# Patient Record
Sex: Female | Born: 1945 | Race: Black or African American | Hispanic: No | Smoking: Former smoker
Health system: Southern US, Community
[De-identification: ages and names within clinical notes are randomized; demographics above are authoritative.]

## PROBLEM LIST (undated history)

## (undated) DIAGNOSIS — I10 Essential (primary) hypertension: Secondary | ICD-10-CM

## (undated) DIAGNOSIS — E079 Disorder of thyroid, unspecified: Secondary | ICD-10-CM

## (undated) DIAGNOSIS — E119 Type 2 diabetes mellitus without complications: Secondary | ICD-10-CM

## (undated) DIAGNOSIS — E271 Primary adrenocortical insufficiency: Secondary | ICD-10-CM

## (undated) HISTORY — PX: EYE SURGERY: SHX253

---

## 1998-04-07 ENCOUNTER — Other Ambulatory Visit: Admission: RE | Admit: 1998-04-07 | Discharge: 1998-04-07 | Payer: Self-pay | Admitting: Ophthalmology

## 1998-11-02 ENCOUNTER — Emergency Department (HOSPITAL_COMMUNITY): Admission: EM | Admit: 1998-11-02 | Discharge: 1998-11-02 | Payer: Self-pay | Admitting: Emergency Medicine

## 1998-11-03 ENCOUNTER — Emergency Department (HOSPITAL_COMMUNITY): Admission: EM | Admit: 1998-11-03 | Discharge: 1998-11-03 | Payer: Self-pay | Admitting: Emergency Medicine

## 1998-11-04 ENCOUNTER — Encounter: Payer: Self-pay | Admitting: *Deleted

## 1998-11-04 ENCOUNTER — Ambulatory Visit (HOSPITAL_COMMUNITY): Admission: AD | Admit: 1998-11-04 | Discharge: 1998-11-05 | Payer: Self-pay | Admitting: *Deleted

## 1998-11-13 ENCOUNTER — Emergency Department (HOSPITAL_COMMUNITY): Admission: EM | Admit: 1998-11-13 | Discharge: 1998-11-13 | Payer: Self-pay | Admitting: Emergency Medicine

## 1998-11-16 ENCOUNTER — Emergency Department (HOSPITAL_COMMUNITY): Admission: EM | Admit: 1998-11-16 | Discharge: 1998-11-16 | Payer: Self-pay | Admitting: Emergency Medicine

## 2004-05-17 ENCOUNTER — Encounter (INDEPENDENT_AMBULATORY_CARE_PROVIDER_SITE_OTHER): Payer: Self-pay | Admitting: *Deleted

## 2004-05-17 ENCOUNTER — Inpatient Hospital Stay (HOSPITAL_COMMUNITY): Admission: EM | Admit: 2004-05-17 | Discharge: 2004-06-04 | Payer: Self-pay | Admitting: Emergency Medicine

## 2004-05-18 ENCOUNTER — Encounter (INDEPENDENT_AMBULATORY_CARE_PROVIDER_SITE_OTHER): Payer: Self-pay | Admitting: Cardiology

## 2004-09-28 ENCOUNTER — Inpatient Hospital Stay (HOSPITAL_COMMUNITY): Admission: EM | Admit: 2004-09-28 | Discharge: 2004-10-02 | Payer: Self-pay

## 2005-06-28 ENCOUNTER — Ambulatory Visit (HOSPITAL_COMMUNITY): Admission: RE | Admit: 2005-06-28 | Discharge: 2005-06-28 | Payer: Self-pay | Admitting: Ophthalmology

## 2007-08-23 ENCOUNTER — Encounter: Admission: RE | Admit: 2007-08-23 | Discharge: 2007-08-23 | Payer: Self-pay | Admitting: Family Medicine

## 2008-08-23 ENCOUNTER — Encounter: Admission: RE | Admit: 2008-08-23 | Discharge: 2008-08-23 | Payer: Self-pay | Admitting: Family Medicine

## 2009-08-25 ENCOUNTER — Encounter: Admission: RE | Admit: 2009-08-25 | Discharge: 2009-08-25 | Payer: Self-pay | Admitting: Family Medicine

## 2011-02-26 NOTE — Procedures (Signed)
CLINICAL INFORMATION:  This patient has a history of being in septic shock.  This is a portable EEG in an unresponsive patient.   TECHNICAL DESCRIPTION:  This EEG was recorded in an unresponsive patient and  is made primarily of muscle artifact and low voltage fast beta activity. Low  voltage fast beta activity is seen generally throughout this recording when  the artifact is not present. No well formed alpha. No theta or delta  activity is present. It is not clear whether the beta activity is reactive.  The patient was stimulated during this EEG, and there was no definite  evidence that the background activity reacted to the stimulation.   IMPRESSION:  This is an abnormal EEG with no normal awake background rhythms  present. The diffuse beta activity seems to be unresponsive to stimulation  during this recording. The findings above can be seen with multiple  etiologies including medication side effects, post ictal states. No focal  asymmetries and no definite epileptiform activity is seen.    Evie Lacks, M.D.   ZOX:WRUE  D:  05/19/2004 17:46:01  T:  05/20/2004 07:46:14  Job #:  [id number]

## 2011-02-26 NOTE — Consult Note (Signed)
NAMEAMEA, MCPHAIL                         ACCOUNT NO.:  [id number]   MEDICAL RECORD NO.:  [id number]                   PATIENT TYPE:  INP   LOCATION:  5524                                 FACILITY:  [facility]   PHYSICIAN:  Charlynne Pander, D.D.S.          DATE OF BIRTH:  1946-03-15   DATE OF CONSULTATION:  06/01/2004  DATE OF DISCHARGE:                                   CONSULTATION   [name] is a 65 year old female referred by Dr. Elliot Cousin for  dental consultation.  The patient was admitted previously in early August  with history of altered mental status.  The patient was subsequently found  to have sepsis and delirium secondary to the sepsis.  Dental consultation  has been requested to rule out dental infection as a possible source of the  sepsis at this time.   MEDICAL HISTORY:  1. Septic shock, unknown etiology.  2. History of delirium secondary to the sepsis.  3. The patient has acquired deafness.  4. Visual impairment, severe.  The patient is able to communicate via use of     word board with some lip reading involved.  5. History of hypothyroidism currently on Synthroid therapy.  6. History of ventilator dependent respiratory failure this admission.   ALLERGIES/ADVERSE DRUG REACTIONS:  None known.   MEDICATIONS:  1. Synthroid 75 mcg daily.  2. Isochromatophil 5% one drop in both eyes daily.  3. Prednisolone 1% drop in both eyes daily.  4. Colace 100 mg daily.  5. Nu Iron 150 mg daily.  6. Protonix 40 mg daily.  7. Magnesium hydroxide liquid 15 mL twice daily.  8. Senokot S one twice daily.  9. Aspirin 81 mg daily.  10.      Multivitamin daily.  11.      Jevity via tube per orders.  12.      Ensure 8 ounces three times daily.   SOCIAL HISTORY:  The patient lives alone and has remote history of tobacco  use.  The patient denies use of alcohol.   FAMILY HISTORY:  Was unable to be obtained at this time.   FUNCTIONAL ASSESSMENT:  The patient needs  assistance with both basic and  instrumental ADLs.  The patient's cognitive status and ability to give  informed consent appears to be impaired at this time.   REVIEW OF SYSTEMS:  Reviewed from the chart and health assessment form this  admission.   DENTAL HISTORY:   CHIEF COMPLAINT:  Dental consultation requested to rule out dental infection  as a source of the possible previous sepsis.   HISTORY OF PRESENT ILLNESS:  The patient was admitted with history of  altered mental status.  The patient subsequently felt to have septic shock  of unknown etiology and delirium secondary to the sepsis.  Dental  consultation requested to rule out dental infection as a source of the  etiology for the sepsis.   The patient  currently denies acute toothache, swellings or abscesses.  The  patient gives history of previous dental work with Dr. Beryl Meager.  Dr.  Beryl Meager has since passed away.  The patient indicates that she has full  intention of following up with the dentist of her choice for subsequent  dental care, but does not appear to wish to proceed with dental treatment at  this time.  The patient knows that she has several bad teeth that need to be  extracted and is possibly interested in implants although I do not feel the  patient would be indicated for this.  The patient does give history that she  can't wear dentures and therefore extraction of her remaining teeth  although ideal may not be truly beneficial for the patient from a functional  standpoint.  Removal of the remaining teeth would most likely assist in  prevention of further infection from dental etiology, however.  The patient  indicates that she had an upper complete dental, but was not able to wear  this successfully.  The patient has partial lower denture at this time.   DENTAL EXAM:  GENERAL:  The patient is a well-developed female in no acute  distress.  VITAL SIGNS:  Blood pressure is 111/69, pulse 88, respirations 20,   temperature 98.0 and afebrile.  HEENT:  There is no palpable lymphadenopathy.  The patient denies acute TMJ  symptoms.  INTRAORAL:  The patient's upper lip has a significant ulceration from  previous intubation.  This also has caused a traumatic ulceration to the  edentulous alveolar ridge of the premaxilla area secondary to this previous  intubation as well.  The patient has significant plaquing calculus  accumulations, but no significant abscesses are noted at this time.  PERIODONTAL:  The patient with chronic periodontal disease with plaquing  calculus accumulations, generalized gingival recession, generalized tooth  mobility, and moderate-to-severe horizontal-vertical bone loss.  DENTITION:  The patient is missing all of her upper teeth and has tooth  numbers 20 through 29 and 32 remaining.  DENTAL CARIES:  The patient has dental caries affecting the mandibular  anterior teeth.  I would need a full series of periapical radiographs to  identify the extent of the dental caries.  CROWN OR BRIDGE:  There are no crown or bridge restorations.  ENDODONTIC:  The patient currently acute pulpitis symptoms.  The patient may  have periapical pathology although with the mandible series it was difficult  to ascertain the true identity of periapical pathology.  PROSTHODONTICS:  The patient with history of upper complete denture which  was unable to be worn by the patient by her report.  OCCLUSION:  The patient with poor occlusal scheme at this time.   RADIOGRAPHIC INTERPRETATION:  Panoramic x-ray was ordered, but unable to be  taken.  Mandible series was substituted and is suboptimal.   The patient appears to be edentulous on the maxilla.  The patient has  chronic periodontal disease with moderate-to-severe bone loss. Tooth number  20 through 29 and 32 appear to be the only teeth present.  There may be periapical pathology associated with several teeth in the mouth although  this is very  difficult to ascertain due to the suboptimal nature of the  mandible series.   ASSESSMENT:  1. Traumatic ulceration of the upper lip consistent with the intubation tube     traumatizing the upper lip.  2. Traumatic ulceration of the premaxilla area also secondary to the     endotracheal tube present  during intubation.  3. Chronic periodontitis with bone loss.  4. Generalized gingival recession.  5. Insipid tooth mobility of the mandibular anterior areas and most likely     generalized phenomenon of the remaining teeth.  6. No history of acute pulpitis symptoms per patient report.  7. Possible periapical pathology is noted on the mandible series although     this is a suboptimal series.  8. History of oral neglect.  9. History of ill-fitting upper complete denture.  10.      Questionable need for antibiotic premedication prior to invasive     dental procedures.  11.      Need to ascertain the risk for bleeding due to the elevated     prothrombin time previously evaluated.   PLAN:  I discussed the risks, benefits and complications of various  treatment options with the patient in relationship to her medical and dental  conditions.  We discussed full mouth extractions with alveoloplasty as  indicated, no treatment, periodontal therapy, dental restoration, crown or  bridge therapy,  root canal therapy, implant therapy, and replacement of  missing teeth as indicated.  The patient currently indicates that she wishes  to defer all dental treatment at this time.  In light of the patient's  altered mental status I am unable to determine the patient's ability to give  informed consent at this time although the patient appears to be relatively  lucid at this time.  Most likely due to the patient's history of not being  able to wear dentures proceeding with full mouth extraction may or may not  be indicated at this time.  The patient does have chronic periodontal  disease and could ideally  benefit from full mouth extraction with  alveoloplasty to reduce the risk for potential etiology of dental infection  affecting the patient's systemic health.  Will need to discuss this further  with Dr. Elliot Cousin as indicated.  After the discussion, if the patient  and her family which to proceed with full mouth extraction, the oral surgeon  on call can be contacted to assist in provision of this dental care at this  time.                                               Charlynne Pander, D.D.S.    RFK/MEDQ  D:  06/01/2004  T:  06/01/2004  Job:  [id number]

## 2011-02-26 NOTE — Consult Note (Signed)
[name], [name]                         ACCOUNT NO.:  [id number]   MEDICAL RECORD NO.:  [id number]                   PATIENT TYPE:  INP   LOCATION:  2105                                 FACILITY:  [facility]   PHYSICIAN:  Deanna Artis. Sharene Skeans, M.D.           DATE OF BIRTH:  1946/09/26   DATE OF CONSULTATION:  05/18/2004  DATE OF DISCHARGE:                                   CONSULTATION   REFERRING PHYSICIAN:  Dr. Renato Tapia.   CHIEF COMPLAINT:  Altered mental status.   HISTORY OF THE PRESENT CONDITION:  The patient is a 65 year old woman with  known hypothyroidism, on thyroid replacement, who had altered mental status  with somnolence and confusion.  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  Nonetheless, the patient has been living alone and has some light  perception and is able to communicate.   The patient was able to move all 4 extremities.  She was not incontinent of  urine.  She had some vomiting, but no signs of aspiration or respiratory  distress.  The patient had not shown any signs of seizures.   She was brought to [facility], where she was assessed by Dr.  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  She had a temperature of  103.1.  Attempts were made to perform a lumbar puncture on the patient but  were unsuccessful until the patient was heavily sedated, at which point she  showed 4 white blood cells, 171 red blood cells, rare neutrophils,  lymphocytes.  Glucose and protein unfortunately have not yet been done.   The patient showed markedly elevated white count of 15,900 with absolute  neutrophil count of 11,600.  She had hypokalemia with a potassium of 3.1,  slightly elevated creatinine of 1.3, negative urinalysis, negative chest x-  ray, CT scan of the brain which was basically normal other than lytic lesion  in the right frontal region that was ovoid and about 1  cm in long diameter.   Her EKG was also unremarkable.  I was asked to see her because of her  persistent altered mental status with regards to the etiology of her  dysfunction.  She has been seen by Dr. Marcelyn Bruins of the critical care  service, who felt that she has signs of septic shock.  She has recently had  a central line placed under Versed, fentanyl and Norcuron, and is  essentially not able to be evaluated from a neurologic perspective at this  time.   SOCIAL HISTORY:  The patient has a remote history of tobacco use.  She  denies alcohol or drugs.  She worked previously in a factory.   FAMILY HISTORY:  Family history was not able to be obtained.   CURRENT MEDICATIONS:  1. Synthroid 100 mcg daily.  2. Isopto Homatropine 5% -- 1 drop in each  eye daily.  3. Prednisolone acetate 1 % -- 1 drop in each eye daily.   REVIEW OF SYSTEMS:  Review of systems cannot be obtained.  The patient has  cataracts, possible glaucoma.   PHYSICAL EXAMINATION:  VITAL SIGNS:  On examination today, her blood  pressure has varied wildly from 60/25, the low, to 110/70; currently, it is  145/70 after the fluids, albumin and dopamine drip were started.  Her I&O is  +3901 for the day (5 p.m.).  EARS, NOSE AND THROAT:  No signs of infection.  NECK:  I cannot test for meningismus very easily because of her intubation.  LUNGS:  Lungs clear.  HEART:  No murmurs.  Pulses normal.  BREASTS:  Breast exam per Dr Laurell Roof, no masses palpated.  ABDOMEN:  Abdomen soft, bowel sounds normal, no hepatosplenomegaly.  EXTREMITIES:  No edema or cyanosis.  Her extremities are warm and pink.  NEUROLOGICAL EXAMINATION:  The patient is in a sedated stupor and also  paralyzed.  Her pupils are unreactive and are scarred.  I cannot see the  fundi.  She is functionally blind, functionally death.  She does not show a  grimace at this time to pain.  She has no gag or corneal response.  She had  spastic quadriplegia,  areflexic.  She had no response to plantar  stimulation.   IMPRESSION:  1. Delirium, 293.0.  I believe that this may be related to septic shock.  I     do not think that the patient has a true meningoencephalitis.  2. Acquired deafness.  3. Acquired blindness.  4. I doubt meningitis or encephalitis, although I cannot rule out the latter     on the basis of the lumbar puncture formula, but the patient has had no     seizures.  I do not think the patient has had a stroke because of her     ability to move all 4 extremities.  I suspect a toxic delirium with     febrile illness and possible sepsis.  5. The patient cannot be adequately examined at this time and I will have to     assess her later.  She was moving all 4 extremities before she was     sedated, but she was not communicating well.   RECOMMENDATIONS:  1. EEG.  2. Consider bone scan to evaluate the lytic lesion and see if there are     other bony lytic lesions.  Possibilities would include myeloma or     leukemia.  3. I await the cultures.  I would treat with broad-spectrum antibiotics at     this time; I do not believe she needs antiviral agents.  4. I will examine her when her medications wear off.   I appreciate the opportunity to participate in her care.  If you have  questions, do not hesitate to contact me.                                               Deanna Artis. Sharene Skeans, M.D.    [facility]  D:  05/18/2004  T:  05/19/2004  Job:  [id number]   cc:   Marcelyn Bruins, M.D. [facility]   [name], M.D.  Fax: [phone]

## 2011-02-26 NOTE — H&P (Signed)
NAMEMEEYA, GOLDIN                         ACCOUNT NO.:  [id number]   MEDICAL RECORD NO.:  [id number]                   PATIENT TYPE:  INP   LOCATION:  1827                                 FACILITY:  [facility]   PHYSICIAN:  Hillery Aldo, M.D.                DATE OF BIRTH:  September 30, 1946   DATE OF ADMISSION:  05/17/2004  DATE OF DISCHARGE:                                HISTORY & PHYSICAL   CHIEF COMPLAINT:  Altered mental status.   HISTORY OF PRESENT ILLNESS:  The patient is a 65 year old female of Dr.  Marcelino Freestone. McKenzie's with a past medical history of hypothyroidism, who was  in her usual state of health when last seen by her son 2 night prior to  admission.  The patient's son stated that he visited her this afternoon and  found her somnolent and difficult to arouse.  She had garbled speech and was  confused, and unable to answer questions normally.  Notably, the patient is  deaf at baseline and has a severe visual impairment, but according to her  son, is normally able to communicate with a combination of sign language,  lip-reading, and using written words on a dry-erase board.  The patient  lives alone and relies on her family to help with grocery shopping, etc.  It  was not felt by her son that she had any evidence of paralysis and he did  not notice that she was incontinent of bladder or bowel.  Son did report  that she may have had some vomiting, as he found evidence of this in the  bathroom.   PAST MEDICAL HISTORY:  1. Hypothyroidism.  2. Deafness.  3. 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   SOCIAL HISTORY:  The patient lives alone.  She has a remote history of  tobacco.  Her son denies that she uses any alcohol or drugs.  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   FAMILY HISTORY:  Unable to obtain and the patient's son is not able to  elucidate.   ALLERGIES:  No known drug allergies.   CURRENT MEDICATIONS:  1. Synthroid 100 mcg daily.  2. Isopto Homatropine 5% 1 drop in each eye daily.  3. Prednisolone acetate 1% 1 drop in each eye daily.   REVIEW OF SYSTEMS:  Unable to obtain.   INITIAL LABORATORY DATA:  Chemistries showed a sodium of 135, potassium 3.1,  chloride 101, bicarb 25, BUN 9, creatinine 1.3, glucose 105, calcium 8.7,  total protein 6.9, albumin 3.2, AST 38, ALT 17, alkaline phosphatase 89,  total bilirubin 0.6.  CBC showed a white blood cell count of  15.9,  hemoglobin 11.7, hematocrit of 35.5, platelet count of 225,000 with an MCV  of 73.7 and an absolute neutrophil count of 11.6.  Urinalysis was negative  for ketones, nitrites, leukocyte esterase, and glucose.   Chest x-ray was clear with no active disease process.   CT of the head showed a 1-cm lesion in the right frontal bone that was  thought to be a possible metastasis from an unknown primary, otherwise, no  acute changes.   EKG showed normal sinus rhythm at 100 beats per minute with a slightly  prolonged Q-T interval of 559 msec.  There were no ST changes.   PHYSICAL EXAM:  VITAL SIGNS:  Temperature 103.1, heart rate 100, blood  pressure 89/41, respiratory rate 16.  GENERAL:  Well-developed, well-nourished female who is somnolent and unable  to respond to questioning.  HEENT:  Normocephalic, atraumatic.  Pupils unreactive secondary to  underlying blindness with opacity of the lenses consistent with cataracts.  Oropharynx is clear.  NECK:  There is some stiffness and nuchal rigidity.  Brudzinski sign is  negative.  CHEST:  Lungs clear to auscultation bilaterally with snoring-type  respirations.  HEART:  Tachycardic but regular.  No murmurs, rubs, or gallops.  ABDOMEN:  Soft, no hepatosplenomegaly or masses.  EXTREMITIES:  No clubbing, edema, or  cyanosis.  NEUROLOGICAL:  The patient is somnolent but withdraws to pain.  She moves  all extremities in a restless manner spontaneously.  Unable to elicit a  Babinski on either foot.   ASSESSMENT AND PLAN:  Acute mental status changes in the setting of fever  and leukocytosis:  Differential diagnosis includes meningitis versus  encephalitis.  The emergency department physician was unable to get  cerebrospinal fluid from a spinal tap secondary to patient agitation.  Patient was unable to tolerate an attempt to get an lumbar puncture under  fluoroscopy secondary to restlessness.  She had been given Versed as well as  Ativan to help calm her, but this did not calm her sufficiently to be able  to get the lumbar puncture.  The patient was therefore intubated and  paralyzed so that the lumbar puncture could be obtained.  The cerebrospinal  fluid was sent for cell count, differential, cytology, cultures,  chemistries, and PCR for herpes simplex virus and West Nile virus as well as  VDRL.  Additionally, we will check a thyroid-stimulating hormone, given her  history of hypothyroidism, although this is not likely secondary to her  thyroid disease.  She did have a prescription bottle with her and the  appropriate number of pills left in the bottle.  We will also check cardiac  markers and a D-dimer to rule out acute myocardial infarction/acute coronary  syndrome and pulmonary embolus.  If the above studies are unrevealing, she  may need an MRI of the head to further elucidate any intracranial  abnormalities.  She is being treated empirically with vancomycin, Rocephin  to cover meningococcus as well as acyclovir to cover herpes simplex virus.  Additionally, we will start her on a proton pump inhibitor and Lovenox for  gastrointestinal and deep venous thrombosis prophylaxis, respectively.                                                Hillery Aldo, M.D.   CR/MEDQ  D:  05/18/2004  T:   05/18/2004  Job:  [id number]   cc:   Lorelle Formosa, M.D.  [phone] [address]  [place]  [place] [address]  Fax: [phone]

## 2011-02-26 NOTE — Discharge Summary (Signed)
[name], [name]                         ACCOUNT NO.:  [id number]   MEDICAL RECORD NO.:  [id number]                   PATIENT TYPE:  INP   LOCATION:  5524                                 FACILITY:  [facility]   PHYSICIAN:  [name], M.D.                 DATE OF BIRTH:  [birth year]   DATE OF ADMISSION:  05/17/2004  DATE OF DISCHARGE:  06/04/2004                                 DISCHARGE SUMMARY   DISCHARGE DIAGNOSES:  1. Altered mental status with fever and leukocytosis.  2. Elective intubation.  3. Septic shock (etiology unclear, cultures negative).  4. Hypernatremia.  5. Normocytic anemia with guaiac positive stool.  Status post four units     packed red blood cells transfusion.  6. Small left acute occipital stroke per MRI of the brain on May 21, 2004.  7. Hypothyroidism.  8. Dental cares/cavities with chronic peritonitis, with bone loss.  Gingival     recession.  9. Traumatic upper lip ulceration and traumatic ulceration of the premaxilla     area secondary to intubation.  10.      Deaf.  11.      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  12.      Glaucoma.   DISCHARGE MEDICATIONS:  1. Synthroid 75 mcg daily.  2. Nu-Iron one tablet daily.  3. Aspirin 81 mg daily.  4. Isopto homatropine 5% one drop in each eye daily.  5. Prednisolone acetate eye drop 1% one drop in each eye daily.  6. Pepcid over-the-counter one pill daily.  7. Ensure one can (8 ounces) 2-3 times daily as needed.  8. Centrum Silver vitamin one tablet daily.   DISPOSITION:  The anticipate date of discharge will be on June 04, 2004.  The patient has a follow up appointment with primary care physician, Dr.  Ronne Tapia on Thursday, September 1, at 10 a.m.   CONSULTATIONS:  1. [name].  2. [name], M.D.  3. [name].   PROCEDURES:  1. Elective intubation and ventilation on May 17, 2004,  extubated May 25, 2004.  2. Right subclavian central line.  3. Femoral arterial line.  4. Fluoroscopic guided lumbar puncture on May 17, 2004.  5. CT scan of the head without contrast on May 17, 2004.  The results     revealed no acute abnormality in the brain.  Right frontal bone lesion.  6. MRI of the brain on May 21, 2004.  Acute left occipital stroke.  Brain     otherwise negative.  The right frontal calvarial abnormality seen on the     CT is most consistent with a hemangioma.  7. CT scan of the abdomen and pelvis on May 20, 2004.  The results     revealed small bilateral pleural effusions, moderate right hydronephrosis  and right uteroectasis.  Negative for renal stones. Vascular     calcifications. Ultrasound of the abdomen on May 22, 2004.  Limited     visualization of the aorta.  Gallbladder within normal limits.  Right     hydronephrosis.  The right kidney measures 10.3 cm, and the left kidney     measures 9.8 cm.  8. Bilateral lower extremity venous Dopplers.  Negative for DVT bilaterally.  9. A 2D echocardiogram on May 18, 2004.  The results revealed overall left     ventricular systolic function was hyperdynamic.  Ejection fraction     estimated at 75-85%.  The study was inadequate for evaluation of the left     ventricular regional wall motion.  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   HISTORY OF PRESENT ILLNESS:  The patient is a 65 year old lady with a past  medical history significant for hypothyroidism who was in her usual state of  health when last seen by her two sons the night prior to admission.  The  patient's son stated that he had visited his mother during the afternoon and  found her somnolent and difficult to arouse.  She had garbled speech and was  confused.  She was also unable to answer questions normally.  Notably, the  patient is deaf at baseline and has a severe visual impairment,  but  according to her son, she is normally able to communicate with a combination  of sign language, lip reading and using written words on a dry erase board.  The patient lives alone and relies on her family to help with grocery  shopping, etc.  It was felt by her son that she had not had any evidence of  paralysis or seizure activity.  The son did report that the patient did have  some vomiting as he found evidence in the bathroom on the date in question.   HOSPITAL COURSE:  #1 - ALTERED MENTAL STATUS WITH FEVER AND LEUKOCYTOSIS.  The patient's question per Dr. Darnelle Tapia, the hospitalist who admitted Mrs.  Tapia, the patient was somewhat somnolent on exam initially.  The patient  was moving all of her extremities.  However, they were in a restless manner.  It was noted that the patient had a temperature of 103.1.  Her white blood  cell count was 15.9.  Given these findings, meningitis versus encephalitis  were entertained as the differential diagnoses.  The patient was sent for an  LP; however, she was too agitated.  The patient was therefore sedated and  paralyzed and electively intubated and ventilated for the LP.  The patient  was subsequently sent to radiology for a fluoroscopic lumbar puncture.  Prior to the lumbar puncture, the patient was evaluated with a CT scan of  the head while in the emergency department.  The CT scan of the head  revealed no acute abnormality.  However, there was a question of the right  frontal bone lesion.  Studies were sent on the cerebral spinal fluid.  Blood  cultures were also ordered.  The patient was started on empiric treatment  with vancomycin and Rocephin.  She was also started empirically on acyclovir  to cover herpes simplex virus.  She was started on prophylactic Protonix and  Lovenox.  Additional studies were ordered including a 2D echocardiogram and bilateral lower extremity Doppler studies.  The 2D echocardiogram revealed  that the patient had  a hyperdynamic left  ventricular systolic function with  an estimated ejection fraction ranging between 75-85%.  The bilateral lower  extremity Doppler studies were normal for DVT.  It was also noted that the  patient's D. dimer was elevated.  A CT scan of the chest was ordered from  the emergency department.  The CT scan was negative for PE.  Over the  following 24-48 hours, the patient's systolic blood pressure fell into the  60's.  The patient was resuscitated with normal saline and dopamine.  The  Critical Care Medicine Tapia was consulted for further evaluation and  management.  The Critical Care Medicine Tapia made adjustments on the vent.  They also ordered additional studies.  The results of the cerebrospinal  fluid were as follows.  Gram stain:  No organisms.  CSF culture:  No growth.  CSF VDRL nonreactive, herpes simplex PCR negative, CSF WBC 4, CSF segmented  neutrophils are rare.  Additional studies revealed that a serum TPPA was  reactive, but the cerebrospinal fluid, VDRL, was negative.  The patient's  TSH was assessed and found to be reduced at 0.008.  However, the free T4 was  within normal limits at 0.96.  Nevertheless, the patient's Synthroid dose  was decreased to 75 mcg daily.  She was given the IV equivalent of 75 mcg  daily as recommended by the pharmacy.  A West Nile Virus antibody was also  ordered.  The West Nile antibody IgG and IgM on the CSF was negative.  Over  the 8 days that the patient was intubated, multiple studies were performed  to investigate the patient's apparent septic shock.  A CT scan of the  abdomen and pelvis revealed no intraabdominal and no intrapelvic sources of  infection.  The ultrasound revealed that the gallbladder was within normal  limits, although, there was mild right hydronephrosis.  Blood cultures were  ordered again during the hospital course as well as respiratory cultures.  They remained negative.  A urinalysis and urine culture were  also ordered,  and they too remained negative.   Neurologist, Dr. Sharene Skeans, was consulted for further evaluation and  management.  He felt that the patient had toxic encephalopathy, although, he  doubted meningitis or encephalitis.   The Rocephin was discontinued, and the patient was started on Zosyn shortly  after intubation.  After several days of Zosyn, the patient's fevers  improved.  She remained on vancomycin and acyclovir.  The Critical Care  physicians began the vent weaning protocol.  The patient was successfully  extubated on May 25, 2004.  The vancomycin, acyclovir and Zosyn were  eventually discontinued.  The patient tolerated the extubation well.  The  patient currently has no respiratory sequelae from the intubation with  exception from a traumatic upper lip injury and ulceration.  This area is  now healing.  The cause of the patient's encephalopathy and septic shock are unknown.  The potential source of infection was the patient's severe  periodontal disease with chronic dental cares and chronic cavities.  Dentist, Dr. Kristin Bruins, was consulted for further evaluation.  Per Dr.  Kristin Bruins, the patient certainly needed to have all of her teeth pulled.  However, the patient did not agree to such treatment.  The family wanted to  defer all dental treatment during the hospitalization.   #2 - LEFT ACUTE OCCIPITAL STROKE.  During the evaluation and management of  the patient's septic shock and altered mental status, an MRI of the brain  was ordered per the recommendation  of the neurologist, Dr. Sharene Skeans.  MRI of  the brain revealed an acute left occipital stroke.  Otherwise, the MRI was  negative. Dr. Sharene Skeans did not believe that the patient's encephalopathy was  secondary to the acute stroke.  He thought that the patient's encephalopathy  was in part secondary to the sepsis.  The patient was started on a baby  aspirin after she was extubated.  She apparently has no worsening  vision.  The patient is chronically visually impaired.  The lesion seen on the  frontal bone on the CT scan of the head was actually a probable hemangioma  per the MRI of the brain and head.   #3 - HYPERNATREMIA.  During the time that the patient was on the ventilator,  her sodium levels increased to 162.  She was subsequently treated with free  water for several days.  The hypernatremia resolved.   #4 - NORMOCYTIC ANEMIA WITH GUAIAC POSITIVE STOOLS.  The patient's  hemoglobin on admission was 12.5.  However, following admission, the  patient's hemoglobin fell to 7.4 during the first three days.  The patient  was typed and crossed two units of packed red blood cells and transfused  while she was on the ventilator.  The patient's hemoglobin increased to 9.1  following transfusions.  Following extubation and transfer to the floor, the  patient's hemoglobin fell again to 9.6.  The patient was typed and crossed  two more units and transfused.  Her stool was tested for microscopic blood  and was found to be guaiac positive.  The patient's iron studies revealed  ferritin of 342, RBC folate of 669 and a vitamin B-12 of greater than 2000.  The patient was started on a multivitamin with iron and Nu-Iron once daily.  She was continued on Protonix 40 mg daily empirically.  A consultation was  obtained from a gastroenterologist.  I cannot locate the name of the  gastroenterologist who actually evaluated the patient.  However, from  his/her notes, the patient refused a colonoscopy which was recommended.  Further evaluation and management per her primary care physician, Dr.  Ronne Tapia.   #5 - THE PATIENT IS CURRENTLY STABLE AND IN IMPROVED CONDITION.  She is  mildly deconditioned.  However, her strengthening has improved.  The patient  may require Home Health physical therapy.  The family desires to take the  patient home with family assistance.                                                [name], M.D.    DF/MEDQ  D:  06/03/2004  T:  06/04/2004  Job:  [id number]   cc:   Lorelle Formosa, M.D.  [phone] [address]. [address]  [place]  [place] [address]  Fax: [phone]

## 2011-02-26 NOTE — Op Note (Signed)
NAMECONSTANZA, [name]             ACCOUNT NO.:  [id number]   MEDICAL RECORD NO.:  [id number]          PATIENT TYPE:  AMB   LOCATION:  SDS                          FACILITY:  [facility]   PHYSICIAN:  Salley Scarlet., M.D.DATE OF BIRTH:  1946-02-18   DATE OF PROCEDURE:  08/14/2005  DATE OF DISCHARGE:  06/28/2005                                 OPERATIVE REPORT   PREOPERATIVE DIAGNOSIS:  Immature cataract, right eye.   POSTOPERATIVE DIAGNOSIS:  Immature cataract, right eye.   OPERATION/PROCEDURE:  Extracapsular cataract extraction with intraocular  lens implantation.   ANESTHESIA:  Local using Xylocaine 2%, Marcaine 0.75% and Wydase.   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  She has been followed for some time for  progressive cataract formation and for control of the chronic uveitis.  The  cataract has progressed on the right eye to the point that she has extreme  difficulty seeing to get around.  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  She had the risks and benefits  carefully explained to her as it relates to cataract surgery in her only  good eye.  She seemed to understand and requested the cataract be removed.  She is, therefore, admitted at this time for cataract extraction of the  right eye under local anesthesia.  It is important to note that she has also  been followed at [facility] and that she had a cataract removed from the left eye  along with a vitrectomy which resulted in the phthisis.   DESCRIPTION OF PROCEDURE:  Under the influence of IV sedation, Van Lint  akinesia and retrobulbar anesthesia were given.  The patient was prepped and  draped in the usual manner.  The lid speculum was inserted under the upper  and lower lids of the right eye and a 4-0 silk traction suture was fastened  to the  belly of the superior rectus muscle for traction.  A fornix-based  conjunctival flap was turned and hemostasis was achieved by using cautery.   An groove incision was made in the sclera at the limbus for 110 degrees and  anterior chamber was entered through the groove incision at the 11:30  o'clock position using Superblade.  An anterior capsulotomy was done using a  bent 25-gauge needle.  The corneoscleral wound was extended first toward the  left and then toward the right placing a single 8-0 Vicryl suture across  each arm of the incision as it was made toward the left and then toward the  right.  Attempts were then made to express the nucleus but the sclera  literally collapsed like a shrunken grape and the nucleus began to fall into  the posterior chamber.  We were able to retrieve the nucleus with a moderate  amount of difficulty.  An anterior vitrectomy was done using the vitrector.  The residual cortical material was aspirated as much as we could aspirate.  A peripheral  iridectomy was made in the iris.  An anterior chamber lens was  seated across the pupil into the chamber angle.  The corneoscleral wound was  closed using a combination of interrupted sutures of 8-0 Vicryl and 10-0  nylon.  After it was ascertained that the wound was airtight and watertight,  the conjunctiva was closed using thermocautery.  One mL of Celestone, 0.5 ml  of gentamicin were injected subconjunctivally.  Maxitrol ophthalmic ointment  and Atropine were applied along with a patch and Fox shield.  The patient  tolerated the procedure well and was discharged to the post anesthesia care  unit room in satisfactory condition.   She was instructed to rest today, to take Vicodin every four hours as needed  for pain and see me in the office tomorrow for further evaluation.   DISCHARGE DIAGNOSIS:  Immature cataract, right eye.      Salley Scarlet., M.D.  Electronically Signed     TB/MEDQ  D:   08/14/2005  T:  08/15/2005  Job:  [id number]

## 2016-05-05 DIAGNOSIS — E039 Hypothyroidism, unspecified: Secondary | ICD-10-CM | POA: Diagnosis not present

## 2016-05-05 DIAGNOSIS — E119 Type 2 diabetes mellitus without complications: Secondary | ICD-10-CM | POA: Diagnosis not present

## 2016-05-05 DIAGNOSIS — D699 Hemorrhagic condition, unspecified: Secondary | ICD-10-CM | POA: Diagnosis not present

## 2016-05-05 DIAGNOSIS — E236 Other disorders of pituitary gland: Secondary | ICD-10-CM | POA: Diagnosis not present

## 2016-05-05 DIAGNOSIS — D649 Anemia, unspecified: Secondary | ICD-10-CM | POA: Diagnosis not present

## 2016-05-05 DIAGNOSIS — I10 Essential (primary) hypertension: Secondary | ICD-10-CM | POA: Diagnosis not present

## 2017-05-24 DIAGNOSIS — D699 Hemorrhagic condition, unspecified: Secondary | ICD-10-CM | POA: Diagnosis not present

## 2017-05-24 DIAGNOSIS — E039 Hypothyroidism, unspecified: Secondary | ICD-10-CM | POA: Diagnosis not present

## 2017-05-24 DIAGNOSIS — E119 Type 2 diabetes mellitus without complications: Secondary | ICD-10-CM | POA: Diagnosis not present

## 2017-05-24 DIAGNOSIS — I10 Essential (primary) hypertension: Secondary | ICD-10-CM | POA: Diagnosis not present

## 2017-05-24 DIAGNOSIS — E236 Other disorders of pituitary gland: Secondary | ICD-10-CM | POA: Diagnosis not present

## 2017-08-26 DIAGNOSIS — B5801 Toxoplasma chorioretinitis: Secondary | ICD-10-CM | POA: Diagnosis not present

## 2017-08-26 DIAGNOSIS — H541225 Low vision right eye category 2, blindness left eye category 5: Secondary | ICD-10-CM | POA: Diagnosis not present

## 2017-08-26 DIAGNOSIS — H16223 Keratoconjunctivitis sicca, not specified as Sjogren's, bilateral: Secondary | ICD-10-CM | POA: Diagnosis not present

## 2017-08-26 DIAGNOSIS — H169 Unspecified keratitis: Secondary | ICD-10-CM | POA: Diagnosis not present

## 2017-09-22 DIAGNOSIS — H541225 Low vision right eye category 2, blindness left eye category 5: Secondary | ICD-10-CM | POA: Diagnosis not present

## 2017-09-22 DIAGNOSIS — H169 Unspecified keratitis: Secondary | ICD-10-CM | POA: Diagnosis not present

## 2017-09-22 DIAGNOSIS — H16223 Keratoconjunctivitis sicca, not specified as Sjogren's, bilateral: Secondary | ICD-10-CM | POA: Diagnosis not present

## 2017-09-22 DIAGNOSIS — B5801 Toxoplasma chorioretinitis: Secondary | ICD-10-CM | POA: Diagnosis not present

## 2017-12-13 DIAGNOSIS — H541225 Low vision right eye category 2, blindness left eye category 5: Secondary | ICD-10-CM | POA: Diagnosis not present

## 2017-12-13 DIAGNOSIS — B5801 Toxoplasma chorioretinitis: Secondary | ICD-10-CM | POA: Diagnosis not present

## 2017-12-13 DIAGNOSIS — H16223 Keratoconjunctivitis sicca, not specified as Sjogren's, bilateral: Secondary | ICD-10-CM | POA: Diagnosis not present

## 2018-01-19 ENCOUNTER — Ambulatory Visit: Payer: Self-pay | Admitting: Physician Assistant

## 2018-02-01 ENCOUNTER — Encounter (HOSPITAL_COMMUNITY): Payer: Self-pay

## 2018-02-01 ENCOUNTER — Other Ambulatory Visit: Payer: Self-pay

## 2018-02-01 ENCOUNTER — Emergency Department (HOSPITAL_COMMUNITY): Payer: Medicare Other

## 2018-02-01 ENCOUNTER — Inpatient Hospital Stay (HOSPITAL_COMMUNITY)
Admission: EM | Admit: 2018-02-01 | Discharge: 2018-02-06 | DRG: 480 | Disposition: A | Discharge status: Skilled Nursing Facility | Payer: Medicare Other | Attending: Family Medicine | Admitting: Family Medicine

## 2018-02-01 DIAGNOSIS — R778 Other specified abnormalities of plasma proteins: Secondary | ICD-10-CM | POA: Diagnosis not present

## 2018-02-01 DIAGNOSIS — N179 Acute kidney failure, unspecified: Secondary | ICD-10-CM | POA: Diagnosis present

## 2018-02-01 DIAGNOSIS — Z419 Encounter for procedure for purposes other than remedying health state, unspecified: Secondary | ICD-10-CM

## 2018-02-01 DIAGNOSIS — S72352A Displaced comminuted fracture of shaft of left femur, initial encounter for closed fracture: Secondary | ICD-10-CM | POA: Diagnosis not present

## 2018-02-01 DIAGNOSIS — E872 Acidosis: Secondary | ICD-10-CM | POA: Diagnosis present

## 2018-02-01 DIAGNOSIS — S199XXA Unspecified injury of neck, initial encounter: Secondary | ICD-10-CM | POA: Diagnosis not present

## 2018-02-01 DIAGNOSIS — E2740 Unspecified adrenocortical insufficiency: Secondary | ICD-10-CM | POA: Diagnosis not present

## 2018-02-01 DIAGNOSIS — E876 Hypokalemia: Secondary | ICD-10-CM | POA: Diagnosis present

## 2018-02-01 DIAGNOSIS — D62 Acute posthemorrhagic anemia: Secondary | ICD-10-CM | POA: Diagnosis not present

## 2018-02-01 DIAGNOSIS — E119 Type 2 diabetes mellitus without complications: Secondary | ICD-10-CM | POA: Diagnosis present

## 2018-02-01 DIAGNOSIS — W1830XA Fall on same level, unspecified, initial encounter: Secondary | ICD-10-CM | POA: Diagnosis present

## 2018-02-01 DIAGNOSIS — Z7984 Long term (current) use of oral hypoglycemic drugs: Secondary | ICD-10-CM

## 2018-02-01 DIAGNOSIS — S0990XA Unspecified injury of head, initial encounter: Secondary | ICD-10-CM | POA: Diagnosis not present

## 2018-02-01 DIAGNOSIS — M79605 Pain in left leg: Secondary | ICD-10-CM | POA: Diagnosis not present

## 2018-02-01 DIAGNOSIS — R51 Headache: Secondary | ICD-10-CM | POA: Diagnosis not present

## 2018-02-01 DIAGNOSIS — H9190 Unspecified hearing loss, unspecified ear: Secondary | ICD-10-CM | POA: Diagnosis present

## 2018-02-01 DIAGNOSIS — T1490XA Injury, unspecified, initial encounter: Secondary | ICD-10-CM

## 2018-02-01 DIAGNOSIS — M542 Cervicalgia: Secondary | ICD-10-CM | POA: Diagnosis not present

## 2018-02-01 DIAGNOSIS — R945 Abnormal results of liver function studies: Secondary | ICD-10-CM

## 2018-02-01 DIAGNOSIS — D696 Thrombocytopenia, unspecified: Secondary | ICD-10-CM | POA: Diagnosis present

## 2018-02-01 DIAGNOSIS — R7989 Other specified abnormal findings of blood chemistry: Secondary | ICD-10-CM

## 2018-02-01 DIAGNOSIS — I10 Essential (primary) hypertension: Secondary | ICD-10-CM | POA: Diagnosis present

## 2018-02-01 DIAGNOSIS — E860 Dehydration: Secondary | ICD-10-CM

## 2018-02-01 DIAGNOSIS — D649 Anemia, unspecified: Secondary | ICD-10-CM | POA: Diagnosis present

## 2018-02-01 DIAGNOSIS — S299XXA Unspecified injury of thorax, initial encounter: Secondary | ICD-10-CM | POA: Diagnosis not present

## 2018-02-01 DIAGNOSIS — T148XXA Other injury of unspecified body region, initial encounter: Secondary | ICD-10-CM | POA: Diagnosis not present

## 2018-02-01 DIAGNOSIS — Y92009 Unspecified place in unspecified non-institutional (private) residence as the place of occurrence of the external cause: Secondary | ICD-10-CM

## 2018-02-01 DIAGNOSIS — E039 Hypothyroidism, unspecified: Secondary | ICD-10-CM | POA: Diagnosis present

## 2018-02-01 DIAGNOSIS — S7292XA Unspecified fracture of left femur, initial encounter for closed fracture: Secondary | ICD-10-CM | POA: Diagnosis present

## 2018-02-01 DIAGNOSIS — Z79899 Other long term (current) drug therapy: Secondary | ICD-10-CM

## 2018-02-01 DIAGNOSIS — I21A1 Myocardial infarction type 2: Secondary | ICD-10-CM | POA: Diagnosis not present

## 2018-02-01 HISTORY — DX: Essential (primary) hypertension: I10

## 2018-02-01 HISTORY — DX: Type 2 diabetes mellitus without complications: E11.9

## 2018-02-01 LAB — BASIC METABOLIC PANEL
ANION GAP: 7 (ref 5–15)
BUN: 13 mg/dL (ref 6–20)
CHLORIDE: 118 mmol/L — AB (ref 101–111)
CO2: 16 mmol/L — AB (ref 22–32)
Calcium: 6.6 mg/dL — ABNORMAL LOW (ref 8.9–10.3)
Creatinine, Ser: 0.79 mg/dL (ref 0.44–1.00)
GFR calc Af Amer: >60 mL/min (ref >60)
GFR calc non Af Amer: >60 mL/min (ref >60)
Glucose, Bld: 95 mg/dL (ref 65–99)
POTASSIUM: 2.8 mmol/L — AB (ref 3.5–5.1)
Sodium: 141 mmol/L (ref 135–145)

## 2018-02-01 LAB — HEPATIC FUNCTION PANEL
ALT: 6 U/L — AB (ref 14–54)
AST: 21 U/L (ref 15–41)
Albumin: 2.5 g/dL — ABNORMAL LOW (ref 3.5–5.0)
Alkaline Phosphatase: 37 U/L — ABNORMAL LOW (ref 38–126)
BILIRUBIN DIRECT: 0.2 mg/dL (ref 0.1–0.5)
BILIRUBIN INDIRECT: 0.5 mg/dL (ref 0.3–0.9)
Total Bilirubin: 0.7 mg/dL (ref 0.3–1.2)
Total Protein: 4.5 g/dL — ABNORMAL LOW (ref 6.5–8.1)

## 2018-02-01 LAB — CK: CK TOTAL: 98 U/L (ref 38–234)

## 2018-02-01 LAB — CBC
HEMATOCRIT: 25.1 % — AB (ref 36.0–46.0)
Hemoglobin: 8.4 g/dL — ABNORMAL LOW (ref 12.0–15.0)
MCH: 26.3 pg (ref 26.0–34.0)
MCHC: 33.5 g/dL (ref 30.0–36.0)
MCV: 78.4 fL (ref 78.0–100.0)
PLATELETS: 115 10*3/uL — AB (ref 150–400)
RBC: 3.2 MIL/uL — AB (ref 3.87–5.11)
RDW: 14.2 % (ref 11.5–15.5)
WBC: 9.4 10*3/uL (ref 4.0–10.5)

## 2018-02-01 LAB — I-STAT TROPONIN, ED: TROPONIN I, POC: 0.55 ng/mL — AB (ref 0.00–0.08)

## 2018-02-01 LAB — MAGNESIUM: MAGNESIUM: 1.5 mg/dL — AB (ref 1.7–2.4)

## 2018-02-01 LAB — PROTIME-INR
INR: 0.95
Prothrombin Time: 12.5 seconds (ref 11.4–15.2)

## 2018-02-01 MED ORDER — POTASSIUM CHLORIDE 10 MEQ/100ML IV SOLN
10.0000 meq | INTRAVENOUS | Status: AC
  Administered 2018-02-02 (×2): 10 meq via INTRAVENOUS
  Filled 2018-02-01: qty 100

## 2018-02-01 MED ORDER — POTASSIUM CHLORIDE CRYS ER 20 MEQ PO TBCR
40.0000 meq | EXTENDED_RELEASE_TABLET | Freq: Once | ORAL | Status: DC
  Filled 2018-02-01: qty 2

## 2018-02-01 MED ORDER — ONDANSETRON HCL 4 MG/2ML IJ SOLN
4.0000 mg | Freq: Once | INTRAMUSCULAR | Status: AC
  Administered 2018-02-01: 4 mg via INTRAVENOUS
  Filled 2018-02-01: qty 2

## 2018-02-01 MED ORDER — SODIUM CHLORIDE 0.9 % IV SOLN: Freq: Once | INTRAVENOUS | Status: DC

## 2018-02-01 MED ORDER — MAGNESIUM SULFATE 2 GM/50ML IV SOLN
2.0000 g | Freq: Once | INTRAVENOUS | Status: AC
  Administered 2018-02-02: 2 g via INTRAVENOUS
  Filled 2018-02-01: qty 50

## 2018-02-01 MED ORDER — MORPHINE SULFATE (PF) 4 MG/ML IV SOLN
4.0000 mg | Freq: Once | INTRAVENOUS | Status: AC
  Administered 2018-02-01: 4 mg via INTRAVENOUS
  Filled 2018-02-01: qty 1

## 2018-02-01 MED ORDER — LACTATED RINGERS IV BOLUS
1000.0000 mL | Freq: Once | INTRAVENOUS | Status: AC
  Administered 2018-02-02: 1000 mL via INTRAVENOUS

## 2018-02-01 NOTE — ED Provider Notes (Signed)
[facility] Provider Note   CSN: [id number] Arrival date & time: 02/01/18  2211     History   Chief Complaint Chief Complaint  Patient presents with  . Fall    HPI [name] is a 72 y.o. female.  HPI   72 yo F with h/o deafness, blindness here with fall and left leg deformity. History limited 2/2 pt's deafness and blindness. However, per family report, pt lives alone and is usually able to get about without difficulty. She fell at some point today and dragged herself to the door, and was able to call for help. Family believes she was down for most of the day today. She has been c/o left leg pain since the fall. No other pain. Per EMS, pt had very obvious left leg deformity on their exam. No other signs of trauma. Pt is not on blood thinners.  Level 5 caveat invoked as remainder of history, ROS, and physical exam limited due to patient's deafness and blindness.   History reviewed. No pertinent past medical history.  Patient Active Problem List   Diagnosis Date Noted  . Anemia 02/02/2018  . Thrombocytopenia (HCC) 02/02/2018  . Hypokalemia 02/02/2018  . Hypomagnesemia 02/02/2018  . Femur fracture, left (HCC) 02/02/2018  . Adrenal insufficiency (HCC) 02/02/2018  . Hypothyroidism 02/02/2018  . Type II diabetes mellitus (HCC) 02/02/2018  . Elevated troponin 02/02/2018    History reviewed. No pertinent surgical history.   OB History   None      Home Medications    Prior to Admission medications   Medication Sig Start Date End Date Taking? Authorizing Provider  hydrocortisone (CORTEF) 20 MG tablet Take 10 mg by mouth daily.  01/11/18  Yes [provider]  levothyroxine (SYNTHROID, LEVOTHROID) 75 MCG tablet Take 75 mcg by mouth daily. 01/11/18  Yes [provider]  metFORMIN (GLUCOPHAGE) 500 MG tablet Take 500 mg by mouth 2 (two) times daily with a meal.   Yes [provider]  Multiple Vitamins-Minerals  (CENTRUM SILVER 50+WOMEN) TABS Take 1 tablet by mouth daily.   Yes [provider]  prednisoLONE acetate (PRED FORTE) 1 % ophthalmic suspension Place 1 drop into both eyes 2 (two) times daily.  01/04/18  Yes [provider]  sodium chloride (MURO 128) 2 % ophthalmic solution Place 1 drop into both eyes 2 (two) times daily.   Yes [provider]    Family History Family History  Problem Relation Age of Onset  . Vitiligo Son     Social History Social History   Tobacco Use  . Smoking status: Never Smoker  Substance Use Topics  . Alcohol use: Not on file  . Drug use: Not on file     Allergies   Patient has no known allergies.   Review of Systems Review of Systems  Unable to perform ROS: Other  Musculoskeletal: Positive for arthralgias and joint swelling.  All other systems reviewed and are negative.    Physical Exam Updated Vital Signs BP (!) 119/52 (BP Location: Left Arm)   Pulse 77   Temp 99 F (37.2 C) (Oral)   Resp 16   Ht 5\' 3"  (1.6 m)   Wt 54.4 kg (120 lb)   SpO2 100%   BMI 21.26 kg/m   Physical Exam  Constitutional: She appears well-developed and well-nourished. No distress.  HENT:  Head: Normocephalic and atraumatic.  No apparent head trauma. No palpable skull deformity.  Eyes: Conjunctivae are normal.  Bilateral cataracts, pupils non-reactive  Neck: Neck supple.  No midline TTP  Cardiovascular: Normal rate, regular rhythm and normal heart sounds. Exam reveals no friction rub.  No murmur heard. Pulmonary/Chest: Effort normal and breath sounds normal. No respiratory distress. She has no wheezes. She has no rales.  Abdominal: Soft. Bowel sounds are normal. She exhibits no distension. There is no tenderness. There is no guarding.  Neurological: She is alert. She exhibits normal muscle tone.  Skin: Skin is warm. Capillary refill takes less than 2 seconds.  No apparent wounds, bruising, or pressure sores to the back  Psychiatric:  She has a normal mood and affect.  Nursing note and vitals reviewed.   LOWER EXTREMITY EXAM: LEFT  INSPECTION & PALPATION: Obvious deformity to the left mid and proximal femur. Mild muscular spasm. No bruising or open wounds.  SENSORY: sensation is intact to light touch in:  Superficial peroneal nerve distribution (over dorsum of foot) Deep peroneal nerve distribution (over first dorsal web space) Sural nerve distribution (over lateral aspect 5th metatarsal) Saphenous nerve distribution (over medial instep)  MOTOR:  + Motor EHL (great toe dorsiflexion) + FHL (great toe plantar flexion)  + TA (ankle dorsiflexion)  + GSC (ankle plantar flexion)  VASCULAR: 2+ dorsalis pedis and posterior tibialis pulses Capillary refill < 2 sec, toes warm and well-perfused  COMPARTMENTS: Soft, warm, well-perfused No pain with passive extension No parethesias   ED Treatments / Results  Labs (all labs ordered are listed, but only abnormal results are displayed) Labs Reviewed  CBC - Abnormal; Notable for the following components:      Result Value   RBC 3.20 (*)    Hemoglobin 8.4 (*)    HCT 25.1 (*)    Platelets 115 (*)    All other components within normal limits  BASIC METABOLIC PANEL - Abnormal; Notable for the following components:   Potassium 2.8 (*)    Chloride 118 (*)    CO2 16 (*)    Calcium 6.6 (*)    All other components within normal limits  HEPATIC FUNCTION PANEL - Abnormal; Notable for the following components:   Total Protein 4.5 (*)    Albumin 2.5 (*)    ALT 6 (*)    Alkaline Phosphatase 37 (*)    All other components within normal limits  MAGNESIUM - Abnormal; Notable for the following components:   Magnesium 1.5 (*)    All other components within normal limits  TROPONIN I - Abnormal; Notable for the following components:   Troponin I 0.29 (*)    All other components within normal limits  TROPONIN I - Abnormal; Notable for the following components:   Troponin I  0.92 (*)    All other components within normal limits  VITAMIN B12 - Abnormal; Notable for the following components:   Vitamin B-12 1,144 (*)    All other components within normal limits  IRON AND TIBC - Abnormal; Notable for the following components:   Iron 14 (*)    TIBC 176 (*)    Saturation Ratios 8 (*)    All other components within normal limits  RETICULOCYTES - Abnormal; Notable for the following components:   RBC. 3.83 (*)    All other components within normal limits  COMPREHENSIVE METABOLIC PANEL - Abnormal; Notable for the following components:   Glucose, Bld 113 (*)    Creatinine, Ser 1.12 (*)    Calcium 8.7 (*)    Total Protein 5.1 (*)    Albumin 3.0 (*)  ALT 10 (*)    GFR calc non Af Amer 48 (*)    GFR calc Af Amer 55 (*)    All other components within normal limits  CBC - Abnormal; Notable for the following components:   WBC 12.2 (*)    RBC 3.83 (*)    Hemoglobin 10.0 (*)    HCT 30.2 (*)    All other components within normal limits  MAGNESIUM - Abnormal; Notable for the following components:   Magnesium 2.6 (*)    All other components within normal limits  I-STAT TROPONIN, ED - Abnormal; Notable for the following components:   Troponin i, poc 0.55 (*)    All other components within normal limits  PROTIME-INR  CK  FOLATE  FERRITIN  GLUCOSE, CAPILLARY  URINALYSIS, ROUTINE W REFLEX MICROSCOPIC  TROPONIN I  TROPONIN I  CBG MONITORING, ED  TYPE AND SCREEN  ABO/RH  PREPARE RBC (CROSSMATCH)    EKG EKG Interpretation  Date/Time:  Wednesday February 01 2018 23:26:19 EDT Ventricular Rate:  69 PR Interval:    QRS Duration: 91 QT Interval:  418 QTC Calculation: 448 R Axis:   42 Text Interpretation:  Sinus rhythm Non-specific ST-t changes No apparent ST elevations or depressions Confirmed by Shaune PollackIsaacs, Yomara Toothman [phone]) on 02/01/2018 11:28:31 PM Also confirmed by Shaune PollackIsaacs, Ashlye Oviedo [phone]), editor Josephine IgoBelcher, Jessica ([phone])  on 02/02/2018 8:15:21 AM   Radiology Ct  Head Wo Contrast  Result Date: 02/02/2018 CLINICAL DATA:  Headache after fall.  History of diabetes. EXAM: CT HEAD WITHOUT CONTRAST CT CERVICAL SPINE WITHOUT CONTRAST TECHNIQUE: Multidetector CT imaging of the head and cervical spine was performed following the standard protocol without intravenous contrast. Multiplanar CT image reconstructions of the cervical spine were also generated. COMPARISON:  MRI of the head May 21, 2004 and CT HEAD May 17, 2004 FINDINGS: CT HEAD FINDINGS BRAIN: No intraparenchymal hemorrhage, mass effect nor midline shift. The ventricles and sulci are normal for age cavum septum pellucidum et vergae. Patchy supratentorial white matter hypodensities less than expected for patient's age, though non-specific are most compatible with chronic small vessel ischemic disease. No acute large vascular territory infarcts. No abnormal extra-axial fluid collections. Basal cisterns are patent. VASCULAR: Moderate calcific atherosclerosis of the carotid siphons. SKULL: No skull fracture. Moderate RIGHT temporomandibular osteoarthrosis. Stable RIGHT frontal calvarial probable hemangioma. No significant scalp soft tissue swelling. SINUSES/ORBITS: The mastoid air-cells and included paranasal sinuses are well-aerated.The included ocular globes and orbital contents are non-suspicious. Status post bilateral ocular lens implants. OTHER: None. CT CERVICAL SPINE FINDINGS ALIGNMENT: Straightened lordosis.  Vertebral bodies in alignment. SKULL BASE AND VERTEBRAE: Cervical vertebral bodies and posterior elements are intact. Mild C3-4, C5-6 and C6-7 disc height loss with endplate spurring consistent with degenerative discs. No destructive bony lesions. C1-2 articulation maintained, mild arthropathy. SOFT TISSUES AND SPINAL CANAL: Nonacute. Mild calcific atherosclerosis carotid bifurcations. DISC LEVELS: Mild canal stenosis C3-4, C4-5, C5-6. Moderate C3-4, moderate to severe C5-6 neural foraminal narrowing. UPPER  CHEST: Lung apices are clear. OTHER: None. IMPRESSION: CT HEAD: 1. Negative noncontrast CT HEAD for age. CT CERVICAL SPINE: 1. No fracture or malalignment. Electronically Signed   By: Awilda Metroourtnay  Bloomer M.D.   On: 02/02/2018 01:00   Ct Cervical Spine Wo Contrast  Result Date: 02/02/2018 CLINICAL DATA:  Headache after fall.  History of diabetes. EXAM: CT HEAD WITHOUT CONTRAST CT CERVICAL SPINE WITHOUT CONTRAST TECHNIQUE: Multidetector CT imaging of the head and cervical spine was performed following the standard protocol without intravenous contrast. Multiplanar CT image  reconstructions of the cervical spine were also generated. COMPARISON:  MRI of the head May 21, 2004 and CT HEAD May 17, 2004 FINDINGS: CT HEAD FINDINGS BRAIN: No intraparenchymal hemorrhage, mass effect nor midline shift. The ventricles and sulci are normal for age cavum septum pellucidum et vergae. Patchy supratentorial white matter hypodensities less than expected for patient's age, though non-specific are most compatible with chronic small vessel ischemic disease. No acute large vascular territory infarcts. No abnormal extra-axial fluid collections. Basal cisterns are patent. VASCULAR: Moderate calcific atherosclerosis of the carotid siphons. SKULL: No skull fracture. Moderate RIGHT temporomandibular osteoarthrosis. Stable RIGHT frontal calvarial probable hemangioma. No significant scalp soft tissue swelling. SINUSES/ORBITS: The mastoid air-cells and included paranasal sinuses are well-aerated.The included ocular globes and orbital contents are non-suspicious. Status post bilateral ocular lens implants. OTHER: None. CT CERVICAL SPINE FINDINGS ALIGNMENT: Straightened lordosis.  Vertebral bodies in alignment. SKULL BASE AND VERTEBRAE: Cervical vertebral bodies and posterior elements are intact. Mild C3-4, C5-6 and C6-7 disc height loss with endplate spurring consistent with degenerative discs. No destructive bony lesions. C1-2 articulation  maintained, mild arthropathy. SOFT TISSUES AND SPINAL CANAL: Nonacute. Mild calcific atherosclerosis carotid bifurcations. DISC LEVELS: Mild canal stenosis C3-4, C4-5, C5-6. Moderate C3-4, moderate to severe C5-6 neural foraminal narrowing. UPPER CHEST: Lung apices are clear. OTHER: None. IMPRESSION: CT HEAD: 1. Negative noncontrast CT HEAD for age. CT CERVICAL SPINE: 1. No fracture or malalignment. Electronically Signed   By: Awilda Metro M.D.   On: 02/02/2018 01:00   Dg Chest Portable 1 View  Result Date: 02/01/2018 CLINICAL DATA:  Fall, deformity. EXAM: PORTABLE CHEST 1 VIEW COMPARISON:  Chest radiograph June 25, 2005 FINDINGS: Cardiomediastinal silhouette is normal. Coronary artery calcifications versus stents. Calcified aortic arch. No pleural effusions or focal consolidations. Trachea projects midline and there is no pneumothorax. Soft tissue planes and included osseous structures are non-suspicious. Osteopenia. IMPRESSION: No acute cardiopulmonary process. Aortic Atherosclerosis (ICD10-I70.0). Electronically Signed   By: Awilda Metro M.D.   On: 02/01/2018 22:48   Dg Femur Portable Min 2 Views Left  Result Date: 02/01/2018 CLINICAL DATA:  72 y/o  F; fall. EXAM: LEFT FEMUR PORTABLE 2 VIEWS COMPARISON:  None. FINDINGS: Three part comminuted acute fracture of the proximal left femur diaphysis with 1 shaft's with anterior and lateral displacement of the major proximal shaft component from distal. Mild osteoarthrosis of the left hip joint with periarticular osteophytosis. Left knee joint is well maintained. Bones are demineralized. Vascular calcifications. IMPRESSION: Comminuted displaced acute fracture of proximal left femur. Electronically Signed   By: Mitzi Hansen M.D.   On: 02/01/2018 22:49    Procedures Procedures (including critical care time)  Medications Ordered in ED Medications  potassium chloride SA (K-DUR,KLOR-CON) CR tablet 40 mEq (40 mEq Oral Not Given  02/02/18 0057)  hydrocortisone (CORTEF) tablet 30 mg (has no administration in time range)  levothyroxine (SYNTHROID, LEVOTHROID) tablet 75 mcg (75 mcg Oral Given 02/02/18 0942)  multivitamin with minerals tablet 1 tablet (1 tablet Oral Given 02/02/18 0942)  prednisoLONE acetate (PRED FORTE) 1 % ophthalmic suspension 1 drop (has no administration in time range)  sodium chloride (MURO 128) 2 % ophthalmic solution 1 drop (has no administration in time range)  HYDROcodone-acetaminophen (NORCO/VICODIN) 5-325 MG per tablet 1-2 tablet (has no administration in time range)  morphine 4 MG/ML injection 0.52-1 mg (1 mg Intravenous Given 02/02/18 0943)  insulin aspart (novoLOG) injection 0-9 Units (0 Units Subcutaneous Not Given 02/02/18 0943)  senna-docusate (Senokot-S) tablet 1 tablet (has no  administration in time range)  bisacodyl (DULCOLAX) EC tablet 5 mg (has no administration in time range)  0.9 %  sodium chloride infusion ( Intravenous New Bag/Given 02/02/18 0942)  labetalol (NORMODYNE,TRANDATE) injection 5-10 mg (has no administration in time range)  morphine 4 MG/ML injection 4 mg (4 mg Intravenous Given 02/01/18 2257)  ondansetron (ZOFRAN) injection 4 mg (4 mg Intravenous Given 02/01/18 2257)  lactated ringers bolus 1,000 mL (0 mLs Intravenous Stopped 02/02/18 0203)  potassium chloride 10 mEq in 100 mL IVPB (0 mEq Intravenous Stopped 02/02/18 0310)  magnesium sulfate IVPB 2 g 50 mL (0 g Intravenous Stopped 02/02/18 0156)  calcium gluconate 1 g in sodium chloride 0.9 % 100 mL IVPB (0 g Intravenous Stopped 02/02/18 0156)     Initial Impression / Assessment and Plan / ED Course  I have reviewed the triage vital signs and the nursing notes.  Pertinent labs & imaging results that were available during my care of the patient were reviewed by me and considered in my medical decision making (see chart for details).     72 yo F here with L femur deformity s/p mechanical fall at home. Pt with obvious  deformity to left leg. Plain films show displaced femur fx. Distal NV appears intact. Discussed with Dr. Aundria Rud of Ortho - will see pt in AM. Buck's traction ordered. Otherwise, pt has CO2 16 - suspect this is 2/2 dehydration from being down all day. CK normal. Pt also with Hgb 8.4 - no signs of active bleeding, likely chronic but Type and Screen sent. Trop elevated at 0.55. Suspect this is dehydration and demand ischemia 2/2 being down all day, but will need to be trended before OR. Pt given fluids, electrolyte replacement, and will admit to medicine.  Final Clinical Impressions(s) / ED Diagnoses   Final diagnoses:  Closed displaced comminuted fracture of shaft of left femur, initial encounter (HCC)  Elevated troponin  Dehydration    ED Discharge Orders    None       Shaune Pollack, MD 02/02/18 1002

## 2018-02-01 NOTE — ED Notes (Signed)
Nurse collected labs. 

## 2018-02-01 NOTE — ED Triage Notes (Signed)
Patient from home after a fall.  Left femur deformity with traction applied by EMS.  Patient Hard of hearing, history hard to obtain.

## 2018-02-01 NOTE — Progress Notes (Signed)
Orthopedic note:  I have discussed this case with Dr. Erma HeritageIsaacs, the emergency department physician.  I have also reviewed images.  Our orthopedic standpoint before operative fixation of this fracture tomorrow morning.  She may eat tonight and will need to be nothing by mouth at midnight please.  We appreciate the hospitalist admission.  Full orthopedic consult note to follow tomorrow morning.

## 2018-02-02 ENCOUNTER — Inpatient Hospital Stay (HOSPITAL_COMMUNITY): Payer: Medicare Other

## 2018-02-02 ENCOUNTER — Encounter (HOSPITAL_COMMUNITY): Payer: Self-pay | Admitting: Family Medicine

## 2018-02-02 ENCOUNTER — Emergency Department (HOSPITAL_COMMUNITY): Payer: Medicare Other

## 2018-02-02 ENCOUNTER — Inpatient Hospital Stay (HOSPITAL_COMMUNITY): Payer: Medicare Other | Admitting: Anesthesiology

## 2018-02-02 ENCOUNTER — Encounter (HOSPITAL_COMMUNITY)
Admission: EM | Disposition: A | Discharge status: Skilled Nursing Facility | Payer: Self-pay | Source: Home / Self Care | Attending: Family Medicine

## 2018-02-02 DIAGNOSIS — D62 Acute posthemorrhagic anemia: Secondary | ICD-10-CM | POA: Diagnosis not present

## 2018-02-02 DIAGNOSIS — E038 Other specified hypothyroidism: Secondary | ICD-10-CM | POA: Diagnosis not present

## 2018-02-02 DIAGNOSIS — N179 Acute kidney failure, unspecified: Secondary | ICD-10-CM | POA: Diagnosis not present

## 2018-02-02 DIAGNOSIS — E872 Acidosis: Secondary | ICD-10-CM | POA: Diagnosis not present

## 2018-02-02 DIAGNOSIS — I21A1 Myocardial infarction type 2: Secondary | ICD-10-CM | POA: Diagnosis present

## 2018-02-02 DIAGNOSIS — E039 Hypothyroidism, unspecified: Secondary | ICD-10-CM | POA: Diagnosis present

## 2018-02-02 DIAGNOSIS — R51 Headache: Secondary | ICD-10-CM | POA: Diagnosis not present

## 2018-02-02 DIAGNOSIS — S0990XA Unspecified injury of head, initial encounter: Secondary | ICD-10-CM | POA: Diagnosis not present

## 2018-02-02 DIAGNOSIS — D696 Thrombocytopenia, unspecified: Secondary | ICD-10-CM

## 2018-02-02 DIAGNOSIS — Z79899 Other long term (current) drug therapy: Secondary | ICD-10-CM | POA: Diagnosis not present

## 2018-02-02 DIAGNOSIS — E119 Type 2 diabetes mellitus without complications: Secondary | ICD-10-CM

## 2018-02-02 DIAGNOSIS — E2740 Unspecified adrenocortical insufficiency: Secondary | ICD-10-CM | POA: Diagnosis not present

## 2018-02-02 DIAGNOSIS — E876 Hypokalemia: Secondary | ICD-10-CM | POA: Diagnosis present

## 2018-02-02 DIAGNOSIS — D649 Anemia, unspecified: Secondary | ICD-10-CM

## 2018-02-02 DIAGNOSIS — G8911 Acute pain due to trauma: Secondary | ICD-10-CM | POA: Diagnosis not present

## 2018-02-02 DIAGNOSIS — E7849 Other hyperlipidemia: Secondary | ICD-10-CM | POA: Diagnosis not present

## 2018-02-02 DIAGNOSIS — R748 Abnormal levels of other serum enzymes: Secondary | ICD-10-CM

## 2018-02-02 DIAGNOSIS — Z7984 Long term (current) use of oral hypoglycemic drugs: Secondary | ICD-10-CM | POA: Diagnosis not present

## 2018-02-02 DIAGNOSIS — S7292XA Unspecified fracture of left femur, initial encounter for closed fracture: Secondary | ICD-10-CM

## 2018-02-02 DIAGNOSIS — W1830XA Fall on same level, unspecified, initial encounter: Secondary | ICD-10-CM | POA: Diagnosis present

## 2018-02-02 DIAGNOSIS — M542 Cervicalgia: Secondary | ICD-10-CM | POA: Diagnosis not present

## 2018-02-02 DIAGNOSIS — R7989 Other specified abnormal findings of blood chemistry: Secondary | ICD-10-CM | POA: Diagnosis not present

## 2018-02-02 DIAGNOSIS — R778 Other specified abnormalities of plasma proteins: Secondary | ICD-10-CM | POA: Diagnosis present

## 2018-02-02 DIAGNOSIS — S199XXA Unspecified injury of neck, initial encounter: Secondary | ICD-10-CM | POA: Diagnosis not present

## 2018-02-02 DIAGNOSIS — H93013 Transient ischemic deafness, bilateral: Secondary | ICD-10-CM | POA: Diagnosis not present

## 2018-02-02 DIAGNOSIS — I10 Essential (primary) hypertension: Secondary | ICD-10-CM | POA: Diagnosis present

## 2018-02-02 DIAGNOSIS — E118 Type 2 diabetes mellitus with unspecified complications: Secondary | ICD-10-CM

## 2018-02-02 DIAGNOSIS — S72352A Displaced comminuted fracture of shaft of left femur, initial encounter for closed fracture: Secondary | ICD-10-CM | POA: Diagnosis not present

## 2018-02-02 DIAGNOSIS — D508 Other iron deficiency anemias: Secondary | ICD-10-CM | POA: Diagnosis not present

## 2018-02-02 DIAGNOSIS — Z01810 Encounter for preprocedural cardiovascular examination: Secondary | ICD-10-CM | POA: Diagnosis not present

## 2018-02-02 DIAGNOSIS — S8991XA Unspecified injury of right lower leg, initial encounter: Secondary | ICD-10-CM | POA: Diagnosis not present

## 2018-02-02 DIAGNOSIS — H548 Legal blindness, as defined in USA: Secondary | ICD-10-CM | POA: Diagnosis not present

## 2018-02-02 DIAGNOSIS — H9190 Unspecified hearing loss, unspecified ear: Secondary | ICD-10-CM | POA: Diagnosis present

## 2018-02-02 DIAGNOSIS — S299XXA Unspecified injury of thorax, initial encounter: Secondary | ICD-10-CM | POA: Diagnosis not present

## 2018-02-02 DIAGNOSIS — Z4789 Encounter for other orthopedic aftercare: Secondary | ICD-10-CM | POA: Diagnosis not present

## 2018-02-02 DIAGNOSIS — M6281 Muscle weakness (generalized): Secondary | ICD-10-CM | POA: Diagnosis not present

## 2018-02-02 DIAGNOSIS — S7292XS Unspecified fracture of left femur, sequela: Secondary | ICD-10-CM | POA: Diagnosis not present

## 2018-02-02 DIAGNOSIS — Y92009 Unspecified place in unspecified non-institutional (private) residence as the place of occurrence of the external cause: Secondary | ICD-10-CM | POA: Diagnosis not present

## 2018-02-02 HISTORY — PX: FEMUR IM NAIL: SHX1597

## 2018-02-02 LAB — COMPREHENSIVE METABOLIC PANEL
ALT: 10 U/L — AB (ref 14–54)
AST: 24 U/L (ref 15–41)
Albumin: 3 g/dL — ABNORMAL LOW (ref 3.5–5.0)
Alkaline Phosphatase: 44 U/L (ref 38–126)
Anion gap: 9 (ref 5–15)
BILIRUBIN TOTAL: 0.7 mg/dL (ref 0.3–1.2)
BUN: 14 mg/dL (ref 6–20)
CHLORIDE: 107 mmol/L (ref 101–111)
CO2: 24 mmol/L (ref 22–32)
Calcium: 8.7 mg/dL — ABNORMAL LOW (ref 8.9–10.3)
Creatinine, Ser: 1.12 mg/dL — ABNORMAL HIGH (ref 0.44–1.00)
GFR calc Af Amer: 55 mL/min — ABNORMAL LOW (ref >60)
GFR calc non Af Amer: 48 mL/min — ABNORMAL LOW (ref >60)
GLUCOSE: 113 mg/dL — AB (ref 65–99)
POTASSIUM: 3.7 mmol/L (ref 3.5–5.1)
Sodium: 140 mmol/L (ref 135–145)
TOTAL PROTEIN: 5.1 g/dL — AB (ref 6.5–8.1)

## 2018-02-02 LAB — IRON AND TIBC
IRON: 14 ug/dL — AB (ref 28–170)
SATURATION RATIOS: 8 % — AB (ref 10.4–31.8)
TIBC: 176 ug/dL — ABNORMAL LOW (ref 250–450)
UIBC: 162 ug/dL

## 2018-02-02 LAB — CBC
HCT: 12.1 % — ABNORMAL LOW (ref 36.0–46.0)
HEMATOCRIT: 30.2 % — AB (ref 36.0–46.0)
HEMATOCRIT: 47.6 % — AB (ref 36.0–46.0)
HEMOGLOBIN: 16.3 g/dL — AB (ref 12.0–15.0)
Hemoglobin: 10 g/dL — ABNORMAL LOW (ref 12.0–15.0)
Hemoglobin: 4 g/dL — CL (ref 12.0–15.0)
MCH: 26.1 pg (ref 26.0–34.0)
MCH: 26.3 pg (ref 26.0–34.0)
MCH: 28.6 pg (ref 26.0–34.0)
MCHC: 33.1 g/dL (ref 30.0–36.0)
MCHC: 33.1 g/dL (ref 30.0–36.0)
MCHC: 34.2 g/dL (ref 30.0–36.0)
MCV: 78.9 fL (ref 78.0–100.0)
MCV: 79.6 fL (ref 78.0–100.0)
MCV: 83.7 fL (ref 78.0–100.0)
PLATELETS: 83 10*3/uL — AB (ref 150–400)
Platelets: 174 10*3/uL (ref 150–400)
Platelets: 85 10*3/uL — ABNORMAL LOW (ref 150–400)
RBC: 3.83 MIL/uL — ABNORMAL LOW (ref 3.87–5.11)
RBC: 1.52 MIL/uL — ABNORMAL LOW (ref 3.87–5.11)
RBC: 5.69 MIL/uL — ABNORMAL HIGH (ref 3.87–5.11)
RDW: 14.2 % (ref 11.5–15.5)
RDW: 14.6 % (ref 11.5–15.5)
RDW: 15.4 % (ref 11.5–15.5)
WBC: 12.2 10*3/uL — ABNORMAL HIGH (ref 4.0–10.5)
WBC: 7.5 10*3/uL (ref 4.0–10.5)
WBC: 11.8 10*3/uL — AB (ref 4.0–10.5)

## 2018-02-02 LAB — FERRITIN: Ferritin: 242 ng/mL (ref 11–307)

## 2018-02-02 LAB — PREPARE RBC (CROSSMATCH)

## 2018-02-02 LAB — MAGNESIUM: Magnesium: 2.6 mg/dL — ABNORMAL HIGH (ref 1.7–2.4)

## 2018-02-02 LAB — ECHOCARDIOGRAM COMPLETE
HEIGHTINCHES: 63 in
Weight: 1920 oz

## 2018-02-02 LAB — RETICULOCYTES
RBC.: 3.83 MIL/uL — AB (ref 3.87–5.11)
Retic Count, Absolute: 42.1 10*3/uL (ref 19.0–186.0)
Retic Ct Pct: 1.1 % (ref 0.4–3.1)

## 2018-02-02 LAB — GLUCOSE, CAPILLARY
GLUCOSE-CAPILLARY: 79 mg/dL (ref 65–99)
GLUCOSE-CAPILLARY: 72 mg/dL (ref 65–99)
GLUCOSE-CAPILLARY: 90 mg/dL (ref 65–99)
GLUCOSE-CAPILLARY: 190 mg/dL — AB (ref 65–99)
Glucose-Capillary: 199 mg/dL — ABNORMAL HIGH (ref 65–99)

## 2018-02-02 LAB — TROPONIN I
TROPONIN I: 0.92 ng/mL — AB (ref <0.03)
TROPONIN I: 0.24 ng/mL — AB (ref <0.03)
Troponin I: 0.29 ng/mL (ref <0.03)
Troponin I: 0.51 ng/mL (ref <0.03)

## 2018-02-02 LAB — CBG MONITORING, ED: GLUCOSE-CAPILLARY: 98 mg/dL (ref 65–99)

## 2018-02-02 LAB — FOLATE: FOLATE: 26.1 ng/mL (ref >5.9)

## 2018-02-02 LAB — VITAMIN B12: Vitamin B-12: 1144 pg/mL — ABNORMAL HIGH (ref 180–914)

## 2018-02-02 LAB — ABO/RH: ABO/RH(D): O POS

## 2018-02-02 SURGERY — INSERTION, INTRAMEDULLARY ROD, FEMUR
Anesthesia: General | Site: Hip | Laterality: Left

## 2018-02-02 MED ORDER — FENTANYL CITRATE (PF) 100 MCG/2ML IJ SOLN
25.0000 ug | INTRAMUSCULAR | Status: DC | PRN
  Administered 2018-02-02 (×2): 50 ug via INTRAVENOUS

## 2018-02-02 MED ORDER — VANCOMYCIN HCL 500 MG IV SOLR
INTRAVENOUS | Status: AC
  Filled 2018-02-02: qty 500

## 2018-02-02 MED ORDER — FENTANYL CITRATE (PF) 100 MCG/2ML IJ SOLN
INTRAMUSCULAR | Status: AC
  Administered 2018-02-02: 50 ug via INTRAVENOUS
  Filled 2018-02-02: qty 2

## 2018-02-02 MED ORDER — HYDROCODONE-ACETAMINOPHEN 5-325 MG PO TABS
1.0000 | ORAL_TABLET | Freq: Four times a day (QID) | ORAL | Status: DC | PRN
  Administered 2018-02-03 – 2018-02-06 (×5): 1 via ORAL
  Filled 2018-02-02: qty 1
  Filled 2018-02-02 (×2): qty 2
  Filled 2018-02-02 (×3): qty 1

## 2018-02-02 MED ORDER — VANCOMYCIN HCL 500 MG IV SOLR
INTRAVENOUS | Status: DC | PRN
  Administered 2018-02-02: 500 mg

## 2018-02-02 MED ORDER — DEXTROSE 50 % IV SOLN
INTRAVENOUS | Status: DC | PRN
  Administered 2018-02-02: 25 g via INTRAVENOUS

## 2018-02-02 MED ORDER — PHENYLEPHRINE HCL 10 MG/ML IJ SOLN
INTRAMUSCULAR | Status: DC | PRN
  Administered 2018-02-02: 30 ug/min via INTRAVENOUS

## 2018-02-02 MED ORDER — FENTANYL CITRATE (PF) 250 MCG/5ML IJ SOLN
INTRAMUSCULAR | Status: AC
  Filled 2018-02-02: qty 5

## 2018-02-02 MED ORDER — HEPARIN BOLUS VIA INFUSION
3200.0000 [IU] | Freq: Once | INTRAVENOUS | Status: DC
  Filled 2018-02-02: qty 3200

## 2018-02-02 MED ORDER — HYDROCORTISONE 20 MG PO TABS
30.0000 mg | ORAL_TABLET | Freq: Every day | ORAL | Status: DC
  Administered 2018-02-03 – 2018-02-06 (×4): 30 mg via ORAL
  Filled 2018-02-02 (×5): qty 1

## 2018-02-02 MED ORDER — EPHEDRINE 5 MG/ML INJ
INTRAVENOUS | Status: AC
  Filled 2018-02-02: qty 10

## 2018-02-02 MED ORDER — PREDNISOLONE ACETATE 1 % OP SUSP
1.0000 [drp] | Freq: Two times a day (BID) | OPHTHALMIC | Status: DC
  Administered 2018-02-02 – 2018-02-06 (×7): 1 [drp] via OPHTHALMIC
  Filled 2018-02-02 (×2): qty 1

## 2018-02-02 MED ORDER — ONDANSETRON HCL 4 MG/2ML IJ SOLN
INTRAMUSCULAR | Status: DC | PRN
  Administered 2018-02-02: 4 mg via INTRAVENOUS

## 2018-02-02 MED ORDER — LACTATED RINGERS IV SOLN
INTRAVENOUS | Status: AC
  Administered 2018-02-02: 15:00:00 via INTRAVENOUS

## 2018-02-02 MED ORDER — ROCURONIUM BROMIDE 10 MG/ML (PF) SYRINGE
PREFILLED_SYRINGE | INTRAVENOUS | Status: DC | PRN
  Administered 2018-02-02: 50 mg via INTRAVENOUS

## 2018-02-02 MED ORDER — FENTANYL CITRATE (PF) 250 MCG/5ML IJ SOLN
INTRAMUSCULAR | Status: DC | PRN
  Administered 2018-02-02 (×4): 50 ug via INTRAVENOUS

## 2018-02-02 MED ORDER — LABETALOL HCL 5 MG/ML IV SOLN
5.0000 mg | INTRAVENOUS | Status: DC | PRN
  Administered 2018-02-02 – 2018-02-04 (×4): 10 mg via INTRAVENOUS
  Administered 2018-02-04: 5 mg via INTRAVENOUS
  Filled 2018-02-02 (×7): qty 4

## 2018-02-02 MED ORDER — SUCCINYLCHOLINE CHLORIDE 200 MG/10ML IV SOSY
PREFILLED_SYRINGE | INTRAVENOUS | Status: AC
  Filled 2018-02-02: qty 10

## 2018-02-02 MED ORDER — POTASSIUM CHLORIDE 10 MEQ/100ML IV SOLN
10.0000 meq | INTRAVENOUS | Status: DC
  Administered 2018-02-02: 10 meq via INTRAVENOUS
  Filled 2018-02-02: qty 100

## 2018-02-02 MED ORDER — SUGAMMADEX SODIUM 200 MG/2ML IV SOLN
INTRAVENOUS | Status: DC | PRN
  Administered 2018-02-02: 150 mg via INTRAVENOUS

## 2018-02-02 MED ORDER — MORPHINE SULFATE (PF) 4 MG/ML IV SOLN
0.5000 mg | INTRAVENOUS | Status: DC | PRN
  Administered 2018-02-02 – 2018-02-04 (×4): 1 mg via INTRAVENOUS
  Filled 2018-02-02 (×4): qty 1

## 2018-02-02 MED ORDER — INSULIN ASPART 100 UNIT/ML ~~LOC~~ SOLN
0.0000 [IU] | SUBCUTANEOUS | Status: DC
  Administered 2018-02-02: 2 [IU] via SUBCUTANEOUS
  Administered 2018-02-03: 3 [IU] via SUBCUTANEOUS
  Administered 2018-02-03: 1 [IU] via SUBCUTANEOUS
  Administered 2018-02-03 – 2018-02-04 (×4): 2 [IU] via SUBCUTANEOUS

## 2018-02-02 MED ORDER — LABETALOL HCL 5 MG/ML IV SOLN
INTRAVENOUS | Status: AC
  Filled 2018-02-02: qty 4

## 2018-02-02 MED ORDER — LACTATED RINGERS IV SOLN
INTRAVENOUS | Status: DC | PRN
  Administered 2018-02-02 (×2): via INTRAVENOUS

## 2018-02-02 MED ORDER — DEXAMETHASONE SODIUM PHOSPHATE 10 MG/ML IJ SOLN
INTRAMUSCULAR | Status: DC | PRN
  Administered 2018-02-02: 5 mg via INTRAVENOUS

## 2018-02-02 MED ORDER — SODIUM CHLORIDE 0.9 % IV SOLN
1.0000 g | Freq: Once | INTRAVENOUS | Status: AC
  Administered 2018-02-02: 1 g via INTRAVENOUS
  Filled 2018-02-02: qty 10

## 2018-02-02 MED ORDER — LIDOCAINE 2% (20 MG/ML) 5 ML SYRINGE
INTRAMUSCULAR | Status: DC | PRN
  Administered 2018-02-02: 100 mg via INTRAVENOUS

## 2018-02-02 MED ORDER — BISACODYL 5 MG PO TBEC: 5.0000 mg | DELAYED_RELEASE_TABLET | Freq: Every day | ORAL | Status: DC | PRN

## 2018-02-02 MED ORDER — CEFAZOLIN SODIUM-DEXTROSE 2-4 GM/100ML-% IV SOLN
2.0000 g | Freq: Once | INTRAVENOUS | Status: AC
  Administered 2018-02-02: 2 g via INTRAVENOUS

## 2018-02-02 MED ORDER — ADULT MULTIVITAMIN W/MINERALS CH
1.0000 | ORAL_TABLET | Freq: Every day | ORAL | Status: DC
  Administered 2018-02-03 – 2018-02-06 (×4): 1 via ORAL
  Filled 2018-02-02 (×5): qty 1

## 2018-02-02 MED ORDER — EPHEDRINE SULFATE-NACL 50-0.9 MG/10ML-% IV SOSY
PREFILLED_SYRINGE | INTRAVENOUS | Status: DC | PRN
  Administered 2018-02-02: 10 mg via INTRAVENOUS

## 2018-02-02 MED ORDER — PROMETHAZINE HCL 25 MG/ML IJ SOLN: 6.2500 mg | INTRAMUSCULAR | Status: DC | PRN

## 2018-02-02 MED ORDER — CEFAZOLIN SODIUM-DEXTROSE 2-4 GM/100ML-% IV SOLN
INTRAVENOUS | Status: AC
  Filled 2018-02-02: qty 100

## 2018-02-02 MED ORDER — PROPOFOL 10 MG/ML IV BOLUS
INTRAVENOUS | Status: DC | PRN
  Administered 2018-02-02: 80 mg via INTRAVENOUS

## 2018-02-02 MED ORDER — LEVOTHYROXINE SODIUM 75 MCG PO TABS
75.0000 ug | ORAL_TABLET | Freq: Every day | ORAL | Status: DC
  Administered 2018-02-03 – 2018-02-06 (×4): 75 ug via ORAL
  Filled 2018-02-02 (×5): qty 1

## 2018-02-02 MED ORDER — SODIUM CHLORIDE (HYPERTONIC) 2 % OP SOLN
1.0000 [drp] | Freq: Two times a day (BID) | OPHTHALMIC | Status: DC
  Administered 2018-02-03 – 2018-02-06 (×6): 1 [drp] via OPHTHALMIC
  Filled 2018-02-02 (×2): qty 15

## 2018-02-02 MED ORDER — LIDOCAINE 2% (20 MG/ML) 5 ML SYRINGE
INTRAMUSCULAR | Status: AC
  Filled 2018-02-02: qty 5

## 2018-02-02 MED ORDER — PHENYLEPHRINE 40 MCG/ML (10ML) SYRINGE FOR IV PUSH (FOR BLOOD PRESSURE SUPPORT)
PREFILLED_SYRINGE | INTRAVENOUS | Status: AC
  Filled 2018-02-02: qty 20

## 2018-02-02 MED ORDER — SENNOSIDES-DOCUSATE SODIUM 8.6-50 MG PO TABS: 1.0000 | ORAL_TABLET | Freq: Every evening | ORAL | Status: DC | PRN

## 2018-02-02 MED ORDER — ALBUMIN HUMAN 5 % IV SOLN
INTRAVENOUS | Status: DC | PRN
  Administered 2018-02-02: 17:00:00 via INTRAVENOUS

## 2018-02-02 MED ORDER — LABETALOL HCL 5 MG/ML IV SOLN
5.0000 mg | INTRAVENOUS | Status: DC | PRN
  Administered 2018-02-02: 5 mg via INTRAVENOUS

## 2018-02-02 MED ORDER — HEPARIN (PORCINE) IN NACL 100-0.45 UNIT/ML-% IJ SOLN
700.0000 [IU]/h | INTRAMUSCULAR | Status: DC
  Filled 2018-02-02: qty 250

## 2018-02-02 MED ORDER — TRAMADOL HCL 50 MG PO TABS: 50.0000 mg | ORAL_TABLET | Freq: Four times a day (QID) | ORAL | Status: DC | PRN

## 2018-02-02 MED ORDER — PHENYLEPHRINE 40 MCG/ML (10ML) SYRINGE FOR IV PUSH (FOR BLOOD PRESSURE SUPPORT)
PREFILLED_SYRINGE | INTRAVENOUS | Status: DC | PRN
  Administered 2018-02-02: 120 ug via INTRAVENOUS
  Administered 2018-02-02: 200 ug via INTRAVENOUS
  Administered 2018-02-02: 80 ug via INTRAVENOUS

## 2018-02-02 MED ORDER — SODIUM CHLORIDE 0.9 % IV SOLN
INTRAVENOUS | Status: AC
  Administered 2018-02-02 (×2): via INTRAVENOUS

## 2018-02-02 MED ORDER — 0.9 % SODIUM CHLORIDE (POUR BTL) OPTIME
TOPICAL | Status: DC | PRN
  Administered 2018-02-02: 1000 mL

## 2018-02-02 SURGICAL SUPPLY — 62 items
BANDAGE ACE 4X5 VEL STRL LF (GAUZE/BANDAGES/DRESSINGS) ×1 IMPLANT
BANDAGE ACE 6X5 VEL STRL LF (GAUZE/BANDAGES/DRESSINGS) IMPLANT
BANDAGE ELASTIC 6 VELCRO ST LF (GAUZE/BANDAGES/DRESSINGS) ×1 IMPLANT
BIT DRILL FLUTED FEMUR 4.2/3 (BIT) ×1 IMPLANT
BIT DRILL SHORT 4.2 (BIT) IMPLANT
BLADE SURG 10 STRL SS (BLADE) ×4 IMPLANT
BNDG COHESIVE 4X5 TAN STRL (GAUZE/BANDAGES/DRESSINGS) ×1 IMPLANT
BNDG COHESIVE 6X5 TAN STRL LF (GAUZE/BANDAGES/DRESSINGS) ×2 IMPLANT
BRUSH SCRUB SURG 4.25 DISP (MISCELLANEOUS) ×4 IMPLANT
CHLORAPREP W/TINT 26ML (MISCELLANEOUS) ×3 IMPLANT
COVER SURGICAL LIGHT HANDLE (MISCELLANEOUS) ×2 IMPLANT
DRAPE C-ARM 35X43 STRL (DRAPES) ×2 IMPLANT
DRAPE C-ARMOR (DRAPES) ×2 IMPLANT
DRAPE HALF SHEET 40X57 (DRAPES) ×6 IMPLANT
DRAPE IMP U-DRAPE 54X76 (DRAPES) ×4 IMPLANT
DRAPE INCISE IOBAN 66X45 STRL (DRAPES) ×2 IMPLANT
DRAPE ORTHO SPLIT 77X108 STRL (DRAPES) ×4
DRAPE SURG 17X23 STRL (DRAPES) ×2 IMPLANT
DRAPE SURG ORHT 6 SPLT 77X108 (DRAPES) ×2 IMPLANT
DRAPE U-SHAPE 47X51 STRL (DRAPES) ×3 IMPLANT
DRILL BIT SHORT 4.2 (BIT) ×4
DRSG MEPILEX BORDER 4X4 (GAUZE/BANDAGES/DRESSINGS) ×4 IMPLANT
DRSG MEPILEX BORDER 4X8 (GAUZE/BANDAGES/DRESSINGS) ×1 IMPLANT
ELECT REM PT RETURN 9FT ADLT (ELECTROSURGICAL) ×2
ELECTRODE REM PT RTRN 9FT ADLT (ELECTROSURGICAL) ×1 IMPLANT
GLOVE BIO SURGEON STRL SZ7.5 (GLOVE) ×6 IMPLANT
GLOVE BIOGEL PI IND STRL 6.5 (GLOVE) IMPLANT
GLOVE BIOGEL PI IND STRL 7.0 (GLOVE) IMPLANT
GLOVE BIOGEL PI IND STRL 7.5 (GLOVE) ×1 IMPLANT
GLOVE BIOGEL PI INDICATOR 6.5 (GLOVE) ×5
GLOVE BIOGEL PI INDICATOR 7.0 (GLOVE) ×1
GLOVE BIOGEL PI INDICATOR 7.5 (GLOVE) ×1
GOWN STRL REUS W/ TWL LRG LVL3 (GOWN DISPOSABLE) ×3 IMPLANT
GOWN STRL REUS W/TWL LRG LVL3 (GOWN DISPOSABLE) ×6
GOWN STRL REUS W/TWL XL LVL3 (GOWN DISPOSABLE) ×2 IMPLANT
GUIDEWIRE 3.2X400 (WIRE) ×3 IMPLANT
KIT BASIN OR (CUSTOM PROCEDURE TRAY) ×2 IMPLANT
KIT TURNOVER KIT B (KITS) ×2 IMPLANT
MANIFOLD NEPTUNE II (INSTRUMENTS) ×1 IMPLANT
NAIL CANN FRN 10X360 LT (Nail) ×1 IMPLANT
NS IRRIG 1000ML POUR BTL (IV SOLUTION) ×2 IMPLANT
PACK GENERAL/GYN (CUSTOM PROCEDURE TRAY) ×2 IMPLANT
PAD ARMBOARD 7.5X6 YLW CONV (MISCELLANEOUS) ×4 IMPLANT
REAMER ROD DEEP FLUTE 2.5X950 (INSTRUMENTS) ×1 IMPLANT
SCREW 6.5MM W/T25 STARDRIVE (Screw) ×1 IMPLANT
SCREW CANN LOCK TI FT 5X42 (Screw) ×1 IMPLANT
SCREW LOCKING 5.0X46MM (Screw) ×1 IMPLANT
SCREW LOCKING 5.0X54MM (Screw) ×1 IMPLANT
SCREW RECON 6.5 X 85MM (Screw) ×1 IMPLANT
STAPLER VISISTAT 35W (STAPLE) ×2 IMPLANT
STOCKINETTE IMPERVIOUS LG (DRAPES) ×2 IMPLANT
SUT ETHILON 3 0 PS 1 (SUTURE) ×3 IMPLANT
SUT MNCRL AB 3-0 PS2 18 (SUTURE) ×2 IMPLANT
SUT VIC AB 0 CT1 27 (SUTURE)
SUT VIC AB 0 CT1 27XBRD ANBCTR (SUTURE) IMPLANT
SUT VIC AB 2-0 CT1 27 (SUTURE) ×2
SUT VIC AB 2-0 CT1 TAPERPNT 27 (SUTURE) ×2 IMPLANT
TOWEL OR 17X24 6PK STRL BLUE (TOWEL DISPOSABLE) ×4 IMPLANT
TOWEL OR 17X26 10 PK STRL BLUE (TOWEL DISPOSABLE) ×4 IMPLANT
TRACTION ROPE ×1 IMPLANT
UNDERPAD 30X30 (UNDERPADS AND DIAPERS) ×2 IMPLANT
WATER STERILE IRR 1000ML POUR (IV SOLUTION) ×2 IMPLANT

## 2018-02-02 NOTE — Progress Notes (Signed)
  Pt admitted to the unit. Pt is stable, alert and oriented per baseline. Oriented to room, staff, and call bell. Educated to call for any assistance. Bed in lowest position, call bell within reach- will continue to monitor. 

## 2018-02-02 NOTE — Progress Notes (Signed)
PROGRESS NOTE    [name]  ZOX:[id number] DOB: 06/22/1946 DOA: 02/01/2018 PCP: Billee CashingMcKenzie, [place], MD   Brief Narrative:   [name] is a 72 y.o. female with medical history significant for deafness, hypothyroidism, adrenal insufficiency, and type 2 diabetes mellitus, now presenting to the emergency department for evaluation of the left thigh pain.  Patient fell at home on 02/01/2018, was found later in the day by family, she was unable to bear weight, and was sent to the ED for further evaluation.  She had reportedly been in her usual state of health prior to the fall, denies recent fevers or chills, denied chest pain, and denies shortness of breath or cough.  Per the report of her son at the bedside, she is typically able to ascend a flight of stairs without shortness of breath and does not have any known history of heart disease.  Admitted for further management of L femur fracture.  Assessment & Plan:   Principal Problem:   Femur fracture, left (HCC) Active Problems:   Anemia   Thrombocytopenia (HCC)   Hypokalemia   Hypomagnesemia   Adrenal insufficiency (HCC)   Hypothyroidism   Type II diabetes mellitus (HCC)   Elevated troponin    1. Left femur fracture  - Presents with left thigh pain after a fall at home and radiographs reveal acute comminuted displaced proximal femur fracture  - Orthopedic surgery is consulting and much appreciated  - Cardiology evaluation has been requested in light of elevated troponin.  Appreciate Dr. Damian LeavellPatwardhan's assistance.  - Correct electrolytes, continue gentle IVF hydration, continue analgesia and supportive care    2. Elevated troponin  - Troponin elevated to 0.55 in ED.  Peaked to 0.92, now downtrending. - She denies chest pain  - Most likely reflects a demand ischemia in setting of acute femur fracture  - Continue cardiac monitoring, trend troponin, request cardiology to evaluate prior to surgery  - appreciate Dr. Rosemary HolmsPatwardhan.   Recommending ASA, heparin gtt, lipitor.  Follow echo.  Recommending medical management.  3. Hypokalemia; hypomagnesemia  - improved. - Follow  4. Anemia; thrombocytopenia  - continue to monitor.   - Hgb is 8.4 and platelets 115k on admission.  Improved this morning. - Looks like significant drop around the time of surgery, likely related to heparin gtt.  Has received blood transfusion.  Vitals appear stable.  Will f/u with nursing caring for patient.  Follow up post transfusion H/H. - iron panel notable for low iron.  Normal folate and b12.  5. Hypothyroidism  - Continue Synthroid    6. Type II DM  - No A1c on file  - Managed with metformin at home, held on admission  - Check CBG's and use a SSI with Novolog while in hospital   7. Adrenal insufficiency  - Increase Cortef in setting of acute illness   # Patient is deaf and has difficulty seeing:  Typically communicates with white board or pen and paper.  Her son is very helpful in this aspect, but notes he's having trouble communicating with her here because she doesn't have her glasses.    # Delirium: moaning and seemed confused when I saw her this morning.  Said she was in pain.  I think delirium is due to pain and limited ability to communicate in this setting. Delirium precautions.   Continue to monitor.  DVT prophylaxis: SCD's (was on heparin this will be held now in setting of abla) Code Status: full  Family Communication: discussed with son  at bedside Disposition Plan: pending improvement   Consultants:   Ortho  cardiology  Procedures:   IM nail of L femur.  Placement and removal of distal femoral traction pin. 4/25. Echo 4/25 Study Conclusions  - Left ventricle: The cavity size was normal. There was focal basal   hypertrophy. No SAM. Increased LVOT gradients likely due to   hyperdynamic LV. Systolic function was hyperdynamic. The   estimated ejection fraction was in the range of 70% to 75%. Wall    motion was normal; there were no regional wall motion   abnormalities. Doppler parameters are consistent with abnormal   left ventricular relaxation (grade 1 diastolic dysfunction). - No significant valvular abnormality.  Recommendations:  Avoid perioperative hypotension. Recommend maintaining adequate volume status with fluids +/- blood transfusion. In case of hypotension, consider IV phenylephrine to avoid further LVOT obstruction.  Antimicrobials:  Anti-infectives (From admission, onward)   Start     Dose/Rate Route Frequency Ordered Stop   02/02/18 1727  vancomycin (VANCOCIN) powder  Status:  Discontinued       As needed 02/02/18 1727 02/02/18 1758   02/02/18 1515  ceFAZolin (ANCEF) IVPB 2g/100 mL premix     2 g 200 mL/hr over 30 Minutes Intravenous  Once 02/02/18 1511 02/02/18 1600   02/02/18 1507  ceFAZolin (ANCEF) 2-4 GM/100ML-% IVPB    Note to Pharmacy:  Ray Church   : cabinet override      02/02/18 1507 02/02/18 1550    \     Subjective: Moaning.  C/o pain. Patient son notes she stood up, stepped on something then fell. Having difficulty communicating because she doesn't have her glasses.  Objective: Vitals:   02/02/18 1939 02/02/18 1948 02/02/18 2003 02/02/18 2018  BP: 120/60 (!) 154/67 (!) 176/83 (!) 179/77  Pulse: 74 76 80 81  Resp: (!) 7 10 10 10   Temp:      TempSrc:      SpO2: 94% 96% 96% 96%  Weight:      Height:        Intake/Output Summary (Last 24 hours) at 02/02/2018 2034 Last data filed at 02/02/2018 1915 Gross per 24 hour  Intake 2710 ml  Output 850 ml  Net 1860 ml   Filed Weights   02/01/18 2215  Weight: 54.4 kg (120 lb)    Examination:  General exam: Uncomfotable in pain. Respiratory system: Clear to auscultation. Respiratory effort normal. Cardiovascular system: S1 & S2 heard, RRR. No JVD, murmurs, rubs, gallops or clicks. No pedal edema. Gastrointestinal system: Abdomen is nondistended, soft and nontender. No organomegaly or  masses felt. Normal bowel sounds heard. Central nervous system: Disoriented.  Moaning.  Deaf.  Moving all extremities. Extremities: LLE in traction. Skin: No rashes, lesions or ulcers Psychiatry: Judgement and insight appear normal. Mood & affect appropriate.     Data Reviewed: I have personally reviewed following labs and imaging studies  CBC: Recent Labs  Lab 02/01/18 2227 02/02/18 0328 02/02/18 1715  WBC 9.4 12.2* 7.5  HGB 8.4* 10.0* 4.0*  HCT 25.1* 30.2* 12.1*  MCV 78.4 78.9 79.6  PLT 115* 174 83*   Basic Metabolic Panel: Recent Labs  Lab 02/01/18 2227 02/01/18 2316 02/02/18 0328  NA 141  --  140  K 2.8*  --  3.7  CL 118*  --  107  CO2 16*  --  24  GLUCOSE 95  --  113*  BUN 13  --  14  CREATININE 0.79  --  1.12*  CALCIUM  6.6*  --  8.7*  MG  --  1.5* 2.6*   GFR: Estimated Creatinine Clearance: 37 mL/min (A) (by C-G formula based on SCr of 1.12 mg/dL (H)). Liver Function Tests: Recent Labs  Lab 02/01/18 2316 02/02/18 0328  AST 21 24  ALT 6* 10*  ALKPHOS 37* 44  BILITOT 0.7 0.7  PROT 4.5* 5.1*  ALBUMIN 2.5* 3.0*   No results for input(s): LIPASE, AMYLASE in the last 168 hours. No results for input(s): AMMONIA in the last 168 hours. Coagulation Profile: Recent Labs  Lab 02/01/18 2316  INR 0.95   Cardiac Enzymes: Recent Labs  Lab 02/01/18 2259 02/02/18 0015 02/02/18 0328 02/02/18 0956 02/02/18 1805  CKTOTAL 98  --   --   --   --   TROPONINI  --  0.29* 0.92* 0.51* 0.24*   BNP (last 3 results) No results for input(s): PROBNP in the last 8760 hours. HbA1C: No results for input(s): HGBA1C in the last 72 hours. CBG: Recent Labs  Lab 02/02/18 0843 02/02/18 1242 02/02/18 1446 02/02/18 1803 02/02/18 2030  GLUCAP 79 72 90 190* 199*   Lipid Profile: No results for input(s): CHOL, HDL, LDLCALC, TRIG, CHOLHDL, LDLDIRECT in the last 72 hours. Thyroid Function Tests: No results for input(s): TSH, T4TOTAL, FREET4, T3FREE, THYROIDAB in the last  72 hours. Anemia Panel: Recent Labs    02/02/18 0328  VITAMINB12 1,144*  FOLATE 26.1  FERRITIN 242  TIBC 176*  IRON 14*  RETICCTPCT 1.1   Sepsis Labs: No results for input(s): PROCALCITON, LATICACIDVEN in the last 168 hours.  No results found for this or any previous visit (from the past 240 hour(s)).       Radiology Studies: Ct Head Wo Contrast  Result Date: 02/02/2018 CLINICAL DATA:  Headache after fall.  History of diabetes. EXAM: CT HEAD WITHOUT CONTRAST CT CERVICAL SPINE WITHOUT CONTRAST TECHNIQUE: Multidetector CT imaging of the head and cervical spine was performed following the standard protocol without intravenous contrast. Multiplanar CT image reconstructions of the cervical spine were also generated. COMPARISON:  MRI of the head May 21, 2004 and CT HEAD May 17, 2004 FINDINGS: CT HEAD FINDINGS BRAIN: No intraparenchymal hemorrhage, mass effect nor midline shift. The ventricles and sulci are normal for age cavum septum pellucidum et vergae. Patchy supratentorial white matter hypodensities less than expected for patient's age, though non-specific are most compatible with chronic small vessel ischemic disease. No acute large vascular territory infarcts. No abnormal extra-axial fluid collections. Basal cisterns are patent. VASCULAR: Moderate calcific atherosclerosis of the carotid siphons. SKULL: No skull fracture. Moderate RIGHT temporomandibular osteoarthrosis. Stable RIGHT frontal calvarial probable hemangioma. No significant scalp soft tissue swelling. SINUSES/ORBITS: The mastoid air-cells and included paranasal sinuses are well-aerated.The included ocular globes and orbital contents are non-suspicious. Status post bilateral ocular lens implants. OTHER: None. CT CERVICAL SPINE FINDINGS ALIGNMENT: Straightened lordosis.  Vertebral bodies in alignment. SKULL BASE AND VERTEBRAE: Cervical vertebral bodies and posterior elements are intact. Mild C3-4, C5-6 and C6-7 disc height loss  with endplate spurring consistent with degenerative discs. No destructive bony lesions. C1-2 articulation maintained, mild arthropathy. SOFT TISSUES AND SPINAL CANAL: Nonacute. Mild calcific atherosclerosis carotid bifurcations. DISC LEVELS: Mild canal stenosis C3-4, C4-5, C5-6. Moderate C3-4, moderate to severe C5-6 neural foraminal narrowing. UPPER CHEST: Lung apices are clear. OTHER: None. IMPRESSION: CT HEAD: 1. Negative noncontrast CT HEAD for age. CT CERVICAL SPINE: 1. No fracture or malalignment. Electronically Signed   By: Awilda Metro M.D.   On: 02/02/2018 01:00  Ct Cervical Spine Wo Contrast  Result Date: 02/02/2018 CLINICAL DATA:  Headache after fall.  History of diabetes. EXAM: CT HEAD WITHOUT CONTRAST CT CERVICAL SPINE WITHOUT CONTRAST TECHNIQUE: Multidetector CT imaging of the head and cervical spine was performed following the standard protocol without intravenous contrast. Multiplanar CT image reconstructions of the cervical spine were also generated. COMPARISON:  MRI of the head May 21, 2004 and CT HEAD May 17, 2004 FINDINGS: CT HEAD FINDINGS BRAIN: No intraparenchymal hemorrhage, mass effect nor midline shift. The ventricles and sulci are normal for age cavum septum pellucidum et vergae. Patchy supratentorial white matter hypodensities less than expected for patient's age, though non-specific are most compatible with chronic small vessel ischemic disease. No acute large vascular territory infarcts. No abnormal extra-axial fluid collections. Basal cisterns are patent. VASCULAR: Moderate calcific atherosclerosis of the carotid siphons. SKULL: No skull fracture. Moderate RIGHT temporomandibular osteoarthrosis. Stable RIGHT frontal calvarial probable hemangioma. No significant scalp soft tissue swelling. SINUSES/ORBITS: The mastoid air-cells and included paranasal sinuses are well-aerated.The included ocular globes and orbital contents are non-suspicious. Status post bilateral ocular  lens implants. OTHER: None. CT CERVICAL SPINE FINDINGS ALIGNMENT: Straightened lordosis.  Vertebral bodies in alignment. SKULL BASE AND VERTEBRAE: Cervical vertebral bodies and posterior elements are intact. Mild C3-4, C5-6 and C6-7 disc height loss with endplate spurring consistent with degenerative discs. No destructive bony lesions. C1-2 articulation maintained, mild arthropathy. SOFT TISSUES AND SPINAL CANAL: Nonacute. Mild calcific atherosclerosis carotid bifurcations. DISC LEVELS: Mild canal stenosis C3-4, C4-5, C5-6. Moderate C3-4, moderate to severe C5-6 neural foraminal narrowing. UPPER CHEST: Lung apices are clear. OTHER: None. IMPRESSION: CT HEAD: 1. Negative noncontrast CT HEAD for age. CT CERVICAL SPINE: 1. No fracture or malalignment. Electronically Signed   By: Awilda Metro M.D.   On: 02/02/2018 01:00   Dg Chest Portable 1 View  Result Date: 02/01/2018 CLINICAL DATA:  Fall, deformity. EXAM: PORTABLE CHEST 1 VIEW COMPARISON:  Chest radiograph June 25, 2005 FINDINGS: Cardiomediastinal silhouette is normal. Coronary artery calcifications versus stents. Calcified aortic arch. No pleural effusions or focal consolidations. Trachea projects midline and there is no pneumothorax. Soft tissue planes and included osseous structures are non-suspicious. Osteopenia. IMPRESSION: No acute cardiopulmonary process. Aortic Atherosclerosis (ICD10-I70.0). Electronically Signed   By: Awilda Metro M.D.   On: 02/01/2018 22:48   Dg C-arm 1-60 Min  Result Date: 02/02/2018 CLINICAL DATA:  Intramedullary rod fixation of left femur. EXAM: DG C-ARM 61-120 MIN; LEFT FEMUR 2 VIEWS Radiation exposure index: 20.44 mGy. COMPARISON:  Radiographs of February 01, 2018. FINDINGS: Twelve intraoperative fluoroscopic images were obtained of the left femur. These demonstrate intramedullary rod fixation of comminuted proximal left femoral shaft fracture. Good alignment of fracture components is noted. IMPRESSION: Status post  intramedullary rod fixation of comminuted proximal left femoral shaft fracture. Electronically Signed   By: Lupita Raider, M.D.   On: 02/02/2018 17:53   Dg C-arm 1-60 Min  Result Date: 02/02/2018 CLINICAL DATA:  Intramedullary rod fixation of left femur. EXAM: DG C-ARM 61-120 MIN; LEFT FEMUR 2 VIEWS Radiation exposure index: 20.44 mGy. COMPARISON:  Radiographs of February 01, 2018. FINDINGS: Twelve intraoperative fluoroscopic images were obtained of the left femur. These demonstrate intramedullary rod fixation of comminuted proximal left femoral shaft fracture. Good alignment of fracture components is noted. IMPRESSION: Status post intramedullary rod fixation of comminuted proximal left femoral shaft fracture. Electronically Signed   By: Lupita Raider, M.D.   On: 02/02/2018 17:53   Dg Femur Min 2 Views Left  Result Date: 02/02/2018 CLINICAL DATA:  Intramedullary rod fixation of left femur. EXAM: DG C-ARM 61-120 MIN; LEFT FEMUR 2 VIEWS Radiation exposure index: 20.44 mGy. COMPARISON:  Radiographs of February 01, 2018. FINDINGS: Twelve intraoperative fluoroscopic images were obtained of the left femur. These demonstrate intramedullary rod fixation of comminuted proximal left femoral shaft fracture. Good alignment of fracture components is noted. IMPRESSION: Status post intramedullary rod fixation of comminuted proximal left femoral shaft fracture. Electronically Signed   By: Lupita Raider, M.D.   On: 02/02/2018 17:53   Dg Femur Port Min 2 Views Left  Result Date: 02/02/2018 CLINICAL DATA:  Postop EXAM: LEFT FEMUR PORTABLE 2 VIEWS COMPARISON:  02/01/2018 FINDINGS: Interval intramedullary rod and screw fixation of the left femur for comminuted fracture involving the proximal shaft of the femur. Decreased angulation and displacement at the fracture site. Small soft tissue gas consistent with recent operative status. IMPRESSION: Interval intramedullary rod and screw fixation of left femur for comminuted  fracture of the proximal femoral shaft with decreased displacement and angulation compared to prior. Expected postsurgical changes. Electronically Signed   By: Jasmine Pang M.D.   On: 02/02/2018 19:11   Dg Femur Portable Min 2 Views Left  Result Date: 02/01/2018 CLINICAL DATA:  72 y/o  F; fall. EXAM: LEFT FEMUR PORTABLE 2 VIEWS COMPARISON:  None. FINDINGS: Three part comminuted acute fracture of the proximal left femur diaphysis with 1 shaft's with anterior and lateral displacement of the major proximal shaft component from distal. Mild osteoarthrosis of the left hip joint with periarticular osteophytosis. Left knee joint is well maintained. Bones are demineralized. Vascular calcifications. IMPRESSION: Comminuted displaced acute fracture of proximal left femur. Electronically Signed   By: Mitzi Hansen M.D.   On: 02/01/2018 22:49        Scheduled Meds: . [MAR Hold] heparin  3,200 Units Intravenous Once  . [MAR Hold] hydrocortisone  30 mg Oral Q breakfast  . [MAR Hold] insulin aspart  0-9 Units Subcutaneous Q4H  . labetalol      . [MAR Hold] levothyroxine  75 mcg Oral QAC breakfast  . [MAR Hold] multivitamin with minerals  1 tablet Oral Daily  . [MAR Hold] potassium chloride  40 mEq Oral Once  . [MAR Hold] prednisoLONE acetate  1 drop Both Eyes BID  . [MAR Hold] sodium chloride  1 drop Both Eyes BID   Continuous Infusions: . lactated ringers 10 mL/hr at 02/02/18 1512     LOS: 0 days    Time spent: over 30 min    Lacretia Nicks, MD [facility] Pager [phone]  If 7PM-7AM, please contact night-coverage [email or web address] Password [facility] 02/02/2018, 8:34 PM

## 2018-02-02 NOTE — Transfer of Care (Signed)
Immediate Anesthesia Transfer of Care Note  Patient: [name]  Procedure(s) Performed: INTRAMEDULLARY (IM) NAIL LEFT FEMUR (Left Hip)  Patient Location: PACU  Anesthesia Type:General  Level of Consciousness: lethargic and responds to stimulation  Airway & Oxygen Therapy: Patient Spontanous Breathing and Patient connected to face mask oxygen  Post-op Assessment: Report given to RN and Post -op Vital signs reviewed and stable  Post vital signs: Reviewed and stable  Last Vitals:  Vitals Value Taken Time  BP 145/57 02/02/2018  6:02 PM  Temp    Pulse 92 02/02/2018  6:06 PM  Resp 8 02/02/2018  6:06 PM  SpO2 100 % 02/02/2018  6:06 PM  Vitals shown include unvalidated device data.  Last Pain:  Vitals:   02/02/18 1300  TempSrc: Oral  PainSc:          Complications: No apparent anesthesia complications

## 2018-02-02 NOTE — Progress Notes (Signed)
ANTICOAGULATION CONSULT NOTE - Initial Consult  Pharmacy Consult for heparin Indication: chest pain/ACS  No Known Allergies  Patient Measurements: Height: 5\' 3"  (160 cm) Weight: 120 lb (54.4 kg) IBW/kg (Calculated) : 52.4 Heparin Dosing Weight: 54 kg  Vital Signs: Temp: 99 F (37.2 C) (04/25 0700) Temp Source: Oral (04/25 0700) BP: 119/52 (04/25 0700) Pulse Rate: 77 (04/25 0700)  Labs: Recent Labs    02/01/18 2227 02/01/18 2259 02/01/18 2316 02/02/18 0015 02/02/18 0328 02/02/18 0956  HGB 8.4*  --   --   --  10.0*  --   HCT 25.1*  --   --   --  30.2*  --   PLT 115*  --   --   --  174  --   LABPROT  --   --  12.5  --   --   --   INR  --   --  0.95  --   --   --   CREATININE 0.79  --   --   --  1.12*  --   CKTOTAL  --  98  --   --   --   --   TROPONINI  --   --   --  0.29* 0.92* 0.51*    Medical History: Past Medical History:  Diagnosis Date  . Type 2 diabetes mellitus [facility](HCC)     Assessment: 72 yo female s/p fall. Incidentally found + troponin. Planning to start heparin gtt while working up patient. SCr 1.1, eCrCl 35-40 ml/min, cbc ok.  Goal of Therapy:  Heparin level 0.3-0.7 units/ml Monitor platelets by anticoagulation protocol: Yes   Plan:  -Heparin bolus 3200 units x1 then 700 units/hr -Daily HL, CBC -Heparin level in 8 hours   Baldemar FridayMasters, Aneisa Karren M 02/02/2018,1:13 PM

## 2018-02-02 NOTE — Consult Note (Addendum)
Reason for Consult:Left femur fx Referring Physician: A [name] is an 72 y.o. female with hypothyroidism, adrenal insufficiency, and DM. HPI: [name] was at home and fell. She was not discovered for Joanne couple of hours. Unfortunately she is very HoH and doesn't have her glasses so cannot communicate. History gleaned through chart and son who is at the bedside. She c/o left thigh pain and x-rays showed Joanne proximal femur fx and orthopedic surgery was consulted.  History reviewed. No pertinent past medical history.  History reviewed. No pertinent surgical history.  Family History  Problem Relation Age of Onset  . Vitiligo Son     Social History:  reports that she has never smoked. She does not have any smokeless tobacco history on file. Her alcohol and drug histories are not on file.  Allergies: No Known Allergies  Medications: I have reviewed the patient's current medications.  Results for orders placed or performed during the hospital encounter of 02/01/18 (from the past 48 hour(s))  CBC     Status: Abnormal   Collection Time: 02/01/18 10:27 PM  Result Value Ref Range   WBC 9.4 4.0 - 10.5 K/uL   RBC 3.20 (L) 3.87 - 5.11 MIL/uL   Hemoglobin 8.4 (L) 12.0 - 15.0 g/dL   HCT 25.1 (L) 36.0 - 46.0 %   MCV 78.4 78.0 - 100.0 fL   MCH 26.3 26.0 - 34.0 pg   MCHC 33.5 30.0 - 36.0 g/dL   RDW 14.2 11.5 - 15.5 %   Platelets 115 (L) 150 - 400 K/uL    Comment: REPEATED TO VERIFY SPECIMEN CHECKED FOR CLOTS PLATELET COUNT CONFIRMED BY SMEAR Performed at [facility], [address], [place], [place] [address]   Basic metabolic panel     Status: Abnormal   Collection Time: 02/01/18 10:27 PM  Result Value Ref Range   Sodium 141 135 - 145 mmol/L   Potassium 2.8 (L) 3.5 - 5.1 mmol/L   Chloride 118 (H) 101 - 111 mmol/L   CO2 16 (L) 22 - 32 mmol/L   Glucose, Bld 95 65 - 99 mg/dL   BUN 13 6 - 20 mg/dL   Creatinine, Ser 0.79 0.44 - 1.00 mg/dL   Calcium 6.6 (L) 8.9 - 10.3 mg/dL    GFR calc non Af Amer >60 >60 mL/min   GFR calc Af Amer >60 >60 mL/min    Comment: (NOTE) The eGFR has been calculated using the CKD EPI equation. This calculation has not been validated in all clinical situations. eGFR's persistently <60 mL/min signify possible Chronic Kidney Disease.    Anion gap 7 5 - 15    Comment: Performed at [facility] [address] [place], [place] [address]  CK     Status: None   Collection Time: 02/01/18 10:59 PM  Result Value Ref Range   Total CK 98 38 - 234 U/L    Comment: Performed at [facility], [place] [address] [place], [place] [address]  Type and screen [place]     Status: None (Preliminary result)   Collection Time: 02/01/18 11:14 PM  Result Value Ref Range   ABO/RH(D) O POS    Antibody Screen NEG    Sample Expiration      02/04/2018 Performed at [facility], [place] [address], [place], [place] [address]    Unit Number [id number]    Blood Component Type RED CELLS,LR    Unit division 00  Status of Unit ALLOCATED    Transfusion Status OK TO TRANSFUSE    Crossmatch Result Compatible   ABO/Rh     Status: None   Collection Time: 02/01/18 11:14 PM  Result Value Ref Range   ABO/RH(D)      O POS Performed at [facility] [address], [place], [place] [address]   Protime-INR     Status: None   Collection Time: 02/01/18 11:16 PM  Result Value Ref Range   Prothrombin Time 12.5 11.4 - 15.2 seconds   INR 0.95     Comment: Performed at [facility] [address], [place], [place] [address]  Hepatic function panel     Status: Abnormal   Collection Time: 02/01/18 11:16 PM  Result Value Ref Range   Total Protein 4.5 (L) 6.5 - 8.1 g/dL   Albumin 2.5 (L) 3.5 - 5.0 g/dL   AST 21 15 - 41 U/L   ALT 6 (L) 14 - 54 U/L   Alkaline Phosphatase 37 (L) 38 - 126 U/L   Total Bilirubin 0.7 0.3 - 1.2 mg/dL   Bilirubin, Direct 0.2 0.1 - 0.5 mg/dL   Indirect Bilirubin 0.5 0.3 - 0.9 mg/dL     Comment: Performed at [facility] [address], [place], [place] [address]  Magnesium     Status: Abnormal   Collection Time: 02/01/18 11:16 PM  Result Value Ref Range   Magnesium 1.5 (L) 1.7 - 2.4 mg/dL    Comment: Performed at [facility] [address], [place], [place] [address]  I-Stat Troponin, ED (not at [facility])     Status: Abnormal   Collection Time: 02/01/18 11:17 PM  Result Value Ref Range   Troponin i, poc 0.55 (HH) 0.00 - 0.08 ng/mL   Comment NOTIFIED PHYSICIAN    Comment 3            Comment: Due to the release kinetics of cTnI, Joanne negative result within the first hours of the onset of symptoms does not rule out myocardial infarction with certainty. If myocardial infarction is still suspected, repeat the test at appropriate intervals.   Troponin I (q 6hr x 3)     Status: Abnormal   Collection Time: 02/02/18 12:15 AM  Result Value Ref Range   Troponin I 0.29 (HH) <0.03 ng/mL    Comment: CRITICAL RESULT CALLED TO, READ BACK BY AND VERIFIED WITH: MCCORD S,RN 02/02/18 0106 WAYK Performed at [facility], [address], [place], [place] [address]   Prepare RBC     Status: None   Collection Time: 02/02/18  1:30 AM  Result Value Ref Range   Order Confirmation      ORDER PROCESSED BY BLOOD BANK Performed at [facility], [place] [address], [place], [place] [address]   CBG monitoring, ED     Status: None   Collection Time: 02/02/18  2:07 AM  Result Value Ref Range   Glucose-Capillary 98 65 - 99 mg/dL   Comment 1 Notify RN    Comment 2 Document in Chart   Troponin I (q 6hr x 3)     Status: Abnormal   Collection Time: 02/02/18  3:28 AM  Result Value Ref Range   Troponin I 0.92 (HH) <0.03 ng/mL    Comment: CRITICAL VALUE NOTED.  VALUE IS CONSISTENT WITH PREVIOUSLY REPORTED AND CALLED VALUE. Performed at [facility], [place] [address] [place],  [address]   Vitamin  B12     Status: Abnormal   Collection Time: 02/02/18  3:28 AM  Result  Value Ref Range   Vitamin B-12 1,144 (H) 180 - 914 pg/mL    Comment: (NOTE) This assay is not validated for testing neonatal or myeloproliferative syndrome specimens for Vitamin B12 levels. Performed at [facility], [place] [address], [place], [place] [address]   Folate     Status: None   Collection Time: 02/02/18  3:28 AM  Result Value Ref Range   Folate 26.1 >5.9 ng/mL    Comment: Performed at [facility] [address] [place], [place] [address]  Iron and TIBC     Status: Abnormal   Collection Time: 02/02/18  3:28 AM  Result Value Ref Range   Iron 14 (L) 28 - 170 ug/dL   TIBC 176 (L) 250 - 450 ug/dL   Saturation Ratios 8 (L) 10.4 - 31.8 %   UIBC 162 ug/dL    Comment: Performed at [facility], [place] [address], [place], [place] [address]  Ferritin     Status: None   Collection Time: 02/02/18  3:28 AM  Result Value Ref Range   Ferritin 242 11 - 307 ng/mL    Comment: Performed at [facility], [place] [address] [place], [place] [address]  Reticulocytes     Status: Abnormal   Collection Time: 02/02/18  3:28 AM  Result Value Ref Range   Retic Ct Pct 1.1 0.4 - 3.1 %   RBC. 3.83 (L) 3.87 - 5.11 MIL/uL   Retic Count, Absolute 42.1 19.0 - 186.0 K/uL    Comment: Performed at [facility] [address], [place], [place] [address]  Comprehensive metabolic panel     Status: Abnormal   Collection Time: 02/02/18  3:28 AM  Result Value Ref Range   Sodium 140 135 - 145 mmol/L   Potassium 3.7 3.5 - 5.1 mmol/L   Chloride 107 101 - 111 mmol/L   CO2 24 22 - 32 mmol/L   Glucose, Bld 113 (H) 65 - 99 mg/dL   BUN 14 6 - 20 mg/dL   Creatinine, Ser 1.12 (H) 0.44 - 1.00 mg/dL   Calcium 8.7 (L) 8.9 - 10.3 mg/dL   Total Protein 5.1 (L) 6.5 - 8.1 g/dL   Albumin 3.0 (L) 3.5 - 5.0 g/dL   AST 24 15 - 41 U/L   ALT 10 (L) 14 - 54 U/L   Alkaline Phosphatase 44 38 - 126 U/L   Total Bilirubin 0.7 0.3 - 1.2 mg/dL   GFR calc non Af Amer 48 (L) >60 mL/min   GFR calc Af Amer  55 (L) >60 mL/min    Comment: (NOTE) The eGFR has been calculated using the CKD EPI equation. This calculation has not been validated in all clinical situations. eGFR's persistently <60 mL/min signify possible Chronic Kidney Disease.    Anion gap 9 5 - 15    Comment: Performed at [facility] [address] [place] [address]  CBC     Status: Abnormal   Collection Time: 02/02/18  3:28 AM  Result Value Ref Range   WBC 12.2 (H) 4.0 - 10.5 K/uL   RBC 3.83 (L) 3.87 - 5.11 MIL/uL   Hemoglobin 10.0 (L) 12.0 - 15.0 g/dL   HCT 30.2 (L) 36.0 - 46.0 %   MCV 78.9 78.0 - 100.0 fL   MCH 26.1 26.0 - 34.0 pg   MCHC  33.1 30.0 - 36.0 g/dL   RDW 14.2 11.5 - 15.5 %   Platelets 174 150 - 400 K/uL    Comment: Performed at [facility], [place] [address], [place], [place] [address]  Magnesium     Status: Abnormal   Collection Time: 02/02/18  3:28 AM  Result Value Ref Range   Magnesium 2.6 (H) 1.7 - 2.4 mg/dL    Comment: Performed at [facility] [address], [place], [place] [address]  Glucose, capillary     Status: None   Collection Time: 02/02/18  8:43 AM  Result Value Ref Range   Glucose-Capillary 79 65 - 99 mg/dL   Comment 1 Notify RN    Comment 2 Document in Chart     Ct Head Wo Contrast  Result Date: 02/02/2018 CLINICAL DATA:  Headache after fall.  History of diabetes. EXAM: CT HEAD WITHOUT CONTRAST CT CERVICAL SPINE WITHOUT CONTRAST TECHNIQUE: Multidetector CT imaging of the head and cervical spine was performed following the standard protocol without intravenous contrast. Multiplanar CT image reconstructions of the cervical spine were also generated. COMPARISON:  MRI of the head May 21, 2004 and CT HEAD May 17, 2004 FINDINGS: CT HEAD FINDINGS BRAIN: No intraparenchymal hemorrhage, mass effect nor midline shift. The ventricles and sulci are normal for age cavum septum pellucidum et vergae. Patchy supratentorial white matter hypodensities less than expected for  patient's age, though non-specific are most compatible with chronic small vessel ischemic disease. No acute large vascular territory infarcts. No abnormal extra-axial fluid collections. Basal cisterns are patent. VASCULAR: Moderate calcific atherosclerosis of the carotid siphons. SKULL: No skull fracture. Moderate RIGHT temporomandibular osteoarthrosis. Stable RIGHT frontal calvarial probable hemangioma. No significant scalp soft tissue swelling. SINUSES/ORBITS: The mastoid air-cells and included paranasal sinuses are well-aerated.The included ocular globes and orbital contents are non-suspicious. Status post bilateral ocular lens implants. OTHER: None. CT CERVICAL SPINE FINDINGS ALIGNMENT: Straightened lordosis.  Vertebral bodies in alignment. SKULL BASE AND VERTEBRAE: Cervical vertebral bodies and posterior elements are intact. Mild C3-4, C5-6 and C6-7 disc height loss with endplate spurring consistent with degenerative discs. No destructive bony lesions. C1-2 articulation maintained, mild arthropathy. SOFT TISSUES AND SPINAL CANAL: Nonacute. Mild calcific atherosclerosis carotid bifurcations. DISC LEVELS: Mild canal stenosis C3-4, C4-5, C5-6. Moderate C3-4, moderate to severe C5-6 neural foraminal narrowing. UPPER CHEST: Lung apices are clear. OTHER: None. IMPRESSION: CT HEAD: 1. Negative noncontrast CT HEAD for age. CT CERVICAL SPINE: 1. No fracture or malalignment. Electronically Signed   By: Elon Alas M.D.   On: 02/02/2018 01:00   Ct Cervical Spine Wo Contrast  Result Date: 02/02/2018 CLINICAL DATA:  Headache after fall.  History of diabetes. EXAM: CT HEAD WITHOUT CONTRAST CT CERVICAL SPINE WITHOUT CONTRAST TECHNIQUE: Multidetector CT imaging of the head and cervical spine was performed following the standard protocol without intravenous contrast. Multiplanar CT image reconstructions of the cervical spine were also generated. COMPARISON:  MRI of the head May 21, 2004 and CT HEAD May 17, 2004  FINDINGS: CT HEAD FINDINGS BRAIN: No intraparenchymal hemorrhage, mass effect nor midline shift. The ventricles and sulci are normal for age cavum septum pellucidum et vergae. Patchy supratentorial white matter hypodensities less than expected for patient's age, though non-specific are most compatible with chronic small vessel ischemic disease. No acute large vascular territory infarcts. No abnormal extra-axial fluid collections. Basal cisterns are patent. VASCULAR: Moderate calcific atherosclerosis of the carotid siphons. SKULL: No skull fracture. Moderate RIGHT temporomandibular osteoarthrosis. Stable RIGHT frontal calvarial  probable hemangioma. No significant scalp soft tissue swelling. SINUSES/ORBITS: The mastoid air-cells and included paranasal sinuses are well-aerated.The included ocular globes and orbital contents are non-suspicious. Status post bilateral ocular lens implants. OTHER: None. CT CERVICAL SPINE FINDINGS ALIGNMENT: Straightened lordosis.  Vertebral bodies in alignment. SKULL BASE AND VERTEBRAE: Cervical vertebral bodies and posterior elements are intact. Mild C3-4, C5-6 and C6-7 disc height loss with endplate spurring consistent with degenerative discs. No destructive bony lesions. C1-2 articulation maintained, mild arthropathy. SOFT TISSUES AND SPINAL CANAL: Nonacute. Mild calcific atherosclerosis carotid bifurcations. DISC LEVELS: Mild canal stenosis C3-4, C4-5, C5-6. Moderate C3-4, moderate to severe C5-6 neural foraminal narrowing. UPPER CHEST: Lung apices are clear. OTHER: None. IMPRESSION: CT HEAD: 1. Negative noncontrast CT HEAD for age. CT CERVICAL SPINE: 1. No fracture or malalignment. Electronically Signed   By: Elon Alas M.D.   On: 02/02/2018 01:00   Dg Chest Portable 1 View  Result Date: 02/01/2018 CLINICAL DATA:  Fall, deformity. EXAM: PORTABLE CHEST 1 VIEW COMPARISON:  Chest radiograph June 25, 2005 FINDINGS: Cardiomediastinal silhouette is normal. Coronary artery  calcifications versus stents. Calcified aortic arch. No pleural effusions or focal consolidations. Trachea projects midline and there is no pneumothorax. Soft tissue planes and included osseous structures are non-suspicious. Osteopenia. IMPRESSION: No acute cardiopulmonary process. Aortic Atherosclerosis (ICD10-I70.0). Electronically Signed   By: Elon Alas M.D.   On: 02/01/2018 22:48   Dg Femur Portable Min 2 Views Left  Result Date: 02/01/2018 CLINICAL DATA:  72 y/o  F; fall. EXAM: LEFT FEMUR PORTABLE 2 VIEWS COMPARISON:  None. FINDINGS: Three part comminuted acute fracture of the proximal left femur diaphysis with 1 shaft's with anterior and lateral displacement of the major proximal shaft component from distal. Mild osteoarthrosis of the left hip joint with periarticular osteophytosis. Left knee joint is well maintained. Bones are demineralized. Vascular calcifications. IMPRESSION: Comminuted displaced acute fracture of proximal left femur. Electronically Signed   By: Kristine Garbe M.D.   On: 02/01/2018 22:49    Review of Systems  Unable to perform ROS: Other   Blood pressure (!) 119/52, pulse 77, temperature 99 F (37.2 C), temperature source Oral, resp. rate 16, height 5' 3"  (1.6 m), weight 54.4 kg (120 lb), SpO2 100 %. Physical Exam  Constitutional: She appears well-developed and well-nourished. No distress.  HENT:  Head: Normocephalic and atraumatic.  Eyes: Conjunctivae are normal. Right eye exhibits no discharge. Left eye exhibits no discharge. No scleral icterus.  Neck: Normal range of motion.  Cardiovascular: Normal rate and regular rhythm.  Respiratory: [place] normal. No respiratory distress.  Musculoskeletal:  LLE No traumatic wounds, ecchymosis, or rash  Thigh TTP, in [place] traction  No knee or ankle effusion  Sens DPN, SPN, TN could not assess  Motor EHL, ext, flex, evers grossly intact  DP 1+, PT 0, No significant edema  Neurological: She is alert.  Skin:  Skin is warm and dry. She is not diaphoretic.  Psychiatric: She has Joanne normal mood and affect. Her behavior is normal.    Assessment/Plan: Left femur fx -- Will need IMN of femur. We hope to accomplish this afternoon but will depend on cardiology workup and clearance. Will need to start prophylactic Lovenox today if surgery is delayed. Elevated troponin -- 0.29 --> 0.92, awaiting 3rd draw any moment, per cardiology Multiple medical problems -- per IM    Lisette Abu, PA-C Orthopedic Surgery [phone] 02/02/2018, 9:10 AM

## 2018-02-02 NOTE — Progress Notes (Signed)
Initial Nutrition Assessment  DOCUMENTATION CODES:   Not applicable  INTERVENTION:    Advance diet as medically appropriate, RD to add interventions accordingly  NUTRITION DIAGNOSIS:   Increased nutrient needs related to post-op healing as evidenced by estimated needs  GOAL:   Patient will meet greater than or equal to 90% of their needs  MONITOR:   Diet advancement, PO intake, Labs, Skin, Weight trends, I & O's  REASON FOR ASSESSMENT:   Consult Hip fracture protocol  ASSESSMENT:   72 y.o. female with hypothyroidism, adrenal insufficiency, and DM. Pt fell at home and was not discovered for several hours. Xray showed proximal femur fx.  Pt sleeping. RD spoke with pt's son at bedside. Reports pt had a good appetite and was eating well PTA. He does not know if she's had any unintentional weight loss.  Pt is currently NPO for possible surgery later today. Medications include MVI, Zofran and levothyroxine. Labs reviewed. Mg 2.6 (H). CBG's [id number]. SLP evaluation pending.  NUTRITION - FOCUSED PHYSICAL EXAM:  Deferred at this time.  Diet Order:  Diet NPO time specified Except for: Ice Chips, Sips with Meds  EDUCATION NEEDS:   No education needs have been identified at this time  Skin:  Skin Assessment: Reviewed RN Assessment  Last BM:  N/A  Height:   Ht Readings from Last 1 Encounters:  02/01/18 5\' 3"  (1.6 m)   Weight:   Wt Readings from Last 1 Encounters:  02/01/18 120 lb (54.4 kg)   Ideal Body Weight:  52.2 kg  BMI:  Body mass index is 21.26 kg/m.  Estimated Nutritional Needs:   Kcal:  1500-1700  Protein:  70-85 gm  Fluid:  1.5-1.7 L  Maureen ChattersKatie Terron Merfeld, RD, LDN Pager #: [phone] After-Hours Pager #: [phone]

## 2018-02-02 NOTE — H&P (Signed)
History and Physical    [name]:[id number] DOB: September 25, 1946 DOA: 02/01/2018  PCP: Billee Cashing, MD   Patient coming from: Home  Chief Complaint: Left leg pain   HPI: [name] is a 72 y.o. female with medical history significant for deafness, hypothyroidism, adrenal insufficiency, and type 2 diabetes mellitus, now presenting to the emergency department for evaluation of the left thigh pain.  Patient fell at home on 02/01/2018, was found later in the day by family, she was unable to bear weight, and was sent to the ED for further evaluation.  She had reportedly been in her usual state of health prior to the fall, denies recent fevers or chills, denied chest pain, and denies shortness of breath or cough.  Per the report of her son at the bedside, she is typically able to ascend a flight of stairs without shortness of breath and does not have any known history of heart disease.  ED Course: Upon arrival to the ED, patient is found to be saturating well on room air, slightly hypertensive, and vitals otherwise stable.  EKG features a sinus rhythm with nonspecific ST abnormality.  Chest x-ray is negative for acute cardiopulmonary disease.  Noncontrast head CT is negative for acute intracranial abnormality.  Cervical spine CT is negative for acute pathology.  Radiographs of the left femur demonstrate comminuted displaced acute fracture of the left proximal femur.  Chemistry panel is notable for potassium 2.8, bicarbonate 16, magnesium 1.5, albumin 2.5, and total protein 4.5.  CBC features a hemoglobin of 8.4 and platelets of 115 with no prior labs available for comparison.  INR is normal and troponin is elevated to 0.55.  The patient was given a liter of lactated Ringer's, 2 g IV magnesium, oral and IV potassium, morphine, and Zofran in the ED.  Orthopedic surgery was consulted by the ED physician and recommended medical admission with the patient to be n.p.o. after midnight.  She remains  hemodynamically stable, in no apparent respiratory distress, and will be admitted to the telemetry unit for ongoing evaluation and management of acute left proximal femur fracture, complicated by marked electrolyte derangements, and elevated troponin.  Review of Systems:  All other systems reviewed and apart from HPI, are negative.  History reviewed. No pertinent past medical history.  History reviewed. No pertinent surgical history.   reports that she has never smoked. She does not have any smokeless tobacco history on file. Her alcohol and drug histories are not on file.  No Known Allergies  Family History  Problem Relation Age of Onset  . Vitiligo Son      Prior to Admission medications   Medication Sig Start Date End Date Taking? Authorizing Provider  hydrocortisone (CORTEF) 20 MG tablet Take 10 mg by mouth daily.  01/11/18  Yes [provider]  levothyroxine (SYNTHROID, LEVOTHROID) 75 MCG tablet Take 75 mcg by mouth daily. 01/11/18  Yes [provider]  metFORMIN (GLUCOPHAGE) 500 MG tablet Take 500 mg by mouth 2 (two) times daily with a meal.   Yes [provider]  Multiple Vitamins-Minerals (CENTRUM SILVER 50+WOMEN) TABS Take 1 tablet by mouth daily.   Yes [provider]  prednisoLONE acetate (PRED FORTE) 1 % ophthalmic suspension Place 1 drop into both eyes 2 (two) times daily.  01/04/18  Yes [provider]  sodium chloride (MURO 128) 2 % ophthalmic solution Place 1 drop into both eyes 2 (two) times daily.   Yes [provider]    Physical Exam: Vitals:  02/02/18 0015 02/02/18 0030 02/02/18 0045 02/02/18 0100  BP: (!) 141/58 (!) 156/63 (!) 161/63 (!) 187/73  Pulse: 74 78 77 71  Resp: 19 18 15 20   SpO2: 100% 100% 100% 100%  Weight:      Height:          Constitutional: NAD, calm  Eyes: PERTLA, lids and conjunctivae normal ENMT: Mucous membranes are moist. Posterior pharynx clear of any exudate or lesions.   Neck:  normal, supple, no masses, no thyromegaly Respiratory: clear to auscultation bilaterally, no wheezing, no crackles. Normal respiratory effort.   Cardiovascular: S1 & S2 heard, regular rate and rhythm. No extremity edema. No significant JVD. Abdomen: No distension, no tenderness, soft. Bowel sounds active.   Musculoskeletal: no clubbing / cyanosis. Left thigh exquisitely tender;.she is neurovascularly intact distally.  Skin: no significant rashes, lesions, ulcers. Poor turgor. Neurologic: Gross hearing and vision deficits. No facial asymmetry. Sensation to light touch intact. Strength 5/5 in all 4 limbs.  Psychiatric: Alert and oriented. Calm and cooperative.     Labs on Admission: I have personally reviewed following labs and imaging studies  CBC: Recent Labs  Lab 02/01/18 2227  WBC 9.4  HGB 8.4*  HCT 25.1*  MCV 78.4  PLT 115*   Basic Metabolic Panel: Recent Labs  Lab 02/01/18 2227 02/01/18 2316  NA 141  --   K 2.8*  --   CL 118*  --   CO2 16*  --   GLUCOSE 95  --   BUN 13  --   CREATININE 0.79  --   CALCIUM 6.6*  --   MG  --  1.5*   GFR: Estimated Creatinine Clearance: 51.8 mL/min (by C-G formula based on SCr of 0.79 mg/dL). Liver Function Tests: Recent Labs  Lab 02/01/18 2316  AST 21  ALT 6*  ALKPHOS 37*  BILITOT 0.7  PROT 4.5*  ALBUMIN 2.5*   No results for input(s): LIPASE, AMYLASE in the last 168 hours. No results for input(s): AMMONIA in the last 168 hours. Coagulation Profile: Recent Labs  Lab 02/01/18 2316  INR 0.95   Cardiac Enzymes: Recent Labs  Lab 02/01/18 2259 02/02/18 0015  CKTOTAL 98  --   TROPONINI  --  0.29*   BNP (last 3 results) No results for input(s): PROBNP in the last 8760 hours. HbA1C: No results for input(s): HGBA1C in the last 72 hours. CBG: No results for input(s): GLUCAP in the last 168 hours. Lipid Profile: No results for input(s): CHOL, HDL, LDLCALC, TRIG, CHOLHDL, LDLDIRECT in the last 72 hours. Thyroid Function  Tests: No results for input(s): TSH, T4TOTAL, FREET4, T3FREE, THYROIDAB in the last 72 hours. Anemia Panel: No results for input(s): VITAMINB12, FOLATE, FERRITIN, TIBC, IRON, RETICCTPCT in the last 72 hours. Urine analysis: No results found for: COLORURINE, APPEARANCEUR, LABSPEC, PHURINE, GLUCOSEU, HGBUR, BILIRUBINUR, KETONESUR, PROTEINUR, UROBILINOGEN, NITRITE, LEUKOCYTESUR Sepsis Labs: @LABRCNTIP (procalcitonin:4,lacticidven:4) )No results found for this or any previous visit (from the past 240 hour(s)).   Radiological Exams on Admission: Ct Head Wo Contrast  Result Date: 02/02/2018 CLINICAL DATA:  Headache after fall.  History of diabetes. EXAM: CT HEAD WITHOUT CONTRAST CT CERVICAL SPINE WITHOUT CONTRAST TECHNIQUE: Multidetector CT imaging of the head and cervical spine was performed following the standard protocol without intravenous contrast. Multiplanar CT image reconstructions of the cervical spine were also generated. COMPARISON:  MRI of the head May 21, 2004 and CT HEAD May 17, 2004 FINDINGS: CT HEAD FINDINGS BRAIN: No intraparenchymal hemorrhage, mass effect nor midline  shift. The ventricles and sulci are normal for age cavum septum pellucidum et vergae. Patchy supratentorial white matter hypodensities less than expected for patient's age, though non-specific are most compatible with chronic small vessel ischemic disease. No acute large vascular territory infarcts. No abnormal extra-axial fluid collections. Basal cisterns are patent. VASCULAR: Moderate calcific atherosclerosis of the carotid siphons. SKULL: No skull fracture. Moderate RIGHT temporomandibular osteoarthrosis. Stable RIGHT frontal calvarial probable hemangioma. No significant scalp soft tissue swelling. SINUSES/ORBITS: The mastoid air-cells and included paranasal sinuses are well-aerated.The included ocular globes and orbital contents are non-suspicious. Status post bilateral ocular lens implants. OTHER: None. CT CERVICAL  SPINE FINDINGS ALIGNMENT: Straightened lordosis.  Vertebral bodies in alignment. SKULL BASE AND VERTEBRAE: Cervical vertebral bodies and posterior elements are intact. Mild C3-4, C5-6 and C6-7 disc height loss with endplate spurring consistent with degenerative discs. No destructive bony lesions. C1-2 articulation maintained, mild arthropathy. SOFT TISSUES AND SPINAL CANAL: Nonacute. Mild calcific atherosclerosis carotid bifurcations. DISC LEVELS: Mild canal stenosis C3-4, C4-5, C5-6. Moderate C3-4, moderate to severe C5-6 neural foraminal narrowing. UPPER CHEST: Lung apices are clear. OTHER: None. IMPRESSION: CT HEAD: 1. Negative noncontrast CT HEAD for age. CT CERVICAL SPINE: 1. No fracture or malalignment. Electronically Signed   By: Awilda Metro M.D.   On: 02/02/2018 01:00   Ct Cervical Spine Wo Contrast  Result Date: 02/02/2018 CLINICAL DATA:  Headache after fall.  History of diabetes. EXAM: CT HEAD WITHOUT CONTRAST CT CERVICAL SPINE WITHOUT CONTRAST TECHNIQUE: Multidetector CT imaging of the head and cervical spine was performed following the standard protocol without intravenous contrast. Multiplanar CT image reconstructions of the cervical spine were also generated. COMPARISON:  MRI of the head May 21, 2004 and CT HEAD May 17, 2004 FINDINGS: CT HEAD FINDINGS BRAIN: No intraparenchymal hemorrhage, mass effect nor midline shift. The ventricles and sulci are normal for age cavum septum pellucidum et vergae. Patchy supratentorial white matter hypodensities less than expected for patient's age, though non-specific are most compatible with chronic small vessel ischemic disease. No acute large vascular territory infarcts. No abnormal extra-axial fluid collections. Basal cisterns are patent. VASCULAR: Moderate calcific atherosclerosis of the carotid siphons. SKULL: No skull fracture. Moderate RIGHT temporomandibular osteoarthrosis. Stable RIGHT frontal calvarial probable hemangioma. No significant  scalp soft tissue swelling. SINUSES/ORBITS: The mastoid air-cells and included paranasal sinuses are well-aerated.The included ocular globes and orbital contents are non-suspicious. Status post bilateral ocular lens implants. OTHER: None. CT CERVICAL SPINE FINDINGS ALIGNMENT: Straightened lordosis.  Vertebral bodies in alignment. SKULL BASE AND VERTEBRAE: Cervical vertebral bodies and posterior elements are intact. Mild C3-4, C5-6 and C6-7 disc height loss with endplate spurring consistent with degenerative discs. No destructive bony lesions. C1-2 articulation maintained, mild arthropathy. SOFT TISSUES AND SPINAL CANAL: Nonacute. Mild calcific atherosclerosis carotid bifurcations. DISC LEVELS: Mild canal stenosis C3-4, C4-5, C5-6. Moderate C3-4, moderate to severe C5-6 neural foraminal narrowing. UPPER CHEST: Lung apices are clear. OTHER: None. IMPRESSION: CT HEAD: 1. Negative noncontrast CT HEAD for age. CT CERVICAL SPINE: 1. No fracture or malalignment. Electronically Signed   By: Awilda Metro M.D.   On: 02/02/2018 01:00   Dg Chest Portable 1 View  Result Date: 02/01/2018 CLINICAL DATA:  Fall, deformity. EXAM: PORTABLE CHEST 1 VIEW COMPARISON:  Chest radiograph June 25, 2005 FINDINGS: Cardiomediastinal silhouette is normal. Coronary artery calcifications versus stents. Calcified aortic arch. No pleural effusions or focal consolidations. Trachea projects midline and there is no pneumothorax. Soft tissue planes and included osseous structures are non-suspicious. Osteopenia. IMPRESSION: No acute  cardiopulmonary process. Aortic Atherosclerosis (ICD10-I70.0). Electronically Signed   By: Awilda Metro M.D.   On: 02/01/2018 22:48   Dg Femur Portable Min 2 Views Left  Result Date: 02/01/2018 CLINICAL DATA:  72 y/o  F; fall. EXAM: LEFT FEMUR PORTABLE 2 VIEWS COMPARISON:  None. FINDINGS: Three part comminuted acute fracture of the proximal left femur diaphysis with 1 shaft's with anterior and lateral  displacement of the major proximal shaft component from distal. Mild osteoarthrosis of the left hip joint with periarticular osteophytosis. Left knee joint is well maintained. Bones are demineralized. Vascular calcifications. IMPRESSION: Comminuted displaced acute fracture of proximal left femur. Electronically Signed   By: Mitzi Hansen M.D.   On: 02/01/2018 22:49    EKG: Independently reviewed. Sinus rhythm, non-specific ST-abnormality, poor baseline.   Assessment/Plan  1. Left femur fracture  - Presents with left thigh pain after a fall at home and radiographs reveal acute comminuted displaced proximal femur fracture  - Orthopedic surgery is consulting and much appreciated  - Cardiology evaluation has been requested in light of elevated troponin; she may require stress testing prior to surgery  - Correct electrolytes, continue gentle IVF hydration, continue analgesia and supportive care    2. Elevated troponin  - Troponin elevated to 0.55 in ED  - She denies chest pain  - Most likely reflects a demand ischemia in setting of acute femur fracture  - Continue cardiac monitoring, trend troponin, request cardiology to evaluate prior to surgery    3. Hypokalemia; hypomagnesemia  - Serum potassium is 2.8 on admission, magnesium 1.5  - She was treated in ED with 40 mEq oral and 20 mEq IV potassium, and 2 g IV magnesium  - Continue cardiac monitoring, repeat chemistries in am    4. Anemia; thrombocytopenia  - Hgb is 8.4 and platelets 115k on admission  - No bleeding; no CBC available for comparison  - Type and screen, repeat CBC in am, ask blood bank to prepare a unit    5. Hypothyroidism  - Continue Synthroid    6. Type II DM  - No A1c on file  - Managed with metformin at home, held on admission  - Check CBG's and use a SSI with Novolog while in hospital   7. Adrenal insufficiency  - Increase Cortef in setting of acute illness    DVT prophylaxis: SCD's  Code Status:  Full  Family Communication: Son updated at bedside Consults called: Orthopedics, cardiology Admission status: Inpatient    Briscoe Deutscher, MD [facility] Pager [phone]  If 7PM-7AM, please contact night-coverage [email or web address] Password [email or web address]  02/02/2018, 1:17 AM

## 2018-02-02 NOTE — Anesthesia Preprocedure Evaluation (Addendum)
Anesthesia Evaluation  Patient identified by MRN, date of birth, ID band Patient confused    Reviewed: Allergy & Precautions, NPO status , Patient's Chart, lab work & pertinent test results  History of Anesthesia Complications Negative for: history of anesthetic complications  Airway Mallampati: II  TM Distance: >3 FB Neck ROM: Full    Dental  (+) Edentulous Upper, Missing, Poor Dentition, Dental Advisory Given   Pulmonary neg pulmonary ROS,    Pulmonary exam normal        Cardiovascular  Rhythm:Regular Rate:Normal + Systolic murmurs History noted. CG   Neuro/Psych negative neurological ROS  negative psych ROS   GI/Hepatic Neg liver ROS,   Endo/Other  diabetesHypothyroidism   Renal/GU      Musculoskeletal   Abdominal   Peds  Hematology  (+) anemia ,   Anesthesia Other Findings   Reproductive/Obstetrics                           Anesthesia Physical Anesthesia Plan  ASA: IV  Anesthesia Plan: General   Post-op Pain Management:    Induction: Intravenous  PONV Risk Score and Plan: 3 and Treatment may vary due to age or medical condition  Airway Management Planned: Oral ETT  Additional Equipment:   Intra-op Plan:   Post-operative Plan: Possible Post-op intubation/ventilation  Informed Consent: I have reviewed the patients History and Physical, chart, labs and discussed the procedure including the risks, benefits and alternatives for the proposed anesthesia with the patient or authorized representative who has indicated his/her understanding and acceptance.   Dental advisory given  Plan Discussed with: CRNA and Anesthesiologist  Anesthesia Plan Comments: (Discussed with Pt's family.)      Anesthesia Quick Evaluation

## 2018-02-02 NOTE — Anesthesia Postprocedure Evaluation (Signed)
Anesthesia Post Note  Patient: Joanne ChampagneVermell Tapia  Procedure(s) Performed: INTRAMEDULLARY (IM) NAIL LEFT FEMUR (Left Hip)     Patient location during evaluation: PACU Anesthesia Type: General Level of consciousness: sedated Pain management: pain level controlled Vital Signs Assessment: post-procedure vital signs reviewed and stable Respiratory status: spontaneous breathing and respiratory function stable Cardiovascular status: stable Postop Assessment: no apparent nausea or vomiting Anesthetic complications: no    Last Vitals:  Vitals:   02/02/18 2035 02/02/18 2053  BP:  (!) 197/81  Pulse:  77  Resp:  18  Temp: 36.5 C 36.5 C  SpO2:  96%    Last Pain:  Vitals:   02/02/18 2053  TempSrc: Oral  PainSc:                  [name]

## 2018-02-02 NOTE — Consult Note (Addendum)
[id number] for Consult: Troponin elevation Referring Physician: Orthopedic Surgery, [facility]  [name] is an 72 y.o. female.  HPI:   72 y/o [place] American female with type 2 DM, hearing impairment, visual impairment (?cataract), previously not on any antihypertensive therapy, admitted with left proximal femur fracture after a mechanical fall. She is now found to have troponin elevation. Cardiology thus consulted.  Patient lives independently, does not drive. History is very limited due to patients hearing impairment, thus obtained through her sons. No cardiac history per the sons. No chest pain or shortness of breath reported.  EKG shows LVH but no ischemic changes. Undulating troponin 0.2-0.9 ng/mL. Anemia with Hb 8-10 g/dL. BP was elevated in the hospital up to 200/75 mmHg.   Past Medical History:  Diagnosis Date  . Type 2 diabetes mellitus ([place])     History reviewed. No pertinent surgical history.  Family History  Problem Relation Age of Onset  . Vitiligo Son     Social History:  reports that she has never smoked. She does not have any smokeless tobacco history on file. Her alcohol and drug histories are not on file.  Allergies: No Known Allergies  Medications: I have reviewed the patient's current medications.  Results for orders placed or performed during the hospital encounter of 02/01/18 (from the past 48 hour(s))  CBC     Status: Abnormal   Collection Time: 02/01/18 10:27 PM  Result Value Ref Range   WBC 9.4 4.0 - 10.5 K/uL   RBC 3.20 (L) 3.87 - 5.11 MIL/uL   Hemoglobin 8.4 (L) 12.0 - 15.0 g/dL   HCT 25.1 (L) 36.0 - 46.0 %   MCV 78.4 78.0 - 100.0 fL   MCH 26.3 26.0 - 34.0 pg   MCHC 33.5 30.0 - 36.0 g/dL   RDW 14.2 11.5 - 15.5 %   Platelets 115 (L) 150 - 400 K/uL    Comment: REPEATED TO VERIFY SPECIMEN CHECKED FOR CLOTS PLATELET COUNT CONFIRMED BY SMEAR Performed at [facility], [address], [place], [place] [address]   Basic  metabolic panel     Status: Abnormal   Collection Time: 02/01/18 10:27 PM  Result Value Ref Range   Sodium 141 135 - 145 mmol/L   Potassium 2.8 (L) 3.5 - 5.1 mmol/L   Chloride 118 (H) 101 - 111 mmol/L   CO2 16 (L) 22 - 32 mmol/L   Glucose, Bld 95 65 - 99 mg/dL   BUN 13 6 - 20 mg/dL   Creatinine, Ser 0.79 0.44 - 1.00 mg/dL   Calcium 6.6 (L) 8.9 - 10.3 mg/dL   GFR calc non Af Amer >60 >60 mL/min   GFR calc Af Amer >60 >60 mL/min    Comment: (NOTE) The eGFR has been calculated using the CKD EPI equation. This calculation has not been validated in all clinical situations. eGFR's persistently <60 mL/min signify possible Chronic Kidney Disease.    Anion gap 7 5 - 15    Comment: Performed at [facility] [address] [place], [place] [address]  CK     Status: None   Collection Time: 02/01/18 10:59 PM  Result Value Ref Range   Total CK 98 38 - 234 U/L    Comment: Performed at [facility], [place] [address] [place], [place] [address]  Type and screen [place]     Status: None (Preliminary result)   Collection Time: 02/01/18 11:14 PM  Result Value Ref Range  ABO/RH(D) O POS    Antibody Screen NEG    Sample Expiration      02/04/2018 Performed at [facility], [place] [address], [place], [place] [address]    Unit Number [id number]    Blood Component Type RED CELLS,LR    Unit division 00    Status of Unit ALLOCATED    Transfusion Status OK TO TRANSFUSE    Crossmatch Result Compatible   ABO/Rh     Status: None   Collection Time: 02/01/18 11:14 PM  Result Value Ref Range   ABO/RH(D)      O POS Performed at [facility] [address], [place], [place] [address]   Protime-INR     Status: None   Collection Time: 02/01/18 11:16 PM  Result Value Ref Range   Prothrombin Time 12.5 11.4 - 15.2 seconds   INR 0.95     Comment: Performed at [facility] [address], [place], [place] [address]  Hepatic function panel     Status:  Abnormal   Collection Time: 02/01/18 11:16 PM  Result Value Ref Range   Total Protein 4.5 (L) 6.5 - 8.1 g/dL   Albumin 2.5 (L) 3.5 - 5.0 g/dL   AST 21 15 - 41 U/L   ALT 6 (L) 14 - 54 U/L   Alkaline Phosphatase 37 (L) 38 - 126 U/L   Total Bilirubin 0.7 0.3 - 1.2 mg/dL   Bilirubin, Direct 0.2 0.1 - 0.5 mg/dL   Indirect Bilirubin 0.5 0.3 - 0.9 mg/dL    Comment: Performed at [facility] [address] [place], [place] [address]  Magnesium     Status: Abnormal   Collection Time: 02/01/18 11:16 PM  Result Value Ref Range   Magnesium 1.5 (L) 1.7 - 2.4 mg/dL    Comment: Performed at [facility] [address], [place], [place] [address]  I-Stat Troponin, ED (not at [facility])     Status: Abnormal   Collection Time: 02/01/18 11:17 PM  Result Value Ref Range   Troponin i, poc 0.55 (HH) 0.00 - 0.08 ng/mL   Comment NOTIFIED PHYSICIAN    Comment 3            Comment: Due to the release kinetics of cTnI, a negative result within the first hours of the onset of symptoms does not rule out myocardial infarction with certainty. If myocardial infarction is still suspected, repeat the test at appropriate intervals.   Troponin I (q 6hr x 3)     Status: Abnormal   Collection Time: 02/02/18 12:15 AM  Result Value Ref Range   Troponin I 0.29 (HH) <0.03 ng/mL    Comment: CRITICAL RESULT CALLED TO, READ BACK BY AND VERIFIED WITH: MCCORD S,RN 02/02/18 0106 WAYK Performed at [facility], [address], [place], [place] [address]   Prepare RBC     Status: None   Collection Time: 02/02/18  1:30 AM  Result Value Ref Range   Order Confirmation      ORDER PROCESSED BY BLOOD BANK Performed at [facility], [place] [address], [place], [place] [address]   CBG monitoring, ED     Status: None   Collection Time: 02/02/18  2:07 AM  Result Value Ref Range   Glucose-Capillary 98 65 - 99 mg/dL   Comment 1 Notify RN    Comment 2 Document in Chart   Troponin I (q 6hr x 3)  Status:  Abnormal   Collection Time: 02/02/18  3:28 AM  Result Value Ref Range   Troponin I 0.92 (HH) <0.03 ng/mL    Comment: CRITICAL VALUE NOTED.  VALUE IS CONSISTENT WITH PREVIOUSLY REPORTED AND CALLED VALUE. Performed at [facility], [place] [address] [place], [place] [address]   Vitamin B12     Status: Abnormal   Collection Time: 02/02/18  3:28 AM  Result Value Ref Range   Vitamin B-12 1,144 (H) 180 - 914 pg/mL    Comment: (NOTE) This assay is not validated for testing neonatal or myeloproliferative syndrome specimens for Vitamin B12 levels. Performed at [facility], [place] [address], [place], [place] [address]   Folate     Status: None   Collection Time: 02/02/18  3:28 AM  Result Value Ref Range   Folate 26.1 >5.9 ng/mL    Comment: Performed at [facility] [address], [place], [place] [address]  Iron and TIBC     Status: Abnormal   Collection Time: 02/02/18  3:28 AM  Result Value Ref Range   Iron 14 (L) 28 - 170 ug/dL   TIBC 176 (L) 250 - 450 ug/dL   Saturation Ratios 8 (L) 10.4 - 31.8 %   UIBC 162 ug/dL    Comment: Performed at [facility], [place] [address] [place], [place] [address]  Ferritin     Status: None   Collection Time: 02/02/18  3:28 AM  Result Value Ref Range   Ferritin 242 11 - 307 ng/mL    Comment: Performed at [facility], [place] [address], [place], [place] [address]  Reticulocytes     Status: Abnormal   Collection Time: 02/02/18  3:28 AM  Result Value Ref Range   Retic Ct Pct 1.1 0.4 - 3.1 %   RBC. 3.83 (L) 3.87 - 5.11 MIL/uL   Retic Count, Absolute 42.1 19.0 - 186.0 K/uL    Comment: Performed at [facility] [address], [place], [place] [address]  Comprehensive metabolic panel     Status: Abnormal   Collection Time: 02/02/18  3:28 AM  Result Value Ref Range   Sodium 140 135 - 145 mmol/L   Potassium 3.7 3.5 - 5.1 mmol/L   Chloride 107 101 - 111 mmol/L   CO2 24 22 - 32 mmol/L   Glucose, Bld 113 (H) 65 - 99 mg/dL    BUN 14 6 - 20 mg/dL   Creatinine, Ser 1.12 (H) 0.44 - 1.00 mg/dL   Calcium 8.7 (L) 8.9 - 10.3 mg/dL   Total Protein 5.1 (L) 6.5 - 8.1 g/dL   Albumin 3.0 (L) 3.5 - 5.0 g/dL   AST 24 15 - 41 U/L   ALT 10 (L) 14 - 54 U/L   Alkaline Phosphatase 44 38 - 126 U/L   Total Bilirubin 0.7 0.3 - 1.2 mg/dL   GFR calc non Af Amer 48 (L) >60 mL/min   GFR calc Af Amer 55 (L) >60 mL/min    Comment: (NOTE) The eGFR has been calculated using the CKD EPI equation. This calculation has not been validated in all clinical situations. eGFR's persistently <60 mL/min signify possible Chronic Kidney Disease.    Anion gap 9 5 - 15    Comment: Performed at [facility] [address] [place], [place] [address]  CBC     Status: Abnormal   Collection Time: 02/02/18  3:28 AM  Result Value Ref  Range   WBC 12.2 (H) 4.0 - 10.5 K/uL   RBC 3.83 (L) 3.87 - 5.11 MIL/uL   Hemoglobin 10.0 (L) 12.0 - 15.0 g/dL   HCT 30.2 (L) 36.0 - 46.0 %   MCV 78.9 78.0 - 100.0 fL   MCH 26.1 26.0 - 34.0 pg   MCHC 33.1 30.0 - 36.0 g/dL   RDW 14.2 11.5 - 15.5 %   Platelets 174 150 - 400 K/uL    Comment: Performed at [facility] [address], [place], [place] [address]  Magnesium     Status: Abnormal   Collection Time: 02/02/18  3:28 AM  Result Value Ref Range   Magnesium 2.6 (H) 1.7 - 2.4 mg/dL    Comment: Performed at [facility] [address] [place], [place] [address]  Glucose, capillary     Status: None   Collection Time: 02/02/18  8:43 AM  Result Value Ref Range   Glucose-Capillary 79 65 - 99 mg/dL   Comment 1 Notify RN    Comment 2 Document in Chart     Ct Head Wo Contrast  Result Date: 02/02/2018 CLINICAL DATA:  Headache after fall.  History of diabetes. EXAM: CT HEAD WITHOUT CONTRAST CT CERVICAL SPINE WITHOUT CONTRAST TECHNIQUE: Multidetector CT imaging of the head and cervical spine was performed following the standard protocol without intravenous contrast. Multiplanar CT image  reconstructions of the cervical spine were also generated. COMPARISON:  MRI of the head May 21, 2004 and CT HEAD May 17, 2004 FINDINGS: CT HEAD FINDINGS BRAIN: No intraparenchymal hemorrhage, mass effect nor midline shift. The ventricles and sulci are normal for age cavum septum pellucidum et vergae. Patchy supratentorial white matter hypodensities less than expected for patient's age, though non-specific are most compatible with chronic small vessel ischemic disease. No acute large vascular territory infarcts. No abnormal extra-axial fluid collections. Basal cisterns are patent. VASCULAR: Moderate calcific atherosclerosis of the carotid siphons. SKULL: No skull fracture. Moderate RIGHT temporomandibular osteoarthrosis. Stable RIGHT frontal calvarial probable hemangioma. No significant scalp soft tissue swelling. SINUSES/ORBITS: The mastoid air-cells and included paranasal sinuses are well-aerated.The included ocular globes and orbital contents are non-suspicious. Status post bilateral ocular lens implants. OTHER: None. CT CERVICAL SPINE FINDINGS ALIGNMENT: Straightened lordosis.  Vertebral bodies in alignment. SKULL BASE AND VERTEBRAE: Cervical vertebral bodies and posterior elements are intact. Mild C3-4, C5-6 and C6-7 disc height loss with endplate spurring consistent with degenerative discs. No destructive bony lesions. C1-2 articulation maintained, mild arthropathy. SOFT TISSUES AND SPINAL CANAL: Nonacute. Mild calcific atherosclerosis carotid bifurcations. DISC LEVELS: Mild canal stenosis C3-4, C4-5, C5-6. Moderate C3-4, moderate to severe C5-6 neural foraminal narrowing. UPPER CHEST: Lung apices are clear. OTHER: None. IMPRESSION: CT HEAD: 1. Negative noncontrast CT HEAD for age. CT CERVICAL SPINE: 1. No fracture or malalignment. Electronically Signed   By: Elon Alas M.D.   On: 02/02/2018 01:00   Ct Cervical Spine Wo Contrast  Result Date: 02/02/2018 CLINICAL DATA:  Headache after fall.   History of diabetes. EXAM: CT HEAD WITHOUT CONTRAST CT CERVICAL SPINE WITHOUT CONTRAST TECHNIQUE: Multidetector CT imaging of the head and cervical spine was performed following the standard protocol without intravenous contrast. Multiplanar CT image reconstructions of the cervical spine were also generated. COMPARISON:  MRI of the head May 21, 2004 and CT HEAD May 17, 2004 FINDINGS: CT HEAD FINDINGS BRAIN: No intraparenchymal hemorrhage, mass effect nor midline shift. The ventricles and sulci are normal for age cavum septum pellucidum et vergae. Patchy  supratentorial white matter hypodensities less than expected for patient's age, though non-specific are most compatible with chronic small vessel ischemic disease. No acute large vascular territory infarcts. No abnormal extra-axial fluid collections. Basal cisterns are patent. VASCULAR: Moderate calcific atherosclerosis of the carotid siphons. SKULL: No skull fracture. Moderate RIGHT temporomandibular osteoarthrosis. Stable RIGHT frontal calvarial probable hemangioma. No significant scalp soft tissue swelling. SINUSES/ORBITS: The mastoid air-cells and included paranasal sinuses are well-aerated.The included ocular globes and orbital contents are non-suspicious. Status post bilateral ocular lens implants. OTHER: None. CT CERVICAL SPINE FINDINGS ALIGNMENT: Straightened lordosis.  Vertebral bodies in alignment. SKULL BASE AND VERTEBRAE: Cervical vertebral bodies and posterior elements are intact. Mild C3-4, C5-6 and C6-7 disc height loss with endplate spurring consistent with degenerative discs. No destructive bony lesions. C1-2 articulation maintained, mild arthropathy. SOFT TISSUES AND SPINAL CANAL: Nonacute. Mild calcific atherosclerosis carotid bifurcations. DISC LEVELS: Mild canal stenosis C3-4, C4-5, C5-6. Moderate C3-4, moderate to severe C5-6 neural foraminal narrowing. UPPER CHEST: Lung apices are clear. OTHER: None. IMPRESSION: CT HEAD: 1. Negative  noncontrast CT HEAD for age. CT CERVICAL SPINE: 1. No fracture or malalignment. Electronically Signed   By: Elon Alas M.D.   On: 02/02/2018 01:00   Dg Chest Portable 1 View  Result Date: 02/01/2018 CLINICAL DATA:  Fall, deformity. EXAM: PORTABLE CHEST 1 VIEW COMPARISON:  Chest radiograph June 25, 2005 FINDINGS: Cardiomediastinal silhouette is normal. Coronary artery calcifications versus stents. Calcified aortic arch. No pleural effusions or focal consolidations. Trachea projects midline and there is no pneumothorax. Soft tissue planes and included osseous structures are non-suspicious. Osteopenia. IMPRESSION: No acute cardiopulmonary process. Aortic Atherosclerosis (ICD10-I70.0). Electronically Signed   By: Elon Alas M.D.   On: 02/01/2018 22:48   Dg Femur Portable Min 2 Views Left  Result Date: 02/01/2018 CLINICAL DATA:  72 y/o  F; fall. EXAM: LEFT FEMUR PORTABLE 2 VIEWS COMPARISON:  None. FINDINGS: Three part comminuted acute fracture of the proximal left femur diaphysis with 1 shaft's with anterior and lateral displacement of the major proximal shaft component from distal. Mild osteoarthrosis of the left hip joint with periarticular osteophytosis. Left knee joint is well maintained. Bones are demineralized. Vascular calcifications. IMPRESSION: Comminuted displaced acute fracture of proximal left femur. Electronically Signed   By: Kristine Garbe M.D.   On: 02/01/2018 22:49    EKG 02/02/2018: Sinus rhythm 78 bpm. LVH. No ischemic changes.  Echocardiogram pending  Review of Systems  Unable to perform ROS: Other  Severe hearing and visual impairment. Does not report any chest pain or shortness of breath. Reports left leg pain  Blood pressure (!) 119/52, pulse 77, temperature 99 F (37.2 C), temperature source Oral, resp. rate 16, height _0  (1.6 m), weight 54.4 kg (120 lb), SpO2 100 %. Physical Exam  Nursing note and vitals reviewed. Constitutional: She appears  well-developed and well-nourished. She appears distressed.  HENT:  Head: Normocephalic and atraumatic.  Eyes:  Not examined   Neck: Normal range of motion. Neck supple. No JVD present.  Cardiovascular: Normal rate and regular rhythm.  Murmur (II/VI ESM aortic area) heard. Respiratory: Effort normal and breath sounds normal. She has no wheezes. She has no rales.  GI: Soft. Bowel sounds are normal. She exhibits no distension. There is no tenderness.  Musculoskeletal:  Rt leg immobilized  Lymphadenopathy:    She has no cervical adenopathy.  Neurological: She is alert.  No detailed exam performed  Skin: Skin is warm and dry.    Assessment/Plan:  72 y/o African  American female  Mechanical fall, left prox femur Fx Troponin elevation, perioperative cardiac risk evaluation:  No chest pain, shortness of breath reported. No ischemic EKG changes. Echocardiogram pending. Likely type 2 MI in the setting of anemia, femur fracture, uncontrolled hypertension. Certainly, she has high risk for CAD given her DM, untreated hypertension, and age. Her perioperative cardiac risk remains high. However, I do not think it would be mitigated with invasive assessment. If she does have severe CAD, revascularization will not reduce perioperative risk and would certainly delay her hip fracture surgery, which could increase her morbidity and mortality. Will follow up on the echocardiogram findings. I recommend medical management with ASA 81 mg, heparin ggt, lipitor 80 mg. Recommend blood pressure control with prn medications as you are doing, at the same time avoiding perioperative hypotension. Family understands that her surgical risk will be high, but morbidity/mortality remains high without surgical correction of the fracture. Type 2 DM Hypertension Anemia  [name] J Cleve Paolillo 02/02/2018, 10:01 AM    [place], MD [facility] Pager: [phone] Office: [phone] If no answer  Cell [phone]

## 2018-02-02 NOTE — Anesthesia Procedure Notes (Signed)
Procedure Name: Intubation Date/Time: 02/02/2018 3:44 PM Performed by: Nils PyleBell, Lethia Donlon T, CRNA Pre-anesthesia Checklist: Patient identified, Emergency Drugs available, Suction available and Patient being monitored Patient Re-evaluated:Patient Re-evaluated prior to induction Oxygen Delivery Method: Circle System Utilized Preoxygenation: Pre-oxygenation with 100% oxygen Induction Type: IV induction Ventilation: Mask ventilation without difficulty Laryngoscope Size: Miller and 2 Grade View: Grade I Tube type: Oral Tube size: 7.5 mm Number of attempts: 1 Airway Equipment and Method: Stylet and Oral airway Placement Confirmation: ETT inserted through vocal cords under direct vision,  positive ETCO2 and breath sounds checked- equal and bilateral Secured at: 22 cm Tube secured with: Tape Dental Injury: Teeth and Oropharynx as per pre-operative assessment

## 2018-02-02 NOTE — Progress Notes (Signed)
SLP Cancellation Note  Patient Details Name: [name] MRN: [id number] DOB: [birth year]   Cancelled treatment:       Reason Eval/Treat Not Completed: Medical issues which prohibited therapy. Possible surgery this pm. Will defer swallow eval until surgery completed.    Lundyn Coste, Riley NearingBonnie Caroline 02/02/2018, 10:52 AM

## 2018-02-02 NOTE — Progress Notes (Signed)
  Echocardiogram 2D Echocardiogram has been performed.  Janalyn HarderWest, Herold Salguero R 02/02/2018, 1:42 PM

## 2018-02-02 NOTE — Op Note (Signed)
OrthopaedicSurgeryOperativeNote (ZOX:[id number]:[id number]) Date of Surgery: 02/02/2018  Admit Date: 02/01/2018   Diagnoses: Pre-Op Diagnoses: Left femur fracture  Post-Op Diagnosis: Same  Procedures: 1. CPT 27506-Intramedullary nailing of left femur 2. CPT 20650-Placement and removal of distal femoral traction pin  Surgeons: Primary: Haddix, Gillie MannersKevin P, MD   Assistant: Montez MoritaKeith Paul, PA-C   Location:[facility] OR ROOM 12   AnesthesiaGeneral   Antibiotics:Ancef 2g preop   Tourniquettime:None  EstimatedBloodLoss:300 mL   Complications:None  Specimens:None  Implants: Implant Name Type Inv. Item Serial No. Manufacturer Lot No. LRB No. Used Action  10MM/TI CANN FRN/PF Nail   DEPUY SYNTHES [id number] Left 1 Implanted  SCREW 6.5MM W/T25 STARDRIVE - [id number] Screw SCREW 6.5MM W/T25 STARDRIVE  SYNTHES TRAUMA [id number] Left 1 Implanted  SCREW RECON 6.5 X 85MM - [id number] Screw SCREW RECON 6.5 X 85MM  [employer]INE [id number] Left 1 Implanted  SCREW LOCKING 5.0X42MM - [id number] Screw SCREW LOCKING 5.0X42MM  SYNTHES TRAUMA [id number] Left 1 Implanted  SCREW LOCKING 5.0X46MM - [id number] Screw SCREW LOCKING 5.0X46MM  SYNTHES TRAUMA [id number] Left 1 Implanted  SCREW LOCKING 5.0X54MM - [id number] Screw SCREW LOCKING 5.0X54MM  SYNTHES TRAUMA [id number] Left 1 Implanted    IndicationsforSurgery: 72 year old female with hyperthyroidism adrenal insufficiency and diabetes who fell at home.  She was found to have a femur fracture.  She is extremely hard of hearing so most history is obtained by the son.  She has no significant cardiac history but upon arrival she had a slight hypertension and an elevated troponins.  Cardiology was consulted and they had her undergo an echocardiogram.  That returned without significant abnormalities as well as no significant intervention needed to be performed prior to proceeding with surgery.    I discussed risks and benefits of surgery with the patient's son about  proceeding with intramedullary nailing of left femur fracture. Risks discussed included bleeding requiring blood transfusion, bleeding causing a hematoma, infection, malunion, nonunion, damage to surrounding nerves and blood vessels, pain, hardware prominence or irritation, hardware failure, stiffness, post-traumatic arthritis, DVT/PE, compartment syndrome, and even death. He agrees to proceed with surgical fixation of her left femur.  Operative Findings: 1. Intramedullary nailing of left femur fracture using [employer] FRN Piriformis entry recon nail 10x36460mm 2. Placement of distal femoral traction pin to assist with reduction, removed at end of procedure.  Procedure: The patient was identified in the preoperative holding area. Consent was confirmed with the family and all questions were answered. The operative extremity was marked and the patient was then brought back to the operating room by our anesthesia colleagues. She was carefully transferred to a radiolucent flat-top table. A bump was placed and secured under the operative extremity. Contralateral films were obtained for a rotational profile of the femur using an AP of the knee with the patella in the center and the profile of the lesser trochanter. The operative extremity was then prepped and draped in sterile fashion. A pre incision timeout was performed to verify the patient, the procedure and the extremity. Preoperative antibiotics were dosed.   A distal femoral traction pin was placed from medial to lateral. A sterile traction bow was placed around the traction pin. The hip and knee were flexed over a triangle. AP and lateral view were obtained to identify a correct incision and the skin and subcutaneous fat were incised above the greater trochanter. A curved mayo scissors was used to spread inline with the hip abductors to the piriformis fossa. A threaded guidepin was used to identify the correct  starting point. AP and lateral views were used to  advance the pin to just below the lesser trochanter. A entry reamer was used to enter the canal and the guidepin and reamer were removed.  A bent ball-tip guidewire was sent done the canal. A reduction maneuver was performed with traction, a towel bump under the leg and adduction. The ball-tip guidewire was eventually passed in the distal segment. AP and lateral fluoro was used to make sure the path of the guidewire was center-center in the distal part of the femur. This was seated down into the physeal scar. The length of the nail was measured and we chose a 360 nail. An anterior incision was made over the proximal femur and a ball-spiked pusher was used to reduced the flexion of the proximal segment and I sequentially reamed from a 8.46mm to 11.5 mm with adequate chatter at the end. I was careful with reaming across the fracture to prevent any comminution. The nail was then passed down the center of the canal.  The nail was seated until it was flush with the piriformis entry. The targeting device for recon screws were placed and two recon screws and a transverse proximal screw was placed.  Once we had the proximal segment locked to the nail we focused on rotation and alignment. A rotational profile was done to compare to the contralateral leg and it was symmetric. Using perfect circle technique, two distal interlocks were placed in the nail.  The insertion handle was removed an final x-rays were obtained. The incisions were then irrigated and 500mg  of vancomycin powder was placed in the wounds. The incisions were then closed with 2-0 vicryl and 3-0 nylon. The incisions were dressed with mepilex. Rotation was checked clinically compared to the contralateral side and was nearly identical. The patient was then awoken from anesthesia, transferred to his floor bed and taken to the PACU in stable condition.  Post Op Plan/Instructions: Weight bearing as tolerated left lower extremity. Mobilize with physical  and occupational therapy. Postoperative ancef. Will recommend waiting until at least postoperative day 1 before considering heparin anticoagulation.  I was present and performed the entire surgery.  Montez Morita, PA-C did assist me throughout the case. An assistant was necessary given the difficulty in approach, maintenance of reduction and ability to instrument the fracture.   Truitt Merle, MD [facility]

## 2018-02-02 NOTE — Progress Notes (Signed)
Orthopedic Tech Progress Note Patient Details:  Kerrin ChampagneVermell Tedrick [birth year] [id number]  Musculoskeletal Traction Type of Traction: Bucks Skin Traction Traction Location: lle Traction Weight: 7 lbs   Post Interventions Patient Tolerated: Well Instructions Provided: Care of device, Adjustment of device Removed hare traction and held traction while applying bucks.  Trinna PostMartinez, Jonique Kulig J 02/02/2018, 12:50 AM

## 2018-02-03 ENCOUNTER — Inpatient Hospital Stay (HOSPITAL_COMMUNITY): Payer: Medicare Other

## 2018-02-03 ENCOUNTER — Encounter (HOSPITAL_COMMUNITY): Payer: Self-pay | Admitting: General Practice

## 2018-02-03 LAB — CBC
HEMATOCRIT: 43.3 % (ref 36.0–46.0)
HEMOGLOBIN: 15 g/dL (ref 12.0–15.0)
MCH: 28.4 pg (ref 26.0–34.0)
MCHC: 34.6 g/dL (ref 30.0–36.0)
MCV: 81.9 fL (ref 78.0–100.0)
Platelets: 99 10*3/uL — ABNORMAL LOW (ref 150–400)
RBC: 5.29 MIL/uL — AB (ref 3.87–5.11)
RDW: 15.4 % (ref 11.5–15.5)
WBC: 11.7 10*3/uL — ABNORMAL HIGH (ref 4.0–10.5)

## 2018-02-03 LAB — COMPREHENSIVE METABOLIC PANEL
ALBUMIN: 3 g/dL — AB (ref 3.5–5.0)
ALT: 103 U/L — ABNORMAL HIGH (ref 14–54)
AST: 172 U/L — AB (ref 15–41)
Alkaline Phosphatase: 47 U/L (ref 38–126)
Anion gap: 9 (ref 5–15)
BUN: 12 mg/dL (ref 6–20)
CO2: 20 mmol/L — ABNORMAL LOW (ref 22–32)
Calcium: 8.2 mg/dL — ABNORMAL LOW (ref 8.9–10.3)
Chloride: 110 mmol/L (ref 101–111)
Creatinine, Ser: 1.21 mg/dL — ABNORMAL HIGH (ref 0.44–1.00)
GFR calc Af Amer: 50 mL/min — ABNORMAL LOW (ref >60)
GFR, EST NON AFRICAN AMERICAN: 43 mL/min — AB (ref >60)
GLUCOSE: 171 mg/dL — AB (ref 65–99)
POTASSIUM: 5 mmol/L (ref 3.5–5.1)
SODIUM: 139 mmol/L (ref 135–145)
Total Bilirubin: 1.6 mg/dL — ABNORMAL HIGH (ref 0.3–1.2)
Total Protein: 5.4 g/dL — ABNORMAL LOW (ref 6.5–8.1)

## 2018-02-03 LAB — HEMOGLOBIN AND HEMATOCRIT, BLOOD
HEMATOCRIT: 42.8 % (ref 36.0–46.0)
HEMOGLOBIN: 14.6 g/dL (ref 12.0–15.0)

## 2018-02-03 LAB — GLUCOSE, CAPILLARY
GLUCOSE-CAPILLARY: 138 mg/dL — AB (ref 65–99)
Glucose-Capillary: 179 mg/dL — ABNORMAL HIGH (ref 65–99)
Glucose-Capillary: 197 mg/dL — ABNORMAL HIGH (ref 65–99)
Glucose-Capillary: 208 mg/dL — ABNORMAL HIGH (ref 65–99)

## 2018-02-03 LAB — MAGNESIUM: MAGNESIUM: 2.2 mg/dL (ref 1.7–2.4)

## 2018-02-03 LAB — PREPARE RBC (CROSSMATCH)

## 2018-02-03 MED ORDER — METOPROLOL TARTRATE 12.5 MG HALF TABLET
12.5000 mg | ORAL_TABLET | Freq: Two times a day (BID) | ORAL | Status: DC
  Administered 2018-02-03 – 2018-02-06 (×6): 12.5 mg via ORAL
  Filled 2018-02-03 (×6): qty 1

## 2018-02-03 MED ORDER — CEFAZOLIN SODIUM-DEXTROSE 1-4 GM/50ML-% IV SOLN
1.0000 g | Freq: Three times a day (TID) | INTRAVENOUS | Status: AC
  Administered 2018-02-03 – 2018-02-04 (×3): 1 g via INTRAVENOUS
  Filled 2018-02-03 (×3): qty 50

## 2018-02-03 MED ORDER — HEPARIN SODIUM (PORCINE) 5000 UNIT/ML IJ SOLN
5000.0000 [IU] | Freq: Three times a day (TID) | INTRAMUSCULAR | Status: AC
  Administered 2018-02-03 – 2018-02-04 (×4): 5000 [IU] via SUBCUTANEOUS
  Filled 2018-02-03 (×4): qty 1

## 2018-02-03 MED ORDER — ATORVASTATIN CALCIUM 80 MG PO TABS
80.0000 mg | ORAL_TABLET | Freq: Every day | ORAL | Status: DC
  Administered 2018-02-04 – 2018-02-06 (×3): 80 mg via ORAL
  Filled 2018-02-03 (×3): qty 1

## 2018-02-03 MED ORDER — ASPIRIN 81 MG PO CHEW
81.0000 mg | CHEWABLE_TABLET | Freq: Every day | ORAL | Status: DC
  Administered 2018-02-04 – 2018-02-06 (×3): 81 mg via ORAL
  Filled 2018-02-03 (×3): qty 1

## 2018-02-03 MED ORDER — FENTANYL CITRATE (PF) 100 MCG/2ML IJ SOLN: 25.0000 ug | INTRAMUSCULAR | Status: DC | PRN

## 2018-02-03 MED ORDER — SODIUM CHLORIDE 0.9 % IV SOLN
Freq: Once | INTRAVENOUS | Status: AC
  Administered 2018-02-03: 13:00:00 via INTRAVENOUS

## 2018-02-03 NOTE — Progress Notes (Addendum)
PROGRESS NOTE    [name]  WUJ:[id number] DOB: [birth year] DOA: 02/01/2018 PCP: [name], [place], MD   Brief Narrative:   [name] 72 y.o. female with medical history significant for deafness, hypothyroidism, adrenal insufficiency, and type 2 diabetes mellitus, now presenting to the emergency department for evaluation of the left thigh pain.  Patient fell at home on 02/01/2018, was found later in the day by family, she was unable to bear weight, and was sent to the ED for further evaluation.  She had reportedly been in her usual state of health prior to the fall, denies recent fevers or chills, denied chest pain, and denies shortness of breath or cough.  Per the report of her son at the bedside, she is typically able to ascend [name] flight of stairs without shortness of breath and does not have any known history of heart disease.  Admitted for further management of L femur fracture.  Assessment & Plan:   Principal Problem:   Femur fracture, left (HCC) Active Problems:   Anemia   Thrombocytopenia (HCC)   Hypokalemia   Hypomagnesemia   Adrenal insufficiency (HCC)   Hypothyroidism   Type II diabetes mellitus (HCC)   Elevated troponin    1. Left femur fracture  - Presents with left thigh pain after [name] fall at home and radiographs reveal acute comminuted displaced proximal femur fracture  - Orthopedic surgery is consulting and much appreciated  - Cardiology evaluation has been requested in light of elevated troponin.  Appreciate Dr. Damian Tapia's assistance.  - Now s/p IM nail of L femur and placement/removal of distal femoral traction pin on 4/25 - Will discuss plan for DVT ppx with orthopedics - Correct electrolytes, continue gentle IVF hydration, continue analgesia and supportive care    2. Elevated troponin  - Troponin elevated to 0.55 in ED.  Peaked to 0.92, now downtrending. - She denies chest pain  - Most likely reflects Breslin Burklow demand ischemia in setting of acute femur  fracture  - Continue cardiac monitoring, trend troponin, request cardiology to evaluate prior to surgery  - appreciate Dr. Rosemary Tapia.  Recommending ASA, heparin gtt, lipitor.  Follow echo (see below).  Recommending medical management. - Of note, hyperdynamic systolic function with increased LVOT gradients.  Will need to f/u with cards as outpatient for repeat echo.  3. Hypokalemia; hypomagnesemia  - improved.  Stable  4. Anemia; thrombocytopenia  - continue to monitor.   - Hgb is 8.4 and platelets 115k on admission.  Improved on 4/25 AM, but then dropped in the afternoon - The Hb of 4 was likely spurious as she received 4 units pRBC and now her Hb is 14.6.  - Will continue to follow her H/H and platelet count.  Will discuss plan for DVT ppx with ortho as noted above, but suspect will be ok to restart.   - iron panel notable for low iron.  Normal folate and b12.  5. Hypothyroidism  - Continue Synthroid    6. Type II DM  - No A1c on file  - Managed with metformin at home, held on admission  - Check CBG's and use [name] SSI with Novolog while in hospital   7. Adrenal insufficiency  - Increase Cortef in setting of acute illness   # AKI: baseline < 1, worsening since presentation.  Follow UA.  Continue IVF.  I/O, daily weights.  # Elevated LFTs: follow hepatitis panel, follow RUQ US  # NAGMA: follow with IVF.  Possibly related to AKI?  #  Patient is deaf and has difficulty seeing:  Typically communicates with white board or pen and paper.  Her son is very helpful in this aspect, but notes he's having trouble communicating with her here because she doesn't have her glasses.    # Delirium: Doing better this morning, but still confused.  Difficult to orient her with her problems with vision and hearing.  Son notes she seems better than yesterday as well, but vision limiting his ability to communicate with her.  Will continue to monitor.   Delirium precautions.   Continue to  monitor.  DVT prophylaxis: SCD's, will discuss lovenox with ortho  Code Status: full  Family Communication: discussed with son at bedside Disposition Plan: pending improvement   Consultants:   Ortho  cardiology  Procedures:   IM nail of L femur.  Placement and removal of distal femoral traction pin. 4/25. Echo 4/25 Study Conclusions  - Left ventricle: The cavity size was normal. There was focal basal   hypertrophy. No SAM. Increased LVOT gradients likely due to   hyperdynamic LV. Systolic function was hyperdynamic. The   estimated ejection fraction was in the range of 70% to 75%. Wall   motion was normal; there were no regional wall motion   abnormalities. Doppler parameters are consistent with abnormal   left ventricular relaxation (grade 1 diastolic dysfunction). - No significant valvular abnormality.  Recommendations:  Avoid perioperative hypotension. Recommend maintaining adequate volume status with fluids +/- blood transfusion. In case of hypotension, consider IV phenylephrine to avoid further LVOT obstruction.  4/26  Intramedullary nailing of left femur Placement and removal of distal femoral traction pin  Antimicrobials:  Anti-infectives (From admission, onward)   Start     Dose/Rate Route Frequency Ordered Stop   02/03/18 1100  ceFAZolin (ANCEF) IVPB 1 g/50 mL premix     1 g 100 mL/hr over 30 Minutes Intravenous Every 8 hours 02/03/18 1028 02/04/18 1059   02/02/18 1727  vancomycin (VANCOCIN) powder  Status:  Discontinued       As needed 02/02/18 1727 02/02/18 1758   02/02/18 1515  ceFAZolin (ANCEF) IVPB 2g/100 mL premix     2 g 200 mL/hr over 30 Minutes Intravenous  Once 02/02/18 1511 02/02/18 1600   02/02/18 1507  ceFAZolin (ANCEF) 2-4 GM/100ML-% IVPB    Note to Pharmacy:  [name]   : cabinet override      02/02/18 1507 02/02/18 1550    \     Subjective: Asking her son to put eyedrop in her R eye. Unable to effectively communicate with  vision and hearing impairments.  Son at bedside.  Objective: Vitals:   02/02/18 2300 02/03/18 0047 02/03/18 0517 02/03/18 1459  BP: (!) 217/86 (!) 185/84 (!) 183/69 (!) 143/57  Pulse: 91 80 81 74  Resp:  16 16 13   Temp:  98 F (36.7 C) 98.1 F (36.7 C)   TempSrc:  Oral Oral   SpO2:  100% 98% 99%  Weight:      Height:        Intake/Output Summary (Last 24 hours) at 02/03/2018 1635 Last data filed at 02/03/2018 0900 Gross per 24 hour  Intake 1896.33 ml  Output 850 ml  Net 1046.33 ml   Filed Weights   02/01/18 2215  Weight: 54.4 kg (120 lb)    Examination:  General: No acute distress. Cardiovascular: Heart sounds show Merlina Marchena regular rate, and rhythm. No gallops or rubs. No murmurs. No JVD. Lungs: Clear to auscultation bilaterally with good  air movement. No rales, rhonchi or wheezes. Abdomen: Soft, nontender, nondistended with normal active bowel sounds. No masses. No hepatosplenomegaly. Neurological: Disoriented Moves all extremities 4.  Skin: LLE with dressing intact Extremities: No clubbing or cyanosis.Pedal pulses palpable. Psychiatric: Mood and affect are normal. Insight and judgment are impaired.   Data Reviewed: I have personally reviewed following labs and imaging studies  CBC: Recent Labs  Lab 02/01/18 2227 02/02/18 0328 02/02/18 1715 02/02/18 2204 02/03/18 0633 02/03/18 0949  WBC 9.4 12.2* 7.5 11.8* 11.7*  --   HGB 8.4* 10.0* 4.0* 16.3* 15.0 14.6  HCT 25.1* 30.2* 12.1* 47.6* 43.3 42.8  MCV 78.4 78.9 79.6 83.7 81.9  --   PLT 115* 174 83* 85* 99*  --    Basic Metabolic Panel: Recent Labs  Lab 02/01/18 2227 02/01/18 2316 02/02/18 0328 02/03/18 0633  NA 141  --  140 139  K 2.8*  --  3.7 5.0  CL 118*  --  107 110  CO2 16*  --  24 20*  GLUCOSE 95  --  113* 171*  BUN 13  --  14 12  CREATININE 0.79  --  1.12* 1.21*  CALCIUM 6.6*  --  8.7* 8.2*  MG  --  1.5* 2.6* 2.2   GFR: Estimated Creatinine Clearance: 34.3 mL/min (Farhana Fellows) (by C-G formula based on SCr  of 1.21 mg/dL (H)). Liver Function Tests: Recent Labs  Lab 02/01/18 2316 02/02/18 0328 02/03/18 0633  AST 21 24 172*  ALT 6* 10* 103*  ALKPHOS 37* 44 47  BILITOT 0.7 0.7 1.6*  PROT 4.5* 5.1* 5.4*  ALBUMIN 2.5* 3.0* 3.0*   No results for input(s): LIPASE, AMYLASE in the last 168 hours. No results for input(s): AMMONIA in the last 168 hours. Coagulation Profile: Recent Labs  Lab 02/01/18 2316  INR 0.95   Cardiac Enzymes: Recent Labs  Lab 02/01/18 2259 02/02/18 0015 02/02/18 0328 02/02/18 0956 02/02/18 1805  CKTOTAL 98  --   --   --   --   TROPONINI  --  0.29* 0.92* 0.51* 0.24*   BNP (last 3 results) No results for input(s): PROBNP in the last 8760 hours. HbA1C: No results for input(s): HGBA1C in the last 72 hours. CBG: Recent Labs  Lab 02/02/18 2030 02/03/18 0026 02/03/18 0519 02/03/18 1238 02/03/18 1618  GLUCAP 199* 197* 179* 138* 208*   Lipid Profile: No results for input(s): CHOL, HDL, LDLCALC, TRIG, CHOLHDL, LDLDIRECT in the last 72 hours. Thyroid Function Tests: No results for input(s): TSH, T4TOTAL, FREET4, T3FREE, THYROIDAB in the last 72 hours. Anemia Panel: Recent Labs    02/02/18 0328  VITAMINB12 1,144*  FOLATE 26.1  FERRITIN 242  TIBC 176*  IRON 14*  RETICCTPCT 1.1   Sepsis Labs: No results for input(s): PROCALCITON, LATICACIDVEN in the last 168 hours.  No results found for this or any previous visit (from the past 240 hour(s)).       Radiology Studies: Ct Head Wo Contrast  Result Date: 02/02/2018 CLINICAL DATA:  Headache after fall.  History of diabetes. EXAM: CT HEAD WITHOUT CONTRAST CT CERVICAL SPINE WITHOUT CONTRAST TECHNIQUE: Multidetector CT imaging of the head and cervical spine was performed following the standard protocol without intravenous contrast. Multiplanar CT image reconstructions of the cervical spine were also generated. COMPARISON:  MRI of the head May 21, 2004 and CT HEAD May 17, 2004 FINDINGS: CT HEAD  FINDINGS BRAIN: No intraparenchymal hemorrhage, mass effect nor midline shift. The ventricles and sulci are normal for age  cavum septum pellucidum et vergae. Patchy supratentorial white matter hypodensities less than expected for patient's age, though non-specific are most compatible with chronic small vessel ischemic disease. No acute large vascular territory infarcts. No abnormal extra-axial fluid collections. Basal cisterns are patent. VASCULAR: Moderate calcific atherosclerosis of the carotid siphons. SKULL: No skull fracture. Moderate RIGHT temporomandibular osteoarthrosis. Stable RIGHT frontal calvarial probable hemangioma. No significant scalp soft tissue swelling. SINUSES/ORBITS: The mastoid air-cells and included paranasal sinuses are well-aerated.The included ocular globes and orbital contents are non-suspicious. Status post bilateral ocular lens implants. OTHER: None. CT CERVICAL SPINE FINDINGS ALIGNMENT: Straightened lordosis.  Vertebral bodies in alignment. SKULL BASE AND VERTEBRAE: Cervical vertebral bodies and posterior elements are intact. Mild C3-4, C5-6 and C6-7 disc height loss with endplate spurring consistent with degenerative discs. No destructive bony lesions. C1-2 articulation maintained, mild arthropathy. SOFT TISSUES AND SPINAL CANAL: Nonacute. Mild calcific atherosclerosis carotid bifurcations. DISC LEVELS: Mild canal stenosis C3-4, C4-5, C5-6. Moderate C3-4, moderate to severe C5-6 neural foraminal narrowing. UPPER CHEST: Lung apices are clear. OTHER: None. IMPRESSION: CT HEAD: 1. Negative noncontrast CT HEAD for age. CT CERVICAL SPINE: 1. No fracture or malalignment. Electronically Signed   By: Awilda Metro M.D.   On: 02/02/2018 01:00   Ct Cervical Spine Wo Contrast  Result Date: 02/02/2018 CLINICAL DATA:  Headache after fall.  History of diabetes. EXAM: CT HEAD WITHOUT CONTRAST CT CERVICAL SPINE WITHOUT CONTRAST TECHNIQUE: Multidetector CT imaging of the head and cervical spine  was performed following the standard protocol without intravenous contrast. Multiplanar CT image reconstructions of the cervical spine were also generated. COMPARISON:  MRI of the head May 21, 2004 and CT HEAD May 17, 2004 FINDINGS: CT HEAD FINDINGS BRAIN: No intraparenchymal hemorrhage, mass effect nor midline shift. The ventricles and sulci are normal for age cavum septum pellucidum et vergae. Patchy supratentorial white matter hypodensities less than expected for patient's age, though non-specific are most compatible with chronic small vessel ischemic disease. No acute large vascular territory infarcts. No abnormal extra-axial fluid collections. Basal cisterns are patent. VASCULAR: Moderate calcific atherosclerosis of the carotid siphons. SKULL: No skull fracture. Moderate RIGHT temporomandibular osteoarthrosis. Stable RIGHT frontal calvarial probable hemangioma. No significant scalp soft tissue swelling. SINUSES/ORBITS: The mastoid air-cells and included paranasal sinuses are well-aerated.The included ocular globes and orbital contents are non-suspicious. Status post bilateral ocular lens implants. OTHER: None. CT CERVICAL SPINE FINDINGS ALIGNMENT: Straightened lordosis.  Vertebral bodies in alignment. SKULL BASE AND VERTEBRAE: Cervical vertebral bodies and posterior elements are intact. Mild C3-4, C5-6 and C6-7 disc height loss with endplate spurring consistent with degenerative discs. No destructive bony lesions. C1-2 articulation maintained, mild arthropathy. SOFT TISSUES AND SPINAL CANAL: Nonacute. Mild calcific atherosclerosis carotid bifurcations. DISC LEVELS: Mild canal stenosis C3-4, C4-5, C5-6. Moderate C3-4, moderate to severe C5-6 neural foraminal narrowing. UPPER CHEST: Lung apices are clear. OTHER: None. IMPRESSION: CT HEAD: 1. Negative noncontrast CT HEAD for age. CT CERVICAL SPINE: 1. No fracture or malalignment. Electronically Signed   By: Awilda Metro M.D.   On: 02/02/2018 01:00   Dg  Chest Portable 1 View  Result Date: 02/01/2018 CLINICAL DATA:  Fall, deformity. EXAM: PORTABLE CHEST 1 VIEW COMPARISON:  Chest radiograph June 25, 2005 FINDINGS: Cardiomediastinal silhouette is normal. Coronary artery calcifications versus stents. Calcified aortic arch. No pleural effusions or focal consolidations. Trachea projects midline and there is no pneumothorax. Soft tissue planes and included osseous structures are non-suspicious. Osteopenia. IMPRESSION: No acute cardiopulmonary process. Aortic Atherosclerosis (ICD10-I70.0). Electronically Signed  By: Awilda Metro M.D.   On: 02/01/2018 22:48   Dg C-arm 1-60 Min  Result Date: 02/02/2018 CLINICAL DATA:  Intramedullary rod fixation of left femur. EXAM: DG C-ARM 61-120 MIN; LEFT FEMUR 2 VIEWS Radiation exposure index: 20.44 mGy. COMPARISON:  Radiographs of February 01, 2018. FINDINGS: Twelve intraoperative fluoroscopic images were obtained of the left femur. These demonstrate intramedullary rod fixation of comminuted proximal left femoral shaft fracture. Good alignment of fracture components is noted. IMPRESSION: Status post intramedullary rod fixation of comminuted proximal left femoral shaft fracture. Electronically Signed   By: Lupita Raider, M.D.   On: 02/02/2018 17:53   Dg C-arm 1-60 Min  Result Date: 02/02/2018 CLINICAL DATA:  Intramedullary rod fixation of left femur. EXAM: DG C-ARM 61-120 MIN; LEFT FEMUR 2 VIEWS Radiation exposure index: 20.44 mGy. COMPARISON:  Radiographs of February 01, 2018. FINDINGS: Twelve intraoperative fluoroscopic images were obtained of the left femur. These demonstrate intramedullary rod fixation of comminuted proximal left femoral shaft fracture. Good alignment of fracture components is noted. IMPRESSION: Status post intramedullary rod fixation of comminuted proximal left femoral shaft fracture. Electronically Signed   By: Lupita Raider, M.D.   On: 02/02/2018 17:53   Dg Femur Min 2 Views Left  Result  Date: 02/02/2018 CLINICAL DATA:  Intramedullary rod fixation of left femur. EXAM: DG C-ARM 61-120 MIN; LEFT FEMUR 2 VIEWS Radiation exposure index: 20.44 mGy. COMPARISON:  Radiographs of February 01, 2018. FINDINGS: Twelve intraoperative fluoroscopic images were obtained of the left femur. These demonstrate intramedullary rod fixation of comminuted proximal left femoral shaft fracture. Good alignment of fracture components is noted. IMPRESSION: Status post intramedullary rod fixation of comminuted proximal left femoral shaft fracture. Electronically Signed   By: Lupita Raider, M.D.   On: 02/02/2018 17:53   Dg Femur Port Min 2 Views Left  Result Date: 02/02/2018 CLINICAL DATA:  Postop EXAM: LEFT FEMUR PORTABLE 2 VIEWS COMPARISON:  02/01/2018 FINDINGS: Interval intramedullary rod and screw fixation of the left femur for comminuted fracture involving the proximal shaft of the femur. Decreased angulation and displacement at the fracture site. Small soft tissue gas consistent with recent operative status. IMPRESSION: Interval intramedullary rod and screw fixation of left femur for comminuted fracture of the proximal femoral shaft with decreased displacement and angulation compared to prior. Expected postsurgical changes. Electronically Signed   By: Jasmine Pang M.D.   On: 02/02/2018 19:11   Dg Femur Portable Min 2 Views Left  Result Date: 02/01/2018 CLINICAL DATA:  72 y/o  F; fall. EXAM: LEFT FEMUR PORTABLE 2 VIEWS COMPARISON:  None. FINDINGS: Three part comminuted acute fracture of the proximal left femur diaphysis with 1 shaft's with anterior and lateral displacement of the major proximal shaft component from distal. Mild osteoarthrosis of the left hip joint with periarticular osteophytosis. Left knee joint is well maintained. Bones are demineralized. Vascular calcifications. IMPRESSION: Comminuted displaced acute fracture of proximal left femur. Electronically Signed   By: Mitzi Hansen M.D.   On:  02/01/2018 22:49        Scheduled Meds: . hydrocortisone  30 mg Oral Q breakfast  . insulin aspart  0-9 Units Subcutaneous Q4H  . levothyroxine  75 mcg Oral QAC breakfast  . multivitamin with minerals  1 tablet Oral Daily  . potassium chloride  40 mEq Oral Once  . prednisoLONE acetate  1 drop Both Eyes BID  . sodium chloride  1 drop Both Eyes BID   Continuous Infusions: .  ceFAZolin (ANCEF) IV  Stopped (02/03/18 1525)  . lactated ringers 10 mL/hr at 02/02/18 1512     LOS: 1 day    Time spent: over 30 min    Lacretia Nicks, MD [facility] Pager [phone]  If 7PM-7AM, please contact night-coverage [email or web address] Password [facility] 02/03/2018, 4:35 PM

## 2018-02-03 NOTE — Evaluation (Signed)
Physical Therapy Evaluation Patient Details Name: [name] MRN: [id number] DOB: [birth year] Today's Date: 02/03/2018   History of Present Illness  [name] is a 72yo black female who sustained a fall at home 4/24, came [facility] on 4/25 and underwent IM nail left femur, now WBAT. At baseline pt AMB limited community distances daily to the mailbox without AD as well as 13 stairs up/down to access her 2nd floor apartment. No prior falls history. Pt is legally deaf and legally blind although she can see well enough at baseline navigate a meal and feed self, shop at grocery store with her sons, AMB her home independently. Her son Terick provdes history.   Clinical Impression  Pt admitted with above diagnosis. Pt currently with functional limitations due to the deficits listed below (see "PT Problem List"). Upon entry, the patient is received semirecumbent in bed, son Lodema Hongerick is present who assists with patient communication and gives history. The pt is awake, calm, and motivated to participate, askign regularly if it's time to sit at EOB or time to stand. Pain appears well managed, pt giving helpful feedback during session. Commands are not given to the patient due to deafness and blindness, but pt's questions are answered by gentle physical nodding or shaking of her head as she does with family at baseline.Functional mobility assessment demonstrates mild-moderate strength impairment in trunk and BLE, the pt now requiring Mod-assist physical assistance for bed mobility, and minA for transfers, and gait, whereas the patient performed these at a higher level of independence PTA.  Overall the patient seems pleased at end of session, crying tears of joy as she describes them and thanking Jesus. Pt will benefit from skilled PT intervention to increase independence and safety with basic mobility in preparation for discharge to the venue listed below.       Follow Up Recommendations SNF;Supervision for  mobility/OOB;Supervision/Assistance - 24 hour    Equipment Recommendations  Rolling walker with 5" wheels    Recommendations for Other Services       Precautions / Restrictions Precautions Precautions: Fall Restrictions Weight Bearing Restrictions: Yes LLE Weight Bearing: Weight bearing as tolerated      Mobility  Bed Mobility Overal bed mobility: Needs Assistance Bed Mobility: Supine to Sit     Supine to sit: Mod assist     General bed mobility comments: trunk strength weakness and difficulty moving leg  Transfers Overall transfer level: Needs assistance Equipment used: Rolling walker (2 wheeled) Transfers: Sit to/from Stand Sit to Stand: Min assist;Min guard         General transfer comment: limited weight bearing inititally, but improving with time up  Ambulation/Gait Ambulation/Gait assistance: Min guard;Min assist Ambulation Distance (Feet): 15 Feet Assistive device: Rolling walker (2 wheeled)       General Gait Details: steppage gait for antalgia; PT forcefully guides RW for turns to communicate directional changes.   Stairs            Wheelchair Mobility    Modified Rankin (Stroke Patients Only)       Balance Overall balance assessment: Modified Independent;Mild deficits observed, not formally tested                                           Pertinent Vitals/Pain Pain Assessment: Faces Faces Pain Scale: Hurts a little bit Pain Location: operative site Pain Intervention(s): Limited activity within patient's tolerance;Monitored  during session    Home Living Family/patient expects to be discharged to:: Skilled nursing facility Living Arrangements: Alone Available Help at Discharge: Family(3 sons between [place] and [employer] ) Type of Home: Apartment Home Access: Stairs to enter Entrance Stairs-Rails: Lawyer of Steps: 13 Home Layout: One level Home Equipment: None      Prior Function  Level of Independence: Independent         Comments: Independent in AMB in/out of home, as well as ADL, cooking. Pt needs assistance with transportation.      Hand Dominance        Extremity/Trunk Assessment                Communication   Communication: Deaf;Other (comment)(Deaf since about 2003 d/t occupation realted hearing loss, also legally blind. Does not speak ASL. )  Cognition Arousal/Alertness: Awake/alert Behavior During Therapy: WFL for tasks assessed/performed Overall Cognitive Status: Within Functional Limits for tasks assessed                                        General Comments      Exercises General Exercises - Lower Extremity Short Arc Quad: PROM;Left;10 reps;Limitations;Supine Short Arc Quad Limitations: unable to commincate to patient cues for A/ROM Heel Slides: AAROM;Left;15 reps;Limitations;Supine Heel Slides Limitations: verbally reporting improved toleratce with repitition, crying tear of gratitude for improvement of pain compared to pre-op Hip ABduction/ADduction: AAROM;PROM;Left;15 reps;Supine;Limitations Hip Abduction/Adduction Limitations: verbally reporting improved toleratce with repitition,   Assessment/Plan    PT Assessment Patient needs continued PT services  PT Problem List Decreased strength;Decreased range of motion;Decreased activity tolerance;Decreased balance;Decreased mobility;Decreased knowledge of precautions       PT Treatment Interventions Gait training;Functional mobility training;Therapeutic activities;Therapeutic exercise;Patient/family education    PT Goals (Current goals can be found in the Care Plan section)  Acute Rehab PT Goals PT Goal Formulation: Patient unable to participate in goal setting    Frequency Min 3X/week   Barriers to discharge Inaccessible home environment;Decreased caregiver support      Co-evaluation               AM-PAC PT "6 Clicks" Daily Activity  Outcome  Measure Difficulty turning over in bed (including adjusting bedclothes, sheets and blankets)?: Unable Difficulty moving from lying on back to sitting on the side of the bed? : Unable Difficulty sitting down on and standing up from a chair with arms (e.g., wheelchair, bedside commode, etc,.)?: A Lot Help needed moving to and from a bed to chair (including a wheelchair)?: A Lot Help needed walking in hospital room?: A Lot Help needed climbing 3-5 steps with a railing? : Total 6 Click Score: 9    End of Session Equipment Utilized During Treatment: Gait belt Activity Tolerance: Patient tolerated treatment well Patient left: in chair;with call bell/phone within reach;with chair alarm set;with family/visitor present;with SCD's reapplied Nurse Communication: Mobility status PT Visit Diagnosis: Muscle weakness (generalized) (M62.81);Other abnormalities of gait and mobility (R26.89);Difficulty in walking, not elsewhere classified (R26.2)    Time: 1610-9604 PT Time Calculation (min) (ACUTE ONLY): 42 min   Charges:   PT Evaluation $PT Eval High Complexity: 1 High PT Treatments $Therapeutic Exercise: 8-22 mins $Therapeutic Activity: 8-22 mins   PT G Codes:        4:06 PM, Feb 04, 2018 Rosamaria Lints, PT, DPT Physical Therapist - [place] [phone] (Pager)  [phone] (Office)  Shonica Weier C 02/03/2018, 4:02 PM

## 2018-02-03 NOTE — Evaluation (Signed)
Clinical/Bedside Swallow Evaluation Patient Details  Name: [name] MRN: [id number] Date of Birth: [birth year]  Today's Date: 02/03/2018 Time: SLP Start Time (ACUTE ONLY): 1430 SLP Stop Time (ACUTE ONLY): 1448 SLP Time Calculation (min) (ACUTE ONLY): 18 min  Past Medical History:  Past Medical History:  Diagnosis Date  . Hypertension   . Type 2 diabetes mellitus (HCC)    Past Surgical History:  Past Surgical History:  Procedure Laterality Date  . EYE SURGERY    . FEMUR IM NAIL Left 02/02/2018   HPI:  [name] is an 72 y.o. female with hypothyroidism, adrenal insufficiency, and DM.Marland Kitchen. Femur fx, ORIF 4/25   Assessment / Plan / Recommendation Clinical Impression  Pt demonstrates nomral swallow function. DOes not want solids due to gum soreness. Will downgrade to dys 2/thin. no SLP f/u needed.       Aspiration Risk       Diet Recommendation Dysphagia 2 (Fine chop);Thin liquid        Other  Recommendations     Follow up Recommendations        Frequency and Duration            Prognosis        Swallow Study   General HPI: [name] is an 72 y.o. female with hypothyroidism, adrenal insufficiency, and DM.Marland Kitchen. Femur fx, ORIF 4/25 Type of Study: Bedside Swallow Evaluation Previous Swallow Assessment: none Diet Prior to this Study: Regular;Thin liquids Vision: Impaired for self-feeding Self-Feeding Abilities: Needs assist Patient Positioning: Upright in bed Baseline Vocal Quality: Normal Volitional Cough: Strong Volitional Swallow: Able to elicit    Oral/Motor/Sensory Function Overall Oral Motor/Sensory Function: Within functional limits   Ice Chips     Thin Liquid Thin Liquid: Within functional limits    Nectar Thick Nectar Thick Liquid: Not tested   Honey Thick Honey Thick Liquid: Not tested   Puree Puree: Within functional limits   Solid   GO   Solid: Not tested        Shanya Ferriss, Riley NearingBonnie Caroline 02/03/2018,2:54 PM

## 2018-02-04 LAB — COMPREHENSIVE METABOLIC PANEL
ALT: 66 U/L — ABNORMAL HIGH (ref 14–54)
ANION GAP: 8 (ref 5–15)
AST: 80 U/L — ABNORMAL HIGH (ref 15–41)
Albumin: 2.9 g/dL — ABNORMAL LOW (ref 3.5–5.0)
Alkaline Phosphatase: 52 U/L (ref 38–126)
BILIRUBIN TOTAL: 0.9 mg/dL (ref 0.3–1.2)
BUN: 14 mg/dL (ref 6–20)
CHLORIDE: 107 mmol/L (ref 101–111)
CO2: 25 mmol/L (ref 22–32)
Calcium: 8.8 mg/dL — ABNORMAL LOW (ref 8.9–10.3)
Creatinine, Ser: 1.13 mg/dL — ABNORMAL HIGH (ref 0.44–1.00)
GFR, EST AFRICAN AMERICAN: 54 mL/min — AB (ref >60)
GFR, EST NON AFRICAN AMERICAN: 47 mL/min — AB (ref >60)
Glucose, Bld: 138 mg/dL — ABNORMAL HIGH (ref 65–99)
POTASSIUM: 4.1 mmol/L (ref 3.5–5.1)
Sodium: 140 mmol/L (ref 135–145)
TOTAL PROTEIN: 5.6 g/dL — AB (ref 6.5–8.1)

## 2018-02-04 LAB — BPAM RBC
BLOOD PRODUCT EXPIRATION DATE: 201905222359
BLOOD PRODUCT EXPIRATION DATE: 201905222359
Blood Product Expiration Date: 201905172359
Blood Product Expiration Date: 201905222359
Blood Product Expiration Date: 201905222359
Blood Product Expiration Date: 201905222359
ISSUE DATE / TIME: 201904251726
ISSUE DATE / TIME: 201904251726
ISSUE DATE / TIME: 201904251726
ISSUE DATE / TIME: 201904251726
UNIT TYPE AND RH: 5100
UNIT TYPE AND RH: 5100
UNIT TYPE AND RH: 5100
Unit Type and Rh: 5100
Unit Type and Rh: 5100
Unit Type and Rh: 5100

## 2018-02-04 LAB — GLUCOSE, CAPILLARY
GLUCOSE-CAPILLARY: 132 mg/dL — AB (ref 65–99)
GLUCOSE-CAPILLARY: 159 mg/dL — AB (ref 65–99)
GLUCOSE-CAPILLARY: 189 mg/dL — AB (ref 65–99)
Glucose-Capillary: 196 mg/dL — ABNORMAL HIGH (ref 65–99)

## 2018-02-04 LAB — URINALYSIS, ROUTINE W REFLEX MICROSCOPIC
Bilirubin Urine: NEGATIVE
Glucose, UA: NEGATIVE mg/dL
Ketones, ur: NEGATIVE mg/dL
NITRITE: NEGATIVE
PH: 5 (ref 5.0–8.0)
Protein, ur: 30 mg/dL — AB
SPECIFIC GRAVITY, URINE: 1.013 (ref 1.005–1.030)

## 2018-02-04 LAB — TYPE AND SCREEN
ABO/RH(D): O POS
Antibody Screen: NEGATIVE
UNIT DIVISION: 0
UNIT DIVISION: 0
UNIT DIVISION: 0
Unit division: 0
Unit division: 0
Unit division: 0

## 2018-02-04 LAB — HEPATITIS PANEL, ACUTE
HCV Ab: <0.1 s/co ratio (ref 0.0–0.9)
HEP B C IGM: NEGATIVE
Hep A IgM: NEGATIVE
Hepatitis B Surface Ag: NEGATIVE

## 2018-02-04 LAB — CBC
HCT: 39.6 % (ref 36.0–46.0)
Hemoglobin: 13.5 g/dL (ref 12.0–15.0)
MCH: 27.8 pg (ref 26.0–34.0)
MCHC: 34.1 g/dL (ref 30.0–36.0)
MCV: 81.6 fL (ref 78.0–100.0)
PLATELETS: 105 10*3/uL — AB (ref 150–400)
RBC: 4.85 MIL/uL (ref 3.87–5.11)
RDW: 15.5 % (ref 11.5–15.5)
WBC: 12.2 10*3/uL — AB (ref 4.0–10.5)

## 2018-02-04 LAB — MAGNESIUM: MAGNESIUM: 2.3 mg/dL (ref 1.7–2.4)

## 2018-02-04 MED ORDER — TRAMADOL HCL 50 MG PO TABS: 50.0000 mg | ORAL_TABLET | Freq: Four times a day (QID) | ORAL | 0 refills | Status: DC | PRN

## 2018-02-04 MED ORDER — ENOXAPARIN SODIUM 40 MG/0.4ML ~~LOC~~ SOLN
40.0000 mg | SUBCUTANEOUS | Status: DC
  Administered 2018-02-05 – 2018-02-06 (×2): 40 mg via SUBCUTANEOUS
  Filled 2018-02-04 (×2): qty 0.4

## 2018-02-04 MED ORDER — INSULIN ASPART 100 UNIT/ML ~~LOC~~ SOLN
0.0000 [IU] | Freq: Three times a day (TID) | SUBCUTANEOUS | Status: DC
  Administered 2018-02-04 (×2): 1 [IU] via SUBCUTANEOUS
  Administered 2018-02-04: 2 [IU] via SUBCUTANEOUS

## 2018-02-04 MED ORDER — AMLODIPINE BESYLATE 5 MG PO TABS
5.0000 mg | ORAL_TABLET | Freq: Every day | ORAL | Status: DC
  Administered 2018-02-04 – 2018-02-06 (×3): 5 mg via ORAL
  Filled 2018-02-04 (×3): qty 1

## 2018-02-04 MED ORDER — ENOXAPARIN SODIUM 40 MG/0.4ML ~~LOC~~ SOLN: 40.0000 mg | SUBCUTANEOUS | Status: DC

## 2018-02-04 MED ORDER — ENOXAPARIN SODIUM 40 MG/0.4ML ~~LOC~~ SOLN: 40.0000 mg | SUBCUTANEOUS | 0 refills | Status: DC

## 2018-02-04 NOTE — Clinical Social Work Note (Signed)
Clinical Social Work Assessment  Patient Details  Name: [name] MRN: [id number] Date of Birth: [birth year]  Date of referral:  02/04/18               Reason for consult:  Facility Placement                Permission sought to share information with:    Permission granted to share information::     Name::     [name]  Agency::     Relationship::  Son  Contact Information:  [phone]  Housing/Transportation Living arrangements for the past 2 months:  Single Family Home Source of Information:  Adult Children Patient Interpreter Needed:  None Criminal Activity/Legal Involvement Pertinent to Current Situation/Hospitalization:  No - Comment as needed Significant Relationships:  Adult Children Lives with:  Self Do you feel safe going back to the place where you live?  No Need for family participation in patient care:  No (Coment)  Care giving concerns:  Pt is only alert to self. CSW spoke with pt's son [name] at bedside.  Social Worker assessment / plan:  CSW spoke with pt's son at bedside. Pt's son is agreeable for pt to d/c to SNF at d/c. Pt lives alone. Pt's son states him and his brother would like to look over SNF list and determine. Pt's son to follow up with CSW.  Employment status:  Retired Health and safety inspector:  Medicare PT Recommendations:  Skilled Nursing Facility Information / Referral to community resources:  Skilled Nursing Facility  Patient/Family's Response to care:  Pt verbalized understanding of CSW role and expressed appreciation for support. Pt denies any concern regarding pt care at this time.   Patient/Family's Understanding of and Emotional Response to Diagnosis, Current Treatment, and Prognosis:  Pt's son understanding and realistic regarding pt's physical limitations. Pt's son understands the need for pt to go to SNF at d/c. CSW will continue to provide support and facilitate d/c needs.   Emotional Assessment Appearance:  Appears stated  age Attitude/Demeanor/Rapport:  Unable to Assess Affect (typically observed):  Unable to Assess Orientation:  Oriented to Self Alcohol / Substance use:  Not Applicable Psych involvement (Current and /or in the community):  No (Comment)  Discharge Needs  Concerns to be addressed:  Basic Needs Readmission within the last 30 days:  No Current discharge risk:  Dependent with Mobility Barriers to Discharge:  Continued Medical Work up   [employer], LCSW 02/04/2018, 1:27 PM

## 2018-02-04 NOTE — Progress Notes (Signed)
Orthopaedic Trauma Progress Note  S: Doing well.  According to sons that are at bedside she has been ambulating with the therapy.  Pain is been well controlled.  O:  Vitals:   02/04/18 0627 02/04/18 1208  BP: (!) 197/74 (!) 176/77  Pulse: 89 72  Resp:  12  Temp: (!) 97.5 F (36.4 C) 98.1 F (36.7 C)  SpO2: 99% 97%   Left lower extremity: Incisions are clean dry and intact.  Compartments are soft compressible.  No pain with gentle range of motion of the hip or knee.  Warm and well-perfused foot.  Did not cooperate with motor and sensory exam  Imaging: Stable postop imaging  Labs:  CBC    Component Value Date/Time   WBC 12.2 (H) 02/04/2018 0643   RBC 4.85 02/04/2018 0643   HGB 13.5 02/04/2018 0643   HCT 39.6 02/04/2018 0643   PLT 105 (L) 02/04/2018 0643   MCV 81.6 02/04/2018 0643   MCH 27.8 02/04/2018 0643   MCHC 34.1 02/04/2018 0643   RDW 15.5 02/04/2018 06427    A/P: 72 year old female status post intramedullary nailing of left femur  Weightbearing: WBAT LLE Insicional and dressing care: Okay to leave open to air Orthopedic device(s): None Showering: Okay to shower from orthopedics perspective VTE prophylaxis: Lovenox  qd 30 days Pain control: Tramadol Follow - up plan: 2 to 3 weeks with x-rays and suture removal  Roby Lofts, MD [facility] [phone] (phone)

## 2018-02-04 NOTE — Plan of Care (Signed)

## 2018-02-04 NOTE — Evaluation (Signed)
Occupational Therapy Evaluation Patient Details Name: [name] MRN: [id number] DOB: 1946-06-03 Today's Date: 02/04/2018    History of Present Illness [name] is a 72yo black female who sustained a fall at home 4/24, came to [facility] on 4/25 and underwent IM nail left femur, now WBAT. At baseline pt ambulating limited community distances daily to the mailbox without AD as well as 13 stairs up/down to access her 2nd floor apartment. No prior falls history. Pt is legally deaf and legally blind although she can see well enough at baseline navigate a meal and feed self, shop at grocery store with her sons, AMB her home independently. Her son provdes history.    Clinical Impression   PTA, pt was living alone and independent with basic ADL and functional mobility. Her family assists with transportation. She has limited hearing and visual skills and family communicates with her by use of white board, simple gestures, or assisting her gently to nod "yes" or "no" when she asks a question. Pt does not utilize ASL for communication. Pt currently motivated to participate with therapy session and son assisted in communication throughout. She was able to complete bed mobility with mod assist to sit at EOB and simulated toilet transfer with mod assist as well. Once provided with spoon, pt was able to feed herself sitting upright in chair and communicate verbally that she needed assistance to sit more upright to access her food. Pt would benefit from continued OT services while admitted to maximize independence and functional participation in ADL. Recommend follow-up therapies at SNF level to maximize return to independence.     Follow Up Recommendations  SNF;Supervision/Assistance - 24 hour    Equipment Recommendations  Other (comment)(TBD at next venue of care)    Recommendations for Other Services       Precautions / Restrictions Precautions Precautions: Fall Restrictions Weight Bearing  Restrictions: Yes LLE Weight Bearing: Weight bearing as tolerated      Mobility Bed Mobility Overal bed mobility: Needs Assistance Bed Mobility: Rolling;Sidelying to Sit Rolling: Min guard(Pt initiated roll when OT slightly moved R LE to cue) Sidelying to sit: Mod assist       General bed mobility comments: Assist to progress LLE. Pt stating "it just won't go"  Transfers Overall transfer level: Needs assistance Equipment used: Rolling walker (2 wheeled) Transfers: Sit to/from [employer] Sit to Stand: Min assist Stand pivot transfers: Mod assist       General transfer comment: Min assist to power up to full standing. Mod assist to guide to recliner with OT assisting pt to feel recliner chair arm rests with her hands.     Balance Overall balance assessment: Needs assistance Sitting-balance support: No upper extremity supported;Feet supported Sitting balance-Leahy Scale: Fair     Standing balance support: Bilateral upper extremity supported;During functional activity Standing balance-Leahy Scale: Poor Standing balance comment: Relies on BUE support.                            ADL either performed or assessed with clinical judgement   ADL Overall ADL's : Needs assistance/impaired Eating/Feeding: Set up;Sitting Eating/Feeding Details (indicate cue type and reason): Able to see with increased lighting to self-feed when seated in chair. OT handed pt the spoon.  Grooming: Supervision/safety;Sitting;Set up   Upper Body Bathing: Supervision/ safety;Sitting   Lower Body Bathing: Maximal assistance;Sit to/from stand   Upper Body Dressing : Supervision/safety;Sitting   Lower Body Dressing: Maximal assistance;Sit  to/from Database administrator: Moderate assistance;Stand-pivot;RW Toilet Transfer Details (indicate cue type and reason): Taking a few pivotal steps. OT placing pt's hand on recliner so that she could feel where she was going. She was able  to state "to the chair?" in response. She responded well to simple gesture cues directly within line of sight.  Toileting- Clothing Manipulation and Hygiene: Maximal assistance;Sit to/from stand       Functional mobility during ADLs: Moderate assistance;Rolling walker(stand-pivot only this session) General ADL Comments: Pt benefitting from simple gestural cues for communication this session. She is able to ask questions and verbalize her needs and utilized family's method of communication involving pt asking question and therapist or son gently nodding pt's head yes or no for the answer.      Vision Baseline Vision/History: Legally blind Patient Visual Report: (has been unable to use eye drops which impacts vision) Additional Comments: Per pt's son, since fall, pt has been unable to use eye drops which has decreased her functional vision. Pt is able to follow simple gestural commands and read words from white board. She was able to navigate tray to locate food items when handed her spoon.      Perception     Praxis      Pertinent Vitals/Pain Pain Assessment: Faces Faces Pain Scale: Hurts little more Pain Location: operative site; grimacing with movement Pain Descriptors / Indicators: Grimacing Pain Intervention(s): Limited activity within patient's tolerance;Monitored during session;Repositioned     Hand Dominance     Extremity/Trunk Assessment Upper Extremity Assessment Upper Extremity Assessment: Overall WFL for tasks assessed   Lower Extremity Assessment Lower Extremity Assessment: LLE deficits/detail LLE Deficits / Details: Pain with movement; "you will have to go slow with that one" during bed mobility LLE: Unable to fully assess due to pain       Communication Communication Communication: (Legally deaf, legally blind; does not use ASL for language)   Cognition Arousal/Alertness: Awake/alert Behavior During Therapy: WFL for tasks assessed/performed Overall Cognitive  Status: Difficult to assess                                 General Comments: Pt uses either writing or simple gestures for communication. When she asks a question family will gently assist her to nod or shake her head "no" to answer the question. She is able to read large print on white board. Able to follow simple gesture commands and problem solve when using method of communication utilized at home.    General Comments  Son present and assisting with communication.     Exercises     Shoulder Instructions      Home Living Family/patient expects to be discharged to:: Skilled nursing facility Living Arrangements: Alone Available Help at Discharge: Family(3 sons nearby [facility] and [employer])) Type of Home: Apartment Home Access: Stairs to enter [employer] of Steps: 13 Entrance Stairs-Rails: Left;Right Home Layout: One level               Home Equipment: None          Prior Functioning/Environment Level of Independence: Independent        Comments: Independent for basic ADL and cooking. Assistance from family for home management and transportation.         OT Problem List: Decreased strength;Decreased range of motion;Decreased activity tolerance;Impaired balance (sitting and/or standing);Decreased safety awareness;Decreased knowledge of use of DME or AE;Impaired  vision/perception;Decreased knowledge of precautions;Pain      OT Treatment/Interventions: Self-care/ADL training;Energy conservation;Therapeutic exercise;Therapeutic activities;Visual/perceptual remediation/compensation;Patient/family education;Balance training    OT Goals(Current goals can be found in the care plan section) Acute Rehab OT Goals Patient Stated Goal: to eat dinner OT Goal Formulation: With patient/family Time For Goal Achievement: 02/18/18 Potential to Achieve Goals: Good ADL Goals Pt Will Perform Grooming: with modified independence;sitting Pt Will Perform  Lower Body Dressing: with min assist;sit to/from stand Pt Will Transfer to Toilet: with min assist;ambulating;bedside commode Pt Will Perform Toileting - Clothing Manipulation and hygiene: with min assist;sit to/from stand Additional ADL Goal #1: Pt will utilize soft touch call button to notify staff of need on all opportunities independently.  OT Frequency: Min 1X/week   Barriers to D/C:            Co-evaluation              AM-PAC PT "6 Clicks" Daily Activity     Outcome Measure Help from another person eating meals?: A Little Help from another person taking care of personal grooming?: A Little Help from another person toileting, which includes using toliet, bedpan, or urinal?: A Lot Help from another person bathing (including washing, rinsing, drying)?: A Lot Help from another person to put on and taking off regular upper body clothing?: A Little Help from another person to put on and taking off regular lower body clothing?: A Lot 6 Click Score: 15   End of Session Equipment Utilized During Treatment: Rolling walker Nurse Communication: Mobility status  Activity Tolerance: Patient tolerated treatment well Patient left: in chair;with call bell/phone within reach;with chair alarm set  OT Visit Diagnosis: Other abnormalities of gait and mobility (R26.89);Pain Pain - Right/Left: Left Pain - part of body: Leg                Time: 1610-9604 OT Time Calculation (min): 20 min Charges:  OT General Charges $OT Visit: 1 Visit OT Evaluation $OT Eval Moderate Complexity: 1 Mod G-Codes:     Doristine Section, MS OTR/L  Pager: [phone]   Laneice Meneely A Otoniel Myhand 02/04/2018, 6:31 PM

## 2018-02-04 NOTE — Plan of Care (Signed)
  Problem: Clinical Measurements: Goal: Ability to maintain clinical measurements within normal limits will improve Outcome: Progressing   Problem: Pain Managment: Goal: General experience of comfort will improve Outcome: Progressing   

## 2018-02-04 NOTE — [place] FL2 (Signed)
MEDICAID FL2 LEVEL OF CARE SCREENING TOOL     IDENTIFICATION  Patient Name: [name] Birthdate: 1945/10/21 Sex: female Admission Date (Current Location): 02/01/2018  [facility] and [place] Number:  Producer, television/film/video and Address:  [facility], [address] [place], [place] [address]      Provider Number: [id number]  Attending Physician Name and Address:  Zigmund Daniel., *  Relative Name and Phone Number:       Current Level of Care: Hospital Recommended Level of Care: Skilled Nursing Facility Prior Approval Number:    Date Approved/Denied:   PASRR Number: [id number]  Discharge Plan: SNF    Current Diagnoses: Patient Active Problem List   Diagnosis Date Noted  . Anemia 02/02/2018  . Thrombocytopenia (HCC) 02/02/2018  . Hypokalemia 02/02/2018  . Hypomagnesemia 02/02/2018  . Femur fracture, left (HCC) 02/02/2018  . Adrenal insufficiency (HCC) 02/02/2018  . Hypothyroidism 02/02/2018  . Type II diabetes mellitus (HCC) 02/02/2018  . Elevated troponin 02/02/2018    Orientation RESPIRATION BLADDER Height & Weight     Self  Normal Continent Weight: 149 lb 8 oz (67.8 kg) Height:   (160 cm)  BEHAVIORAL SYMPTOMS/MOOD NEUROLOGICAL BOWEL NUTRITION STATUS      Continent Diet(DYS 2 diet, thin liquids)  AMBULATORY STATUS COMMUNICATION OF NEEDS Skin     Verbally Surgical wounds(Closed incision left thigh, Hydrocolloid dressing)                       Personal Care Assistance Level of Assistance  Bathing, Feeding, Dressing Bathing Assistance: Limited assistance Feeding assistance: Independent Dressing Assistance: Limited assistance     Functional Limitations Info  Sight, Hearing, Speech Sight Info: Adequate Hearing Info: Adequate Speech Info: Adequate    SPECIAL CARE FACTORS FREQUENCY  PT (By licensed PT), OT (By licensed OT)     PT Frequency: 3x OT Frequency: 3x            Contractures  Contractures Info: Not present    Additional Factors Info  Code Status, Allergies Code Status Info: Full Code Allergies Info: No known allergies.           Current Medications (02/04/2018):  This is the current hospital active medication list Current Facility-Administered Medications  Medication Dose Route Frequency Provider Last Rate Last Dose  . amLODipine (NORVASC) tablet 5 mg  5 mg Oral Daily Zigmund Daniel., MD   5 mg at 02/04/18 1255  . aspirin chewable tablet 81 mg  81 mg Oral Daily Zigmund Daniel., MD   81 mg at 02/04/18 0843  . atorvastatin (LIPITOR) tablet 80 mg  80 mg Oral q1800 Zigmund Daniel., MD      . bisacodyl (DULCOLAX) EC tablet 5 mg  5 mg Oral Daily PRN Haddix, Gillie Manners, MD      . heparin injection 5,000 Units  5,000 Units Subcutaneous Q8H Zigmund Daniel., MD   5,000 Units at 02/04/18 1256  . HYDROcodone-acetaminophen (NORCO/VICODIN) 5-325 MG per tablet 1-2 tablet  1-2 tablet Oral Q6H PRN Haddix, Gillie Manners, MD   1 tablet at 02/03/18 0951  . hydrocortisone (CORTEF) tablet 30 mg  30 mg Oral Q breakfast Haddix, Gillie Manners, MD   30 mg at 02/04/18 0842  . insulin aspart (novoLOG) injection 0-9 Units  0-9 Units Subcutaneous TID WC Bodenheimer, Charles A, NP   1 Units at 02/04/18 0844  . labetalol (NORMODYNE,TRANDATE) injection 5-10  mg  5-10 mg Intravenous Q2H PRN Haddix, Gillie Manners, MD   5 mg at 02/04/18 1302  . lactated ringers infusion   Intravenous Continuous Zigmund Daniel., MD 10 mL/hr at 02/02/18 1512    . levothyroxine (SYNTHROID, LEVOTHROID) tablet 75 mcg  75 mcg Oral QAC breakfast Haddix, Gillie Manners, MD   75 mcg at 02/04/18 0843  . metoprolol tartrate (LOPRESSOR) tablet 12.5 mg  12.5 mg Oral BID Zigmund Daniel., MD   12.5 mg at 02/04/18 0842  . morphine 4 MG/ML injection 0.52-1 mg  0.52-1 mg Intravenous Q2H PRN Haddix, Gillie Manners, MD   1 mg at 02/04/18 0040  . multivitamin with minerals tablet 1 tablet  1 tablet Oral Daily Haddix, Gillie Manners, MD    1 tablet at 02/04/18 0843  . potassium chloride SA (K-DUR,KLOR-CON) CR tablet 40 mEq  40 mEq Oral Once Haddix, Gillie Manners, MD      . prednisoLONE acetate (PRED FORTE) 1 % ophthalmic suspension 1 drop  1 drop Both Eyes BID Haddix, Gillie Manners, MD   1 drop at 02/04/18 0845  . senna-docusate (Senokot-S) tablet 1 tablet  1 tablet Oral QHS PRN Haddix, Gillie Manners, MD      . sodium chloride (MURO 128) 2 % ophthalmic solution 1 drop  1 drop Both Eyes BID Haddix, Gillie Manners, MD   1 drop at 02/04/18 0845  . traMADol (ULTRAM) tablet 50 mg  50 mg Oral Q6H PRN Haddix, Gillie Manners, MD         Discharge Medications: Please see discharge summary for a list of discharge medications.  Relevant Imaging Results:  Relevant Lab Results:   Additional Information SSN: [id number]  Maree Krabbe, LCSW

## 2018-02-04 NOTE — Progress Notes (Signed)
PROGRESS NOTE    [name]  ZOX:[id number] DOB: [birth year] DOA: 02/01/2018 PCP: Billee Cashing, MD   Brief Narrative:   [name] is a 72 y.o. female with medical history significant for deafness, hypothyroidism, adrenal insufficiency, and type 2 diabetes mellitus, now presenting to the emergency department for evaluation of the left thigh pain.  Patient fell at home on 02/01/2018, was found later in the day by family, she was unable to bear weight, and was sent to the ED for further evaluation.  She had reportedly been in her usual state of health prior to the fall, denies recent fevers or chills, denied chest pain, and denies shortness of breath or cough.  Per the report of her son at the bedside, she is typically able to ascend a flight of stairs without shortness of breath and does not have any known history of heart disease.  Admitted for further management of L femur fracture.  Assessment & Plan:   Principal Problem:   Femur fracture, left (HCC) Active Problems:   Anemia   Thrombocytopenia (HCC)   Hypokalemia   Hypomagnesemia   Adrenal insufficiency (HCC)   Hypothyroidism   Type II diabetes mellitus (HCC)   Elevated troponin   1. Left femur fracture  - Presents with left thigh pain after a fall at home and radiographs reveal acute comminuted displaced proximal femur fracture  - Orthopedic surgery is consulting and much appreciated  - Cardiology evaluation has been requested in light of elevated troponin.  Appreciate Dr. Damian Leavell assistance.  - Now s/p IM nail of L femur and placement/removal of distal femoral traction pin on 4/25 - Will discuss plan for DVT ppx with orthopedics (lovenox x30 days post op, will start this tomorrow, currently receiving subcutaneous heparin) - Correct electrolytes, continue gentle IVF hydration, continue analgesia and supportive care    2. Elevated troponin  - Troponin elevated to 0.55 in ED.  Peaked to 0.92, now  downtrending. - She denies chest pain  - Most likely reflects a demand ischemia in setting of acute femur fracture  - Continue cardiac monitoring, trend troponin, request cardiology to evaluate prior to surgery  - appreciate Dr. Rosemary Holms.  Recommending ASA, heparin gtt, lipitor.  Follow echo (see below).  Recommending medical management. - Of note, hyperdynamic systolic function with increased LVOT gradients.  Will need to f/u with cards as outpatient for repeat echo.  3. Hypokalemia; hypomagnesemia  - improved.  Stable  4. Anemia; thrombocytopenia  - continue to monitor.   - Hgb is 8.4 and platelets 115k on admission.  Improved on 4/25 AM, but then dropped in the afternoon - The Hb of 4 was likely spurious as she received 4 units pRBC and now her Hb is 13.5 (peaked at 16.3) - iron panel notable for low iron.  Normal folate and b12.  Hold off on iron supplementation with transfusion above.  5. Hypothyroidism  - Continue Synthroid    6. Type II DM  - No A1c on file  - Managed with metformin at home, held on admission  - Check CBG's and use a SSI with Novolog while in hospital   7. Adrenal insufficiency  - Increased Cortef in setting of acute illness   # Hypertension: started on metop, add amlodipine.  BP's significantly elevated.  Prn labetalol.   # AKI: baseline < 1, peaked at 1.21.  Now improving.  Follow UA (with 30 mg/dl protein and no RBC's).  Continue IVF.  I/O, daily weights.  # Elevated LFTs:  follow hepatitis panel (negative), follow RUQ [place] (negative)  # NAGMA: resolved  # Patient is deaf and has difficulty seeing:  Typically communicates with white board or pen and paper.  Her son is very helpful in this aspect, but notes he's having trouble communicating with her here because she doesn't have what she normally has (magnifying glass, etc).  # Delirium: Doing better this morning, but still confused.  Difficult to orient her with her problems with vision and hearing.   Doing better.  Will continue to monitor.   Delirium precautions.   Continue to monitor.  DVT prophylaxis: heparin, lovenox tomorrow Code Status: full  Family Communication: discussed with son at bedside Disposition Plan: SNF when bed available   Consultants:   Ortho  cardiology  Procedures:   IM nail of L femur.  Placement and removal of distal femoral traction pin. 4/25. Echo 4/25 Study Conclusions  - Left ventricle: The cavity size was normal. There was focal basal   hypertrophy. No SAM. Increased LVOT gradients likely due to   hyperdynamic LV. Systolic function was hyperdynamic. The   estimated ejection fraction was in the range of 70% to 75%. Wall   motion was normal; there were no regional wall motion   abnormalities. Doppler parameters are consistent with abnormal   left ventricular relaxation (grade 1 diastolic dysfunction). - No significant valvular abnormality.  Recommendations:  Avoid perioperative hypotension. Recommend maintaining adequate volume status with fluids +/- blood transfusion. In case of hypotension, consider IV phenylephrine to avoid further LVOT obstruction.  4/26  Intramedullary nailing of left femur Placement and removal of distal femoral traction pin  Antimicrobials:  Anti-infectives (From admission, onward)   Start     Dose/Rate Route Frequency Ordered Stop   02/03/18 1100  ceFAZolin (ANCEF) IVPB 1 g/50 mL premix     1 g 100 mL/hr over 30 Minutes Intravenous Every 8 hours 02/03/18 1028 02/04/18 0345   02/02/18 1727  vancomycin (VANCOCIN) powder  Status:  Discontinued       As needed 02/02/18 1727 02/02/18 1758   02/02/18 1515  ceFAZolin (ANCEF) IVPB 2g/100 mL premix     2 g 200 mL/hr over 30 Minutes Intravenous  Once 02/02/18 1511 02/02/18 1600   02/02/18 1507  ceFAZolin (ANCEF) 2-4 GM/100ML-% IVPB    Note to Pharmacy:  Ray Church   : cabinet override      02/02/18 1507 02/02/18 1550     Subjective: Seems to be doing  better. Talking with sons.  Able to see son waving to her.   Younger son here today as well.  Objective: Vitals:   02/03/18 1459 02/03/18 2249 02/04/18 0627 02/04/18 1208  BP: (!) 143/57 (!) 163/68 (!) 197/74 (!) 176/77  Pulse: 74 98 89 72  Resp: 13   12  Temp:  98.2 F (36.8 C) (!) 97.5 F (36.4 C) 98.1 F (36.7 C)  TempSrc:  Oral Oral Oral  SpO2: 99% 99% 99% 97%  Weight:   67.8 kg (149 lb 8 oz)   Height:        Intake/Output Summary (Last 24 hours) at 02/04/2018 1556 Last data filed at 02/04/2018 0900 Gross per 24 hour  Intake 120 ml  Output -  Net 120 ml   Filed Weights   02/01/18 2215 02/04/18 0627  Weight: 54.4 kg (120 lb) 67.8 kg (149 lb 8 oz)    Examination:  General: No acute distress. Cardiovascular: Heart sounds show a regular rate, and rhythm. No gallops or  rubs. No murmurs. No JVD. Lungs: Clear to auscultation bilaterally with good air movement. No rales, rhonchi or wheezes. Abdomen: Soft, nontender, nondistended with normal active bowel sounds. No masses. No hepatosplenomegaly. Neurological: Alert, unable to assess orientation. Moves all extremities 4. Cranial nerves II through XII grossly intact. Skin: Warm and dry. No rashes or lesions. Extremities: No clubbing or cyanosis. No edema. LLE with intact dressing   Data Reviewed: I have personally reviewed following labs and imaging studies  CBC: Recent Labs  Lab 02/02/18 0328 02/02/18 1715 02/02/18 2204 02/03/18 0633 02/03/18 0949 02/04/18 0643  WBC 12.2* 7.5 11.8* 11.7*  --  12.2*  HGB 10.0* 4.0* 16.3* 15.0 14.6 13.5  HCT 30.2* 12.1* 47.6* 43.3 42.8 39.6  MCV 78.9 79.6 83.7 81.9  --  81.6  PLT 174 83* 85* 99*  --  105*   Basic Metabolic Panel: Recent Labs  Lab 02/01/18 2227 02/01/18 2316 02/02/18 0328 02/03/18 0633 02/04/18 0643  NA 141  --  140 139 140  K 2.8*  --  3.7 5.0 4.1  CL 118*  --  107 110 107  CO2 16*  --  24 20* 25  GLUCOSE 95  --  113* 171* 138*  BUN 13  --  CREATININE 0.79  --  1.12* 1.21* 1.13*  CALCIUM 6.6*  --  8.7* 8.2* 8.8*  MG  --  1.5* 2.6* 2.2 2.3   GFR: Estimated Creatinine Clearance: 41 mL/min (A) (by C-G formula based on SCr of 1.13 mg/dL (H)). Liver Function Tests: Recent Labs  Lab 02/01/18 2316 02/02/18 0328 02/03/18 0633 02/04/18 0643  AST 21 24 172* 80*  ALT 6* 10* 103* 66*  ALKPHOS 37* 44 47 52  BILITOT 0.7 0.7 1.6* 0.9  PROT 4.5* 5.1* 5.4* 5.6*  ALBUMIN 2.5* 3.0* 3.0* 2.9*   No results for input(s): LIPASE, AMYLASE in the last 168 hours. No results for input(s): AMMONIA in the last 168 hours. Coagulation Profile: Recent Labs  Lab 02/01/18 2316  INR 0.95   Cardiac Enzymes: Recent Labs  Lab 02/01/18 2259 02/02/18 0015 02/02/18 0328 02/02/18 0956 02/02/18 1805  CKTOTAL 98  --   --   --   --   TROPONINI  --  0.29* 0.92* 0.51* 0.24*   BNP (last 3 results) No results for input(s): PROBNP in the last 8760 hours. HbA1C: No results for input(s): HGBA1C in the last 72 hours. CBG: Recent Labs  Lab 02/03/18 0519 02/03/18 1238 02/03/18 1618 02/04/18 0044 02/04/18 0633  GLUCAP 179* 138* 208* 189* 132*   Lipid Profile: No results for input(s): CHOL, HDL, LDLCALC, TRIG, CHOLHDL, LDLDIRECT in the last 72 hours. Thyroid Function Tests: No results for input(s): TSH, T4TOTAL, FREET4, T3FREE, THYROIDAB in the last 72 hours. Anemia Panel: Recent Labs    02/02/18 0328  VITAMINB12 1,144*  FOLATE 26.1  FERRITIN 242  TIBC 176*  IRON 14*  RETICCTPCT 1.1   Sepsis Labs: No results for input(s): PROCALCITON, LATICACIDVEN in the last 168 hours.  No results found for this or any previous visit (from the past 240 hour(s)).       Radiology Studies: Dg C-arm 1-60 Min  Result Date: 02/02/2018 CLINICAL DATA:  Intramedullary rod fixation of left femur. EXAM: DG C-ARM 61-120 MIN; LEFT FEMUR 2 VIEWS Radiation exposure index: 20.44 mGy. COMPARISON:  Radiographs of February 01, 2018. FINDINGS: Twelve intraoperative  fluoroscopic images were obtained of the left femur. These demonstrate intramedullary rod fixation of comminuted proximal  left femoral shaft fracture. Good alignment of fracture components is noted. IMPRESSION: Status post intramedullary rod fixation of comminuted proximal left femoral shaft fracture. Electronically Signed   By: Lupita Raider, M.D.   On: 02/02/2018 17:53   Dg C-arm 1-60 Min  Result Date: 02/02/2018 CLINICAL DATA:  Intramedullary rod fixation of left femur. EXAM: DG C-ARM 61-120 MIN; LEFT FEMUR 2 VIEWS Radiation exposure index: 20.44 mGy. COMPARISON:  Radiographs of February 01, 2018. FINDINGS: Twelve intraoperative fluoroscopic images were obtained of the left femur. These demonstrate intramedullary rod fixation of comminuted proximal left femoral shaft fracture. Good alignment of fracture components is noted. IMPRESSION: Status post intramedullary rod fixation of comminuted proximal left femoral shaft fracture. Electronically Signed   By: Lupita Raider, M.D.   On: 02/02/2018 17:53   Dg Femur Min 2 Views Left  Result Date: 02/02/2018 CLINICAL DATA:  Intramedullary rod fixation of left femur. EXAM: DG C-ARM 61-120 MIN; LEFT FEMUR 2 VIEWS Radiation exposure index: 20.44 mGy. COMPARISON:  Radiographs of February 01, 2018. FINDINGS: Twelve intraoperative fluoroscopic images were obtained of the left femur. These demonstrate intramedullary rod fixation of comminuted proximal left femoral shaft fracture. Good alignment of fracture components is noted. IMPRESSION: Status post intramedullary rod fixation of comminuted proximal left femoral shaft fracture. Electronically Signed   By: Lupita Raider, M.D.   On: 02/02/2018 17:53   Dg Femur Port Min 2 Views Left  Result Date: 02/02/2018 CLINICAL DATA:  Postop EXAM: LEFT FEMUR PORTABLE 2 VIEWS COMPARISON:  02/01/2018 FINDINGS: Interval intramedullary rod and screw fixation of the left femur for comminuted fracture involving the proximal shaft of the  femur. Decreased angulation and displacement at the fracture site. Small soft tissue gas consistent with recent operative status. IMPRESSION: Interval intramedullary rod and screw fixation of left femur for comminuted fracture of the proximal femoral shaft with decreased displacement and angulation compared to prior. Expected postsurgical changes. Electronically Signed   By: Jasmine Pang M.D.   On: 02/02/2018 19:11   US Abdomen Limited Ruq  Result Date: 02/03/2018 CLINICAL DATA:  Elevated LFTs EXAM: ULTRASOUND ABDOMEN LIMITED RIGHT UPPER QUADRANT COMPARISON:  None. FINDINGS: Gallbladder: No gallstones or wall thickening visualized. No sonographic Murphy sign noted by sonographer. Common bile duct: Diameter: 3.2 mm Liver: No focal lesion identified. Within normal limits in parenchymal echogenicity. Portal vein is patent on color Doppler imaging with normal direction of blood flow towards the liver. IMPRESSION: Negative right upper quadrant abdominal ultrasound Electronically Signed   By: Jasmine Pang M.D.   On: 02/03/2018 21:40        Scheduled Meds: . amLODipine  5 mg Oral Daily  . aspirin  81 mg Oral Daily  . atorvastatin  80 mg Oral q1800  . heparin injection (subcutaneous)  5,000 Units Subcutaneous Q8H  . hydrocortisone  30 mg Oral Q breakfast  . insulin aspart  0-9 Units Subcutaneous TID WC  . levothyroxine  75 mcg Oral QAC breakfast  . metoprolol tartrate  12.5 mg Oral BID  . multivitamin with minerals  1 tablet Oral Daily  . potassium chloride  40 mEq Oral Once  . prednisoLONE acetate  1 drop Both Eyes BID  . sodium chloride  1 drop Both Eyes BID   Continuous Infusions: . lactated ringers 10 mL/hr at 02/02/18 1512     LOS: 2 days    Time spent: over 30 min    Lacretia Nicks, MD [facility] Pager [phone]  If 7PM-7AM, please contact  night-coverage [email or web address] Password [facility] 02/04/2018, 3:56 PM

## 2018-02-05 LAB — COMPREHENSIVE METABOLIC PANEL
ALBUMIN: 2.7 g/dL — AB (ref 3.5–5.0)
ALT: 42 U/L (ref 14–54)
ANION GAP: 4 — AB (ref 5–15)
AST: 54 U/L — AB (ref 15–41)
Alkaline Phosphatase: 44 U/L (ref 38–126)
BILIRUBIN TOTAL: 1.3 mg/dL — AB (ref 0.3–1.2)
BUN: 14 mg/dL (ref 6–20)
CHLORIDE: 108 mmol/L (ref 101–111)
CO2: 28 mmol/L (ref 22–32)
Calcium: 8.6 mg/dL — ABNORMAL LOW (ref 8.9–10.3)
Creatinine, Ser: 0.99 mg/dL (ref 0.44–1.00)
GFR calc Af Amer: >60 mL/min (ref >60)
GFR calc non Af Amer: 55 mL/min — ABNORMAL LOW (ref >60)
GLUCOSE: 103 mg/dL — AB (ref 65–99)
POTASSIUM: 4 mmol/L (ref 3.5–5.1)
SODIUM: 140 mmol/L (ref 135–145)
Total Protein: 5.3 g/dL — ABNORMAL LOW (ref 6.5–8.1)

## 2018-02-05 LAB — GLUCOSE, CAPILLARY
GLUCOSE-CAPILLARY: 79 mg/dL (ref 65–99)
GLUCOSE-CAPILLARY: 104 mg/dL — AB (ref 65–99)
GLUCOSE-CAPILLARY: 125 mg/dL — AB (ref 65–99)
GLUCOSE-CAPILLARY: 135 mg/dL — AB (ref 65–99)
Glucose-Capillary: 132 mg/dL — ABNORMAL HIGH (ref 65–99)

## 2018-02-05 LAB — MAGNESIUM: Magnesium: 2.3 mg/dL (ref 1.7–2.4)

## 2018-02-05 LAB — CBC
HEMATOCRIT: 37.9 % (ref 36.0–46.0)
Hemoglobin: 13.1 g/dL (ref 12.0–15.0)
MCH: 29 pg (ref 26.0–34.0)
MCHC: 34.6 g/dL (ref 30.0–36.0)
MCV: 83.8 fL (ref 78.0–100.0)
PLATELETS: 120 10*3/uL — AB (ref 150–400)
RBC: 4.52 MIL/uL (ref 3.87–5.11)
RDW: 16.2 % — AB (ref 11.5–15.5)
WBC: 9.8 10*3/uL (ref 4.0–10.5)

## 2018-02-05 MED ORDER — INSULIN ASPART 100 UNIT/ML ~~LOC~~ SOLN
0.0000 [IU] | Freq: Three times a day (TID) | SUBCUTANEOUS | Status: DC
  Administered 2018-02-05: 1 [IU] via SUBCUTANEOUS
  Administered 2018-02-06: 2 [IU] via SUBCUTANEOUS
  Administered 2018-02-06: 1 [IU] via SUBCUTANEOUS

## 2018-02-05 MED ORDER — INSULIN ASPART 100 UNIT/ML ~~LOC~~ SOLN: 0.0000 [IU] | Freq: Every day | SUBCUTANEOUS | Status: DC

## 2018-02-05 NOTE — Progress Notes (Addendum)
PROGRESS NOTE    [name]  WUJ:[id number] DOB: May 30, 1946 DOA: 02/01/2018 PCP: Billee Cashing, MD   Brief Narrative:   [name] is [name] 72 y.o. female with medical history significant for deafness, hypothyroidism, adrenal insufficiency, and type 2 diabetes mellitus, now presenting to the emergency department for evaluation of the left thigh pain.  Patient fell at home on 02/01/2018, was found later in the day by family, she was unable to bear weight, and was sent to the ED for further evaluation.  She had reportedly been in her usual state of health prior to the fall, denies recent fevers or chills, denied chest pain, and denies shortness of breath or cough.  Per the report of her son at the bedside, she is typically able to ascend [place] Donahue flight of stairs without shortness of breath and does not have any known history of heart disease.  Admitted for further management of L femur fracture.  Assessment & Plan:   Principal Problem:   Femur fracture, left (HCC) Active Problems:   Anemia   Thrombocytopenia (HCC)   Hypokalemia   Hypomagnesemia   Adrenal insufficiency (HCC)   Hypothyroidism   Type II diabetes mellitus (HCC)   Elevated troponin   1. Left femur fracture  - Presents with left thigh pain after Mohid Furuya fall at home and radiographs reveal acute comminuted displaced proximal femur fracture  - Orthopedic surgery is consulting and much appreciated  - Cardiology evaluation has been requested in light of elevated troponin.  Appreciate Dr. Damian Leavell assistance.  - Now s/p IM nail of L femur and placement/removal of distal femoral traction pin on 4/25 - DVT ppx with lovenox x30 days post op - Correct electrolytes, continue gentle IVF hydration, continue analgesia and supportive care    2. Elevated troponin  - Troponin elevated to 0.55 in ED.  Peaked to 0.92, now downtrending. - She denies chest pain  - Most likely reflects Jailyn Langhorst demand ischemia in setting of acute femur fracture    - Continue cardiac monitoring, trend troponin, request cardiology to evaluate prior to surgery  - appreciate Dr. Rosemary Holms.  Recommending ASA, heparin gtt, lipitor.  Follow echo (see below).  Recommending medical management. - Of note, hyperdynamic systolic function with increased LVOT gradients.    Will need to f/u with cards as outpatient for repeat echo.  3. Hypokalemia; hypomagnesemia  - improved.  Stable  4. Anemia; thrombocytopenia  - continue to monitor.   - thrombocytopenia improving.  H/h recently relatively stable (slowly downtrending, but no evidence of bleeding) - Hgb is 8.4 and platelets 115k on admission.  Improved on 4/25 AM, but then dropped in the afternoon - The Hb of 4 was likely spurious as she received 4 units pRBC and now her Hb is 13.1 (peaked at 16.3) - iron panel notable for low iron.  Normal folate and b12.  Hold off on iron supplementation with transfusion above.  5. Hypothyroidism  - Continue Synthroid    6. Type II DM  - No A1c on file  - Managed with metformin at home, held on admission  - Check CBG's and use Josiah Wojtaszek SSI with Novolog while in hospital   7. Adrenal insufficiency  - Increased Cortef in setting of acute illness   # Hypertension: started on metop, add amlodipine.  BP's significantly elevated.  Improving.  Follow.  Prn labetalol.   # AKI: baseline < 1, peaked at 1.21.  Now improving.  Follow UA (with 30 mg/dl protein and no RBC's).  Continue IVF.  I/O, daily weights.  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  Her son is very helpful in this aspect, but notes he's having trouble communicating with her here because she doesn't have what she normally has (magnifying glass, etc).  # Delirium: Improved.  Difficult to orient her with her problems with vision and hearing.  Will  continue to monitor.   Delirium precautions.   Continue to monitor.  DVT prophylaxis: lovenox tomorrow Code Status: full  Family Communication: discussed with son at bedside Disposition Plan: SNF when bed available   Consultants:   Ortho  cardiology  Procedures:   IM nail of L femur.  Placement and removal of distal femoral traction pin. 4/25. Echo 4/25 Study Conclusions  - Left ventricle: The cavity size was normal. There was focal basal   hypertrophy. No SAM. Increased LVOT gradients likely due to   hyperdynamic LV. Systolic function was hyperdynamic. The   estimated ejection fraction was in the range of 70% to 75%. Wall   motion was normal; there were no regional wall motion   abnormalities. Doppler parameters are consistent with abnormal   left ventricular relaxation (grade 1 diastolic dysfunction). - No significant valvular abnormality.  Recommendations:  Avoid perioperative hypotension. Recommend maintaining adequate volume status with fluids +/- blood transfusion. In case of hypotension, consider IV phenylephrine to avoid further LVOT obstruction.  4/26  Intramedullary nailing of left femur Placement and removal of distal femoral traction pin  Antimicrobials:  Anti-infectives (From admission, onward)   Start     Dose/Rate Route Frequency Ordered Stop   02/03/18 1100  ceFAZolin (ANCEF) IVPB 1 g/50 mL premix     1 g 100 mL/hr over 30 Minutes Intravenous Every 8 hours 02/03/18 1028 02/04/18 0345   02/02/18 1727  vancomycin (VANCOCIN) powder  Status:  Discontinued       As needed 02/02/18 1727 02/02/18 1758   02/02/18 1515  ceFAZolin (ANCEF) IVPB 2g/100 mL premix     2 g 200 mL/hr over 30 Minutes Intravenous  Once 02/02/18 1511 02/02/18 1600   02/02/18 1507  ceFAZolin (ANCEF) 2-4 GM/100ML-% IVPB    Note to Pharmacy:  Ray Church   : cabinet override      02/02/18 1507 02/02/18 1550     Subjective: Doing better. Able to see her son more  consistently.  Son assists with communciation.  Objective: Vitals:   02/04/18 1208 02/04/18 1936 02/05/18 0636 02/05/18 1518  BP: (!) 176/77 (!) 160/62 (!) 154/72 (!) 123/46  Pulse: 72 82 77 73  Resp: 12   12  Temp: 98.1 F (36.7 C) 98.1 F (36.7 C) 98.2 F (36.8 C)   TempSrc: Oral Oral Oral   SpO2: 97% 99% 100% 99%  Weight:   67.3 kg (148 lb 4.8 oz)   Height:        Intake/Output Summary (Last 24 hours) at 02/05/2018 1636 Last data filed at 02/05/2018 0900 Gross per 24 hour  Intake 120 ml  Output 650 ml  Net -530 ml   Filed Weights   02/01/18 2215 02/04/18 0627 02/05/18 0636  Weight: 54.4 kg (120 lb) 67.8 kg (149 lb 8 oz) 67.3 kg (148 lb 4.8 oz)    Examination:  General: No acute distress. Cardiovascular: Heart sounds show Wyatt Galvan regular rate, and rhythm. No gallops or rubs. No  murmurs. No JVD. Lungs: Clear to auscultation bilaterally with good air movement. No rales, rhonchi or wheezes. Abdomen: Soft, nontender, nondistended with normal active bowel sounds. No masses. No hepatosplenomegaly. Neurological: Alert. Moves all extremities 4. Cranial nerves II through XII grossly intact. Skin: Warm and dry. No rashes or lesions. Extremities: LLE with intact dressing Psychiatric: Mood and affect are normal. Insight and judgment are appropriate.  Data Reviewed: I have personally reviewed following labs and imaging studies  CBC: Recent Labs  Lab 02/02/18 1715 02/02/18 2204 02/03/18 0633 02/03/18 0949 02/04/18 0643 02/05/18 0546  WBC 7.5 11.8* 11.7*  --  12.2* 9.8  HGB 4.0* 16.3* 15.0 14.6 13.5 13.1  HCT 12.1* 47.6* 43.3 42.8 39.6 37.9  MCV 79.6 83.7 81.9  --  81.6 83.8  PLT 83* 85* 99*  --  105* 120*   Basic Metabolic Panel: Recent Labs  Lab 02/01/18 2227 02/01/18 2316 02/02/18 0328 02/03/18 0633 02/04/18 0643 02/05/18 0546  NA 141  --  140 139 140 140  K 2.8*  --  3.7 5.0 4.1 4.0  CL 118*  --  107 110 107 108  CO2 16*  --  24 20* 25 28  GLUCOSE 95  --  113*  171* 138* 103*  BUN 13  --  CREATININE 0.79  --  1.12* 1.21* 1.13* 0.99  CALCIUM 6.6*  --  8.7* 8.2* 8.8* 8.6*  MG  --  1.5* 2.6* 2.2 2.3 2.3   GFR: Estimated Creatinine Clearance: 46.7 mL/min (by C-G formula based on SCr of 0.99 mg/dL). Liver Function Tests: Recent Labs  Lab 02/01/18 2316 02/02/18 0328 02/03/18 1610 02/04/18 0643 02/05/18 0546  AST 21 24 172* 80* 54*  ALT 6* 10* 103* 66* 42  ALKPHOS 37* 44 47 52 44  BILITOT 0.7 0.7 1.6* 0.9 1.3*  PROT 4.5* 5.1* 5.4* 5.6* 5.3*  ALBUMIN 2.5* 3.0* 3.0* 2.9* 2.7*   No results for input(s): LIPASE, AMYLASE in the last 168 hours. No results for input(s): AMMONIA in the last 168 hours. Coagulation Profile: Recent Labs  Lab 02/01/18 2316  INR 0.95   Cardiac Enzymes: Recent Labs  Lab 02/01/18 2259 02/02/18 0015 02/02/18 0328 02/02/18 0956 02/02/18 1805  CKTOTAL 98  --   --   --   --   TROPONINI  --  0.29* 0.92* 0.51* 0.24*   BNP (last 3 results) No results for input(s): PROBNP in the last 8760 hours. HbA1C: No results for input(s): HGBA1C in the last 72 hours. CBG: Recent Labs  Lab 02/04/18 1654 02/04/18 2044 02/05/18 0702 02/05/18 1234 02/05/18 1633  GLUCAP 196* 159* 79 104* 125*   Lipid Profile: No results for input(s): CHOL, HDL, LDLCALC, TRIG, CHOLHDL, LDLDIRECT in the last 72 hours. Thyroid Function Tests: No results for input(s): TSH, T4TOTAL, FREET4, T3FREE, THYROIDAB in the last 72 hours. Anemia Panel: No results for input(s): VITAMINB12, FOLATE, FERRITIN, TIBC, IRON, RETICCTPCT in the last 72 hours. Sepsis Labs: No results for input(s): PROCALCITON, LATICACIDVEN in the last 168 hours.  No results found for this or any previous visit (from the past 240 hour(s)).       Radiology Studies: US Abdomen Limited Ruq  Result Date: 02/03/2018 CLINICAL DATA:  Elevated LFTs EXAM: ULTRASOUND ABDOMEN LIMITED RIGHT UPPER QUADRANT COMPARISON:  None. FINDINGS: Gallbladder: No gallstones or wall  thickening visualized. No sonographic Murphy sign noted by sonographer. Common bile duct: Diameter: 3.2 mm Liver: No focal lesion identified. Within normal limits in parenchymal  echogenicity. Portal vein is patent on color Doppler imaging with normal direction of blood flow towards the liver. IMPRESSION: Negative right upper quadrant abdominal ultrasound Electronically Signed   By: Jasmine Pang M.D.   On: 02/03/2018 21:40        Scheduled Meds: . amLODipine  5 mg Oral Daily  . aspirin  81 mg Oral Daily  . atorvastatin  80 mg Oral q1800  . enoxaparin (LOVENOX) injection  40 mg Subcutaneous Q24H  . hydrocortisone  30 mg Oral Q breakfast  . insulin aspart  0-9 Units Subcutaneous TID WC  . levothyroxine  75 mcg Oral QAC breakfast  . metoprolol tartrate  12.5 mg Oral BID  . multivitamin with minerals  1 tablet Oral Daily  . potassium chloride  40 mEq Oral Once  . prednisoLONE acetate  1 drop Both Eyes BID  . sodium chloride  1 drop Both Eyes BID   Continuous Infusions:    LOS: 3 days    Time spent: over 30 min    Lacretia Nicks, MD [facility] Pager [phone]  If 7PM-7AM, please contact night-coverage [email or web address] Password [facility] 02/05/2018, 4:36 PM

## 2018-02-05 NOTE — Plan of Care (Signed)

## 2018-02-06 ENCOUNTER — Encounter (HOSPITAL_COMMUNITY): Payer: Self-pay | Admitting: Student

## 2018-02-06 DIAGNOSIS — S7292XS Unspecified fracture of left femur, sequela: Secondary | ICD-10-CM | POA: Diagnosis not present

## 2018-02-06 DIAGNOSIS — E7849 Other hyperlipidemia: Secondary | ICD-10-CM | POA: Diagnosis not present

## 2018-02-06 DIAGNOSIS — R031 Nonspecific low blood-pressure reading: Secondary | ICD-10-CM | POA: Diagnosis not present

## 2018-02-06 DIAGNOSIS — G8911 Acute pain due to trauma: Secondary | ICD-10-CM | POA: Diagnosis not present

## 2018-02-06 DIAGNOSIS — R5383 Other fatigue: Secondary | ICD-10-CM | POA: Diagnosis not present

## 2018-02-06 DIAGNOSIS — H548 Legal blindness, as defined in USA: Secondary | ICD-10-CM | POA: Diagnosis not present

## 2018-02-06 DIAGNOSIS — I10 Essential (primary) hypertension: Secondary | ICD-10-CM | POA: Diagnosis not present

## 2018-02-06 DIAGNOSIS — R509 Fever, unspecified: Secondary | ICD-10-CM | POA: Diagnosis not present

## 2018-02-06 DIAGNOSIS — E038 Other specified hypothyroidism: Secondary | ICD-10-CM | POA: Diagnosis not present

## 2018-02-06 DIAGNOSIS — D508 Other iron deficiency anemias: Secondary | ICD-10-CM | POA: Diagnosis not present

## 2018-02-06 DIAGNOSIS — D649 Anemia, unspecified: Secondary | ICD-10-CM | POA: Diagnosis not present

## 2018-02-06 DIAGNOSIS — S8991XA Unspecified injury of right lower leg, initial encounter: Secondary | ICD-10-CM | POA: Diagnosis not present

## 2018-02-06 DIAGNOSIS — E118 Type 2 diabetes mellitus with unspecified complications: Secondary | ICD-10-CM | POA: Diagnosis not present

## 2018-02-06 DIAGNOSIS — E2740 Unspecified adrenocortical insufficiency: Secondary | ICD-10-CM | POA: Diagnosis not present

## 2018-02-06 DIAGNOSIS — M6281 Muscle weakness (generalized): Secondary | ICD-10-CM | POA: Diagnosis not present

## 2018-02-06 DIAGNOSIS — R4182 Altered mental status, unspecified: Secondary | ICD-10-CM | POA: Diagnosis not present

## 2018-02-06 DIAGNOSIS — H93013 Transient ischemic deafness, bilateral: Secondary | ICD-10-CM | POA: Diagnosis not present

## 2018-02-06 DIAGNOSIS — E119 Type 2 diabetes mellitus without complications: Secondary | ICD-10-CM | POA: Diagnosis not present

## 2018-02-06 DIAGNOSIS — S7292XA Unspecified fracture of left femur, initial encounter for closed fracture: Secondary | ICD-10-CM | POA: Diagnosis not present

## 2018-02-06 DIAGNOSIS — R112 Nausea with vomiting, unspecified: Secondary | ICD-10-CM | POA: Diagnosis not present

## 2018-02-06 DIAGNOSIS — D696 Thrombocytopenia, unspecified: Secondary | ICD-10-CM | POA: Diagnosis not present

## 2018-02-06 DIAGNOSIS — E871 Hypo-osmolality and hyponatremia: Secondary | ICD-10-CM | POA: Diagnosis not present

## 2018-02-06 LAB — CBC
HCT: 33.6 % — ABNORMAL LOW (ref 36.0–46.0)
Hemoglobin: 11 g/dL — ABNORMAL LOW (ref 12.0–15.0)
MCH: 27.9 pg (ref 26.0–34.0)
MCHC: 32.7 g/dL (ref 30.0–36.0)
MCV: 85.3 fL (ref 78.0–100.0)
PLATELETS: 141 10*3/uL — AB (ref 150–400)
RBC: 3.94 MIL/uL (ref 3.87–5.11)
RDW: 16.7 % — AB (ref 11.5–15.5)
WBC: 7.6 10*3/uL (ref 4.0–10.5)

## 2018-02-06 LAB — COMPREHENSIVE METABOLIC PANEL
ALT: 31 U/L (ref 14–54)
AST: 38 U/L (ref 15–41)
Albumin: 2.4 g/dL — ABNORMAL LOW (ref 3.5–5.0)
Alkaline Phosphatase: 42 U/L (ref 38–126)
Anion gap: 3 — ABNORMAL LOW (ref 5–15)
BUN: 15 mg/dL (ref 6–20)
CHLORIDE: 107 mmol/L (ref 101–111)
CO2: 31 mmol/L (ref 22–32)
Calcium: 8.6 mg/dL — ABNORMAL LOW (ref 8.9–10.3)
Creatinine, Ser: 0.98 mg/dL (ref 0.44–1.00)
GFR, EST NON AFRICAN AMERICAN: 56 mL/min — AB (ref >60)
Glucose, Bld: 89 mg/dL (ref 65–99)
POTASSIUM: 4.5 mmol/L (ref 3.5–5.1)
Sodium: 141 mmol/L (ref 135–145)
Total Bilirubin: 1 mg/dL (ref 0.3–1.2)
Total Protein: 4.9 g/dL — ABNORMAL LOW (ref 6.5–8.1)

## 2018-02-06 LAB — GLUCOSE, CAPILLARY
GLUCOSE-CAPILLARY: 122 mg/dL — AB (ref 65–99)
Glucose-Capillary: 84 mg/dL (ref 65–99)
Glucose-Capillary: 166 mg/dL — ABNORMAL HIGH (ref 65–99)

## 2018-02-06 LAB — HEMOGLOBIN AND HEMATOCRIT, BLOOD
HEMATOCRIT: 33.1 % — AB (ref 36.0–46.0)
HEMATOCRIT: 34.9 % — AB (ref 36.0–46.0)
HEMOGLOBIN: 10.8 g/dL — AB (ref 12.0–15.0)
Hemoglobin: 11.7 g/dL — ABNORMAL LOW (ref 12.0–15.0)

## 2018-02-06 LAB — MAGNESIUM: MAGNESIUM: 2.3 mg/dL (ref 1.7–2.4)

## 2018-02-06 MED ORDER — SENNOSIDES-DOCUSATE SODIUM 8.6-50 MG PO TABS: 1.0000 | ORAL_TABLET | Freq: Every evening | ORAL | 0 refills | Status: AC | PRN

## 2018-02-06 MED ORDER — METOPROLOL TARTRATE 25 MG PO TABS: 12.5000 mg | ORAL_TABLET | Freq: Two times a day (BID) | ORAL | 0 refills | Status: DC

## 2018-02-06 MED ORDER — ATORVASTATIN CALCIUM 80 MG PO TABS: 80.0000 mg | ORAL_TABLET | Freq: Every day | ORAL | 0 refills | Status: DC

## 2018-02-06 MED ORDER — POLYETHYLENE GLYCOL 3350 17 G PO PACK
17.0000 g | PACK | Freq: Two times a day (BID) | ORAL | Status: DC
  Administered 2018-02-06: 17 g via ORAL
  Filled 2018-02-06: qty 1

## 2018-02-06 MED ORDER — ASPIRIN 81 MG PO CHEW: 81.0000 mg | CHEWABLE_TABLET | Freq: Every day | ORAL | 0 refills | Status: DC

## 2018-02-06 MED ORDER — AMLODIPINE BESYLATE 5 MG PO TABS: 5.0000 mg | ORAL_TABLET | Freq: Every day | ORAL | 0 refills | Status: DC

## 2018-02-06 MED ORDER — POLYETHYLENE GLYCOL 3350 17 G PO PACK: 17.0000 g | PACK | Freq: Two times a day (BID) | ORAL | 0 refills | Status: AC

## 2018-02-06 NOTE — Social Work (Signed)
Clinical Social Worker facilitated patient discharge including contacting patient family and facility to confirm patient discharge plans.  Clinical information faxed to facility and family agreeable with plan.    CSW arranged ambulance transport via [facility] to [facility].    RN to call [phone] to give report prior to discharge. Pt going to Room 115.  Clinical Social Worker will sign off for now as social work intervention is no longer needed. Please consult [place] again if new need arises.  Keene Breath, LCSW Clinical Social Worker [phone]

## 2018-02-06 NOTE — Plan of Care (Signed)

## 2018-02-06 NOTE — Clinical Social Work Note (Addendum)
CSW spoke with pt's son Glean Salen who has been visiting facilities. At this time he would like for the pt to go to [facility]. Facility notified. MD notified.   MD to check labs this afternoon.  [place], [place] [phone]

## 2018-02-06 NOTE — Progress Notes (Signed)
Physical Therapy Treatment Patient Details Name: [name] MRN: [id number] DOB: 1945-10-14 Today's Date: 02/06/2018    History of Present Illness [name] is a 72 y/o female who sustained a fall at home 4/24, came to [facility] on 4/25 and underwent IM nail left femur, now WBAT. At baseline pt ambulating limited community distances daily to the mailbox without AD as well as 13 stairs up/down to access her 2nd floor apartment. No prior falls history. Pt is legally deaf and legally blind although she can see well enough at baseline navigate a meal and feed self, shop at grocery store with her sons, AMB her home independently. Her son provdes history.     PT Comments    Pt progressing towards physical therapy goals. Was able to perform transfers and ambulation with up to mod assist for balance support and safety. Pt required assist to guide walker, initiate turns, provide balance support, and guide hands for placement on seated surface for safety. Pt's son present during session and assisted with communication. Son reports they anticipate d/c to SNF this afternoon. Will continue to follow.    Follow Up Recommendations  SNF;Supervision for mobility/OOB;Supervision/Assistance - 24 hour     Equipment Recommendations  Rolling walker with 5" wheels    Recommendations for Other Services       Precautions / Restrictions Precautions Precautions: Fall Precaution Comments: Blind and deaf. Son, Garth Bigness present and very helpful with communication.  Restrictions Weight Bearing Restrictions: Yes LLE Weight Bearing: Weight bearing as tolerated    Mobility  Bed Mobility Overal bed mobility: Needs Assistance Bed Mobility: Rolling;Sidelying to Sit Rolling: Supervision Sidelying to sit: Min guard       General bed mobility comments: Attempted to assist LLE towards R side of bed and pt rolled to the R. Guided pt's hands to the railing for support and pt was able to transition to full sitting with  hands-on guarding for assist.   Transfers Overall transfer level: Needs assistance Equipment used: Rolling walker (2 wheeled);1 person hand held assist Transfers: Sit to/from [employer] Sit to Stand: Min assist Stand pivot transfers: Mod assist       General transfer comment: Up to mod assist for transition bed>BSC without RW and gait belt utilized  Ambulation/Gait Ambulation/Gait assistance: Min assist Ambulation Distance (Feet): 5 Feet Assistive device: Rolling walker (2 wheeled) Gait Pattern/deviations: Step-through pattern;Decreased stride length;Trunk flexed Gait velocity: Decreased Gait velocity interpretation: <1.31 ft/sec, indicative of household ambulator General Gait Details: BSC>chair. From [facility], pt used walker to take a few steps around to the recliner chair. Pt required increased assist to manage walker and guide pt around. Pt attempting to reach away from the walker to outside surroundings, but was able to be redirected.    Stairs             Wheelchair Mobility    Modified Rankin (Stroke Patients Only)       Balance Overall balance assessment: Needs assistance Sitting-balance support: No upper extremity supported;Feet supported Sitting balance-Leahy Scale: Fair     Standing balance support: Bilateral upper extremity supported;During functional activity Standing balance-Leahy Scale: Poor Standing balance comment: Relies on BUE support.                             Cognition Arousal/Alertness: Awake/alert Behavior During Therapy: WFL for tasks assessed/performed Overall Cognitive Status: Difficult to assess  General Comments: Pt uses either writing or simple gestures for communication. When she asks a question family will gently assist her to nod or shake her head "no" to answer the question. She is able to read large print on white board. Able to follow simple gesture commands  and problem solve when using method of communication utilized at home.       Exercises General Exercises - Lower Extremity Ankle Circles/Pumps: 10 reps Quad Sets: (Attempted but unable) Heel Slides: 5 reps;AAROM Hip ABduction/ADduction: 5 reps;AAROM Straight Leg Raises: 5 reps;AAROM    General Comments General comments (skin integrity, edema, etc.): Son present and assisting with communication.       Pertinent Vitals/Pain Pain Assessment: Faces Faces Pain Scale: Hurts little more Pain Location: operative site; grimacing with movement Pain Descriptors / Indicators: Grimacing Pain Intervention(s): Limited activity within patient's tolerance;Monitored during session;Repositioned    Home Living                      Prior Function            PT Goals (current goals can now be found in the care plan section) Acute Rehab PT Goals PT Goal Formulation: Patient unable to participate in goal setting Progress towards PT goals: Progressing toward goals    Frequency    Min 3X/week      PT Plan Current plan remains appropriate    Co-evaluation              AM-PAC PT "6 Clicks" Daily Activity  Outcome Measure  Difficulty turning over in bed (including adjusting bedclothes, sheets and blankets)?: Unable Difficulty moving from lying on back to sitting on the side of the bed? : Unable Difficulty sitting down on and standing up from a chair with arms (e.g., wheelchair, bedside commode, etc,.)?: A Lot Help needed moving to and from a bed to chair (including a wheelchair)?: A Lot Help needed walking in hospital room?: A Lot Help needed climbing 3-5 steps with a railing? : Total 6 Click Score: 9    End of Session Equipment Utilized During Treatment: Gait belt Activity Tolerance: Patient tolerated treatment well Patient left: in chair;with call bell/phone within reach;with chair alarm set;with family/visitor present Nurse Communication: Mobility status PT Visit  Diagnosis: Muscle weakness (generalized) (M62.81);Other abnormalities of gait and mobility (R26.89);Difficulty in walking, not elsewhere classified (R26.2)     Time: 1610-9604 PT Time Calculation (min) (ACUTE ONLY): 34 min  Charges:  $Therapeutic Activity: 23-37 mins                    G Codes:       Conni Slipper, PT, DPT Acute Rehabilitation Services Pager: [phone]    Marylynn Pearson 02/06/2018, 1:01 PM

## 2018-02-06 NOTE — Discharge Summary (Signed)
Physician Discharge Summary  [name] ZOX:[id number] DOB: May 20, 1946 DOA: 02/01/2018  PCP: Billee Cashing, MD  Admit date: 02/01/2018 Discharge date: 02/06/2018  Time spent: 40 minutes  Recommendations for Outpatient Follow-up:  1. Follow up outpatient CBC/CMP in 1 day (attention to Hb.  Her Hb has slowly downtrended since transfusion, watch for nadir.  No evidence of bleeding here, hemodynamically stable.  Consider starting iron.) 2. Follow up with orthopedics as an outpatient  3. Follow up blood pressure, uptitrate meds as indicated.    Discharge Diagnoses:  Principal Problem:   Femur fracture, left (HCC) Active Problems:   Anemia   Thrombocytopenia (HCC)   Hypokalemia   Hypomagnesemia   Adrenal insufficiency (HCC)   Hypothyroidism   Type II diabetes mellitus (HCC)   Elevated troponin   Discharge Condition: stable  Diet recommendation: heart healthy  Filed Weights   02/01/18 2215 02/04/18 0627 02/05/18 0636  Weight: 54.4 kg (120 lb) 67.8 kg (149 lb 8 oz) 67.3 kg (148 lb 4.8 oz)    History of present illness:  Per HPI Joanne Simpsonis a 72 y.o.femalewith medical history significant fordeafness, hypothyroidism, adrenal insufficiency, and type 2 diabetes mellitus, now presenting to the emergency department for evaluation of the left thigh pain. Patient fell at home on 02/01/2018, was found later in the day by family, she was unable to bear weight, and was sent to the ED for further evaluation. She had reportedly been in her usual state of health prior to the fall, denies recent fevers or chills, denied chest pain, and denies shortness of breath or cough. Per the report of her son at the bedside, she is typically able to ascend a flight of stairs without shortness of breath and does not have any known history of heart disease.  Admitted for further management of L femur fracture.  She had echo on 4/25 with normal LV function and no wall motion abnormalities,  cardiology recommended medical management of elevated troponin with heparin, aspirin, atorvastatin.  She went to OR on 4/25 for IM nail of L femur.  She had a Hb of 4 perioperatively and received 4 units pRBC.  This Hb was likely spurious as her Hb post transfusion was 16.  Her Hb has slowly downtrended since and suspect her Hb will nadir soon.  She has been hemodynamically stable.  She was discharged on 4/29 to SNF.  She'll need outpatient follow up with cardiology, orthopedics, and her PCP.  (see below for additional details)  Hospital Course:  1.Left femur fracture -Presented with left thigh pain after a fall at home and radiographs reveal acute comminuted displaced proximal femur fracture -Cardiology evaluation was requested in light of elevated troponin.  Appreciate Dr. Damian Leavell assistance.  Echo was obtained prior to surgery, but additional invasive testing not recommended. - Now s/p IM nail of L femur and placement/removal of distal femoral traction pin on 4/25 by ortho - DVT ppx with lovenox x30 days post op per ortho  2.Elevated troponin -Troponin elevated to 0.55 in ED.  Peaked to 0.92, now downtrending. -She denies chest pain -Most likely reflects a demand ischemia in setting of acute femur fracture.  Seen by Dr. Rosemary Holms who recommended follow up echo and medical management (ASA, heparin gtt, lipitor).  She was initially started on heparin gtt, but this was d/c'd for surgery and not restarted due to concern for bleeding. - Echo with normal EF, grade 1 diastolic dysfunction.  No wall motion abnormality.  Recommend follow up with cards as outpatient  for repeat echo with increased LVOT gradients.  (see report below)   3.Hypokalemia; hypomagnesemia -improved.  Stable  4.Anemia; thrombocytopenia -Pt had Hb of 4 that was noted perioperatively.  She received 4 units pRBC and her Hb improved to 16.3.  Suspect that value was spurious.  Hb has been slowly downtrending  since.  Her Hb at admission was 8.4 and her Hb at discharge was 11.7 (which would be around what you might expect the change after transfusion of 4 units).  Would continue to watch H/H after discharge for nadir and work up further if continuing to fall.     - thrombocytopenia improving.   - Hgb is 8.4 and platelets 115k on admission.  - iron panel notable for low iron.  Normal folate and b12.  Hold off on iron supplementation for now with transfusion above, but may need this as outpatient.  5.Hypothyroidism -Continue Synthroid  6.Type II DM -No A1c on file -resume metformin  7.Adrenal insufficiency -resume home cortef   # Hypertension: started on metop, add amlodipine.  BP's significantly elevated.  Improving.  Follow as outpatient and adjust as needed.  # AKI: baseline < 1, peaked at 1.21.  Improved.  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  Sons also communicate by writing letters on her head or shaking her head yes or no when she asks a question.  Her sons are very helpful in communicating with her, but she's had some trouble communicating with her here because she doesn't have what she normally has (magnifying glass, etc) and her vision seemed worse when she initially arrived (this is improving).  # Delirium: Resolved.  She has difficulty knowing when it's day and night at times (was a bit confused on night prior to d/c per her son).  Difficult to orient her with her problems with vision and hearing.  Continued to follow at discharge.  Delirium precautions.   Continue to monitor.  Procedures: Study Conclusions  - Left ventricle: The cavity size was normal. There was focal basal   hypertrophy. No SAM. Increased LVOT gradients likely due to   hyperdynamic LV. Systolic function was hyperdynamic. The    estimated ejection fraction was in the range of 70% to 75%. Wall   motion was normal; there were no regional wall motion   abnormalities. Doppler parameters are consistent with abnormal   left ventricular relaxation (grade 1 diastolic dysfunction). - No significant valvular abnormality.  Recommendations:  Avoid perioperative hypotension. Recommend maintaining adequate volume status with fluids +/- blood transfusion. In case of hypotension, consider IV phenylephrine to avoid further LVOT obstruction.   02/02/18 Procedures: Intramedullary nailing of left femur Placement and removal of distal femoral traction pin  Consultations:  Orthopedics  cardiology  Discharge Exam: Vitals:   02/05/18 1518 02/05/18 2037  BP: (!) 123/46 (!) 166/59  Pulse: 73 77  Resp: 12 18  Temp:  98.1 F (36.7 C)  SpO2: 99% 100%   Son helped me communicate. She was a bit confused last night, didn't know it was night time. Doing better now.  Pain is ok.  General: No acute distress. Cardiovascular: Heart sounds show a regular rate, and rhythm. No gallops or rubs. No murmurs. No JVD. Lungs: Clear to auscultation bilaterally with good air movement. No rales, rhonchi or wheezes. Abdomen: Soft, nontender, nondistended with normal active bowel  sounds. No masses. No hepatosplenomegaly. Neurological: Alert (unable to assess orientation). Moves all extremities 4. Cranial nerves II through XII grossly intact. Skin: Warm and dry. No rashes or lesions. Extremities: No clubbing or cyanosis. No edema.  LLE with intact sutures Discharge Instructions   Discharge Instructions    Call MD for:  difficulty breathing, headache or visual disturbances   Complete by:  As directed    Call MD for:  extreme fatigue   Complete by:  As directed    Call MD for:  persistant dizziness or light-headedness   Complete by:  As directed    Call MD for:  persistant nausea and vomiting   Complete by:  As directed    Call MD for:   severe uncontrolled pain   Complete by:  As directed    Call MD for:  temperature >100.4   Complete by:  As directed    Diet - low sodium heart healthy   Complete by:  As directed    Discharge instructions   Complete by:  As directed    You were seen for a femur fracture.  This was repaired in the OR.    Your troponin was elevated (heart enzyme), which was likely due to the stress of the fall, but we've started you on aspirin and atorvastatin and metoprolol.  You should follow up with cardiology as an outpatient.  Please follow up with orthopedics as scheduled.  You'll take lovenox to help prevent blood clots for the next couple of weeks.    Please follow up with your PCP in a few days.   Return if you have new, recurrent, or worsening symptoms.    Please ask your PCP to request records from this hospitalization so they know what was done and what the next steps will be.   Increase activity slowly   Complete by:  As directed      Allergies as of 02/06/2018   No Known Allergies     Medication List    TAKE these medications   amLODipine 5 MG tablet Commonly known as:  NORVASC Take 1 tablet (5 mg total) by mouth daily. Start taking on:  02/07/2018   aspirin 81 MG chewable tablet Chew 1 tablet (81 mg total) by mouth daily. Start taking on:  02/07/2018   atorvastatin 80 MG tablet Commonly known as:  LIPITOR Take 1 tablet (80 mg total) by mouth daily at 6 PM.   CENTRUM SILVER 50+WOMEN Tabs Take 1 tablet by mouth daily.   enoxaparin 40 MG/0.4ML injection Commonly known as:  LOVENOX Inject 0.4 mLs (40 mg total) into the skin daily.   hydrocortisone 20 MG tablet Commonly known as:  CORTEF Take 10 mg by mouth daily.   levothyroxine 75 MCG tablet Commonly known as:  SYNTHROID, LEVOTHROID Take 75 mcg by mouth daily.   metFORMIN 500 MG tablet Commonly known as:  GLUCOPHAGE Take 500 mg by mouth 2 (two) times daily with a meal.   metoprolol tartrate 25 MG  tablet Commonly known as:  LOPRESSOR Take 0.5 tablets (12.5 mg total) by mouth 2 (two) times daily.   polyethylene glycol packet Commonly known as:  MIRALAX / GLYCOLAX Take 17 g by mouth 2 (two) times daily. (until having soft bowel movement daily, then use as needed or daily)   prednisoLONE acetate 1 % ophthalmic suspension Commonly known as:  PRED FORTE Place 1 drop into both eyes 2 (two) times daily.   senna-docusate 8.6-50 MG tablet Commonly known as:  Senokot-S Take 1 tablet by mouth at bedtime as needed for mild constipation.   sodium chloride 2 % ophthalmic solution Commonly known as:  MURO 128 Place 1 drop into both eyes 2 (two) times daily.   traMADol 50 MG tablet Commonly known as:  ULTRAM Take 1 tablet (50 mg total) by mouth every 6 (six) hours as needed for severe pain.      No Known Allergies  Contact information for follow-up providers    Haddix, Gillie Manners, MD. Schedule an appointment as soon as possible for a visit in 2 week(s).   Specialty:  Orthopedic Surgery Contact information: [address] [place] [address] [phone]        Elder Negus, MD Follow up.   Specialty:  Cardiology Why:  Please call for a follow up appointment  Contact information: [address] [place] [address] [phone]        Billee Cashing, MD Follow up.   Specialty:  Family Medicine Why:  Please call for a follow up apppointment Contact information: [address] [place] [address] [phone]            Contact information for after-discharge care    Destination    [facility] .   Service:  Skilled Nursing Contact information: [address] [place] [address] [phone]                   The results of significant diagnostics from this hospitalization (including imaging, microbiology, ancillary and laboratory) are listed below for reference.    Significant  Diagnostic Studies: Ct Head Wo Contrast  Result Date: 02/02/2018 CLINICAL DATA:  Headache after fall.  History of diabetes. EXAM: CT HEAD WITHOUT CONTRAST CT CERVICAL SPINE WITHOUT CONTRAST TECHNIQUE: Multidetector CT imaging of the head and cervical spine was performed following the standard protocol without intravenous contrast. Multiplanar CT image reconstructions of the cervical spine were also generated. COMPARISON:  MRI of the head May 21, 2004 and CT HEAD May 17, 2004 FINDINGS: CT HEAD FINDINGS BRAIN: No intraparenchymal hemorrhage, mass effect nor midline shift. The ventricles and sulci are normal for age cavum septum pellucidum et vergae. Patchy supratentorial white matter hypodensities less than expected for patient's age, though non-specific are most compatible with chronic small vessel ischemic disease. No acute large vascular territory infarcts. No abnormal extra-axial fluid collections. Basal cisterns are patent. VASCULAR: Moderate calcific atherosclerosis of the carotid siphons. SKULL: No skull fracture. Moderate RIGHT temporomandibular osteoarthrosis. Stable RIGHT frontal calvarial probable hemangioma. No significant scalp soft tissue swelling. SINUSES/ORBITS: The mastoid air-cells and included paranasal sinuses are well-aerated.The included ocular globes and orbital contents are non-suspicious. Status post bilateral ocular lens implants. OTHER: None. CT CERVICAL SPINE FINDINGS ALIGNMENT: Straightened lordosis.  Vertebral bodies in alignment. SKULL BASE AND VERTEBRAE: Cervical vertebral bodies and posterior elements are intact. Mild C3-4, C5-6 and C6-7 disc height loss with endplate spurring consistent with degenerative discs. No destructive bony lesions. C1-2 articulation maintained, mild arthropathy. SOFT TISSUES AND SPINAL CANAL: Nonacute. Mild calcific atherosclerosis carotid bifurcations. DISC LEVELS: Mild canal stenosis C3-4, C4-5, C5-6. Moderate C3-4, moderate to severe C5-6 neural  foraminal narrowing. UPPER CHEST: Lung apices are clear. OTHER: None. IMPRESSION: CT HEAD: 1. Negative noncontrast CT HEAD for age. CT CERVICAL SPINE: 1. No fracture or malalignment. Electronically Signed   By: Awilda Metro M.D.   On: 02/02/2018 01:00   Ct Cervical Spine Wo Contrast  Result Date:  02/02/2018 CLINICAL DATA:  Headache after fall.  History of diabetes. EXAM: CT HEAD WITHOUT CONTRAST CT CERVICAL SPINE WITHOUT CONTRAST TECHNIQUE: Multidetector CT imaging of the head and cervical spine was performed following the standard protocol without intravenous contrast. Multiplanar CT image reconstructions of the cervical spine were also generated. COMPARISON:  MRI of the head May 21, 2004 and CT HEAD May 17, 2004 FINDINGS: CT HEAD FINDINGS BRAIN: No intraparenchymal hemorrhage, mass effect nor midline shift. The ventricles and sulci are normal for age cavum septum pellucidum et vergae. Patchy supratentorial white matter hypodensities less than expected for patient's age, though non-specific are most compatible with chronic small vessel ischemic disease. No acute large vascular territory infarcts. No abnormal extra-axial fluid collections. Basal cisterns are patent. VASCULAR: Moderate calcific atherosclerosis of the carotid siphons. SKULL: No skull fracture. Moderate RIGHT temporomandibular osteoarthrosis. Stable RIGHT frontal calvarial probable hemangioma. No significant scalp soft tissue swelling. SINUSES/ORBITS: The mastoid air-cells and included paranasal sinuses are well-aerated.The included ocular globes and orbital contents are non-suspicious. Status post bilateral ocular lens implants. OTHER: None. CT CERVICAL SPINE FINDINGS ALIGNMENT: Straightened lordosis.  Vertebral bodies in alignment. SKULL BASE AND VERTEBRAE: Cervical vertebral bodies and posterior elements are intact. Mild C3-4, C5-6 and C6-7 disc height loss with endplate spurring consistent with degenerative discs. No destructive bony  lesions. C1-2 articulation maintained, mild arthropathy. SOFT TISSUES AND SPINAL CANAL: Nonacute. Mild calcific atherosclerosis carotid bifurcations. DISC LEVELS: Mild canal stenosis C3-4, C4-5, C5-6. Moderate C3-4, moderate to severe C5-6 neural foraminal narrowing. UPPER CHEST: Lung apices are clear. OTHER: None. IMPRESSION: CT HEAD: 1. Negative noncontrast CT HEAD for age. CT CERVICAL SPINE: 1. No fracture or malalignment. Electronically Signed   By: Awilda Metro M.D.   On: 02/02/2018 01:00   Dg Chest Portable 1 View  Result Date: 02/01/2018 CLINICAL DATA:  Fall, deformity. EXAM: PORTABLE CHEST 1 VIEW COMPARISON:  Chest radiograph June 25, 2005 FINDINGS: Cardiomediastinal silhouette is normal. Coronary artery calcifications versus stents. Calcified aortic arch. No pleural effusions or focal consolidations. Trachea projects midline and there is no pneumothorax. Soft tissue planes and included osseous structures are non-suspicious. Osteopenia. IMPRESSION: No acute cardiopulmonary process. Aortic Atherosclerosis (ICD10-I70.0). Electronically Signed   By: Awilda Metro M.D.   On: 02/01/2018 22:48   Dg C-arm 1-60 Min  Result Date: 02/02/2018 CLINICAL DATA:  Intramedullary rod fixation of left femur. EXAM: DG C-ARM 61-120 MIN; LEFT FEMUR 2 VIEWS Radiation exposure index: 20.44 mGy. COMPARISON:  Radiographs of February 01, 2018. FINDINGS: Twelve intraoperative fluoroscopic images were obtained of the left femur. These demonstrate intramedullary rod fixation of comminuted proximal left femoral shaft fracture. Good alignment of fracture components is noted. IMPRESSION: Status post intramedullary rod fixation of comminuted proximal left femoral shaft fracture. Electronically Signed   By: Lupita Raider, M.D.   On: 02/02/2018 17:53   Dg C-arm 1-60 Min  Result Date: 02/02/2018 CLINICAL DATA:  Intramedullary rod fixation of left femur. EXAM: DG C-ARM 61-120 MIN; LEFT FEMUR 2 VIEWS Radiation exposure  index: 20.44 mGy. COMPARISON:  Radiographs of February 01, 2018. FINDINGS: Twelve intraoperative fluoroscopic images were obtained of the left femur. These demonstrate intramedullary rod fixation of comminuted proximal left femoral shaft fracture. Good alignment of fracture components is noted. IMPRESSION: Status post intramedullary rod fixation of comminuted proximal left femoral shaft fracture. Electronically Signed   By: Lupita Raider, M.D.   On: 02/02/2018 17:53   Dg Femur Min 2 Views Left  Result Date: 02/02/2018 CLINICAL DATA:  Intramedullary  rod fixation of left femur. EXAM: DG C-ARM 61-120 MIN; LEFT FEMUR 2 VIEWS Radiation exposure index: 20.44 mGy. COMPARISON:  Radiographs of February 01, 2018. FINDINGS: Twelve intraoperative fluoroscopic images were obtained of the left femur. These demonstrate intramedullary rod fixation of comminuted proximal left femoral shaft fracture. Good alignment of fracture components is noted. IMPRESSION: Status post intramedullary rod fixation of comminuted proximal left femoral shaft fracture. Electronically Signed   By: Lupita Raider, M.D.   On: 02/02/2018 17:53   Dg Femur Port Min 2 Views Left  Result Date: 02/02/2018 CLINICAL DATA:  Postop EXAM: LEFT FEMUR PORTABLE 2 VIEWS COMPARISON:  02/01/2018 FINDINGS: Interval intramedullary rod and screw fixation of the left femur for comminuted fracture involving the proximal shaft of the femur. Decreased angulation and displacement at the fracture site. Small soft tissue gas consistent with recent operative status. IMPRESSION: Interval intramedullary rod and screw fixation of left femur for comminuted fracture of the proximal femoral shaft with decreased displacement and angulation compared to prior. Expected postsurgical changes. Electronically Signed   By: Jasmine Pang M.D.   On: 02/02/2018 19:11   Dg Femur Portable Min 2 Views Left  Result Date: 02/01/2018 CLINICAL DATA:  72 y/o  F; fall. EXAM: LEFT FEMUR PORTABLE 2  VIEWS COMPARISON:  None. FINDINGS: Three part comminuted acute fracture of the proximal left femur diaphysis with 1 shaft's with anterior and lateral displacement of the major proximal shaft component from distal. Mild osteoarthrosis of the left hip joint with periarticular osteophytosis. Left knee joint is well maintained. Bones are demineralized. Vascular calcifications. IMPRESSION: Comminuted displaced acute fracture of proximal left femur. Electronically Signed   By: Mitzi Hansen M.D.   On: 02/01/2018 22:49   US Abdomen Limited Ruq  Result Date: 02/03/2018 CLINICAL DATA:  Elevated LFTs EXAM: ULTRASOUND ABDOMEN LIMITED RIGHT UPPER QUADRANT COMPARISON:  None. FINDINGS: Gallbladder: No gallstones or wall thickening visualized. No sonographic Murphy sign noted by sonographer. Common bile duct: Diameter: 3.2 mm Liver: No focal lesion identified. Within normal limits in parenchymal echogenicity. Portal vein is patent on color Doppler imaging with normal direction of blood flow towards the liver. IMPRESSION: Negative right upper quadrant abdominal ultrasound Electronically Signed   By: Jasmine Pang M.D.   On: 02/03/2018 21:40    Microbiology: No results found for this or any previous visit (from the past 240 hour(s)).   Labs: Basic Metabolic Panel: Recent Labs  Lab 02/02/18 0328 02/03/18 1610 02/04/18 0643 02/05/18 0546 02/06/18 0628  NA 140 139 140 140 141  K 3.7 5.0 4.1 4.0 4.5  CL 107 110 107 108 107  CO2 24 20* GLUCOSE 113* 171* 138* 103* 89  BUN CREATININE 1.12* 1.21* 1.13* 0.99 0.98  CALCIUM 8.7* 8.2* 8.8* 8.6* 8.6*  MG 2.6* 2.2 2.3 2.3 2.3   Liver Function Tests: Recent Labs  Lab 02/02/18 0328 02/03/18 0633 02/04/18 0643 02/05/18 0546 02/06/18 0628  AST 24 172* 80* 54* 38  ALT 10* 103* 66* 42 31  ALKPHOS 44 47 52 44 42  BILITOT 0.7 1.6* 0.9 1.3* 1.0  PROT 5.1* 5.4* 5.6* 5.3* 4.9*  ALBUMIN 3.0* 3.0* 2.9* 2.7* 2.4*   No results for  input(s): LIPASE, AMYLASE in the last 168 hours. No results for input(s): AMMONIA in the last 168 hours. CBC: Recent Labs  Lab 02/02/18 2204 02/03/18 9604  02/04/18 0643 02/05/18 0546 02/06/18 0628 02/06/18 1052 02/06/18 1335  WBC 11.8* 11.7*  --  12.2* 9.8 7.6  --   --   HGB 16.3* 15.0   < > 13.5 13.1 11.0* 10.8* 11.7*  HCT 47.6* 43.3   < > 39.6 37.9 33.6* 33.1* 34.9*  MCV 83.7 81.9  --  81.6 83.8 85.3  --   --   PLT 85* 99*  --  105* 120* 141*  --   --    < > = values in this interval not displayed.   Cardiac Enzymes: Recent Labs  Lab 02/01/18 2259 02/02/18 0015 02/02/18 0328 02/02/18 0956 02/02/18 1805  CKTOTAL 98  --   --   --   --   TROPONINI  --  0.29* 0.92* 0.51* 0.24*   BNP: BNP (last 3 results) No results for input(s): BNP in the last 8760 hours.  ProBNP (last 3 results) No results for input(s): PROBNP in the last 8760 hours.  CBG: Recent Labs  Lab 02/05/18 1234 02/05/18 1633 02/05/18 2040 02/06/18 0716 02/06/18 1223  GLUCAP 104* 125* 135* 84 122*       Signed:  Lacretia Nicks MD.  [facility] 02/06/2018, 3:04 PM

## 2018-02-06 NOTE — Clinical Social Work Placement (Signed)
   CLINICAL SOCIAL WORK PLACEMENT  NOTE  Date:  02/06/2018  Patient Details  Name: [name] MRN: [id number] Date of Birth: 1946/06/13  Clinical Social Work is seeking post-discharge placement for this patient at the Skilled  Nursing Facility level of care (*CSW will initial, date and re-position this form in  chart as items are completed):  Yes   Patient/family provided with [facility] Clinical Social Work Department's list of facilities offering this level of care within the geographic area requested by the patient (or if unable, by the patient's family).  Yes   Patient/family informed of their freedom to choose among providers that offer the needed level of care, that participate in Medicare, Medicaid or managed care program needed by the patient, have an available bed and are willing to accept the patient.  Yes   Patient/family informed of [facility]'s ownership interest in [facility] and [facility], as well as of the fact that they are under no obligation to receive care at these facilities.  PASRR submitted to EDS on       PASRR number received on       Existing PASRR number confirmed on       FL2 transmitted to all facilities in geographic area requested by pt/family on       FL2 transmitted to all facilities within larger geographic area on       Patient informed that his/her managed care company has contracts with or will negotiate with certain facilities, including the following:        Yes   Patient/family informed of bed offers received.  Patient chooses bed at [facility]     Physician recommends and patient chooses bed at      Patient to be transferred to [facility] on 02/06/18.  Patient to be transferred to facility by [facility]     Patient family notified on 02/06/18 of transfer.  Name of family member notified:  family at bedside, son      PHYSICIAN       Additional Comment:     _______________________________________________ Tresa Moore, LCSW 02/06/2018, 3:45 PM

## 2018-02-06 NOTE — Progress Notes (Signed)
Removed IV, family at bedside, all questions and concerns addressed, called facility and gave report to Miranda, Pt to discharge to [facility] with belongings via [facility].

## 2018-02-07 DIAGNOSIS — R031 Nonspecific low blood-pressure reading: Secondary | ICD-10-CM | POA: Diagnosis not present

## 2018-02-07 DIAGNOSIS — R5383 Other fatigue: Secondary | ICD-10-CM | POA: Diagnosis not present

## 2018-02-07 DIAGNOSIS — S7292XS Unspecified fracture of left femur, sequela: Secondary | ICD-10-CM | POA: Diagnosis not present

## 2018-02-10 DIAGNOSIS — S7292XS Unspecified fracture of left femur, sequela: Secondary | ICD-10-CM | POA: Diagnosis not present

## 2018-02-10 DIAGNOSIS — R5383 Other fatigue: Secondary | ICD-10-CM | POA: Diagnosis not present

## 2018-02-10 DIAGNOSIS — R4182 Altered mental status, unspecified: Secondary | ICD-10-CM | POA: Diagnosis not present

## 2018-02-10 DIAGNOSIS — M6281 Muscle weakness (generalized): Secondary | ICD-10-CM | POA: Diagnosis not present

## 2018-02-10 DIAGNOSIS — E119 Type 2 diabetes mellitus without complications: Secondary | ICD-10-CM | POA: Diagnosis not present

## 2018-02-13 DIAGNOSIS — R5383 Other fatigue: Secondary | ICD-10-CM | POA: Diagnosis not present

## 2018-02-13 DIAGNOSIS — R112 Nausea with vomiting, unspecified: Secondary | ICD-10-CM | POA: Diagnosis not present

## 2018-02-13 DIAGNOSIS — M6281 Muscle weakness (generalized): Secondary | ICD-10-CM | POA: Diagnosis not present

## 2018-02-13 DIAGNOSIS — E2740 Unspecified adrenocortical insufficiency: Secondary | ICD-10-CM | POA: Diagnosis not present

## 2018-02-14 DIAGNOSIS — E2740 Unspecified adrenocortical insufficiency: Secondary | ICD-10-CM | POA: Diagnosis not present

## 2018-02-14 DIAGNOSIS — R5383 Other fatigue: Secondary | ICD-10-CM | POA: Diagnosis not present

## 2018-02-14 DIAGNOSIS — E871 Hypo-osmolality and hyponatremia: Secondary | ICD-10-CM | POA: Diagnosis not present

## 2018-02-14 DIAGNOSIS — R112 Nausea with vomiting, unspecified: Secondary | ICD-10-CM | POA: Diagnosis not present

## 2018-02-16 DIAGNOSIS — E2740 Unspecified adrenocortical insufficiency: Secondary | ICD-10-CM | POA: Diagnosis not present

## 2018-02-16 DIAGNOSIS — E871 Hypo-osmolality and hyponatremia: Secondary | ICD-10-CM | POA: Diagnosis not present

## 2018-02-16 DIAGNOSIS — M6281 Muscle weakness (generalized): Secondary | ICD-10-CM | POA: Diagnosis not present

## 2018-02-16 DIAGNOSIS — D649 Anemia, unspecified: Secondary | ICD-10-CM | POA: Diagnosis not present

## 2018-02-24 DIAGNOSIS — M6281 Muscle weakness (generalized): Secondary | ICD-10-CM | POA: Diagnosis not present

## 2018-02-24 DIAGNOSIS — E2740 Unspecified adrenocortical insufficiency: Secondary | ICD-10-CM | POA: Diagnosis not present

## 2018-02-24 DIAGNOSIS — E118 Type 2 diabetes mellitus with unspecified complications: Secondary | ICD-10-CM | POA: Diagnosis not present

## 2018-02-24 DIAGNOSIS — S7292XS Unspecified fracture of left femur, sequela: Secondary | ICD-10-CM | POA: Diagnosis not present

## 2018-02-27 DIAGNOSIS — I700 Atherosclerosis of aorta: Secondary | ICD-10-CM | POA: Diagnosis not present

## 2018-02-27 DIAGNOSIS — E119 Type 2 diabetes mellitus without complications: Secondary | ICD-10-CM | POA: Diagnosis not present

## 2018-02-27 DIAGNOSIS — H9190 Unspecified hearing loss, unspecified ear: Secondary | ICD-10-CM | POA: Diagnosis not present

## 2018-02-27 DIAGNOSIS — H548 Legal blindness, as defined in USA: Secondary | ICD-10-CM | POA: Diagnosis not present

## 2018-02-27 DIAGNOSIS — S72352D Displaced comminuted fracture of shaft of left femur, subsequent encounter for closed fracture with routine healing: Secondary | ICD-10-CM | POA: Diagnosis not present

## 2018-02-27 DIAGNOSIS — I10 Essential (primary) hypertension: Secondary | ICD-10-CM | POA: Diagnosis not present

## 2018-02-28 DIAGNOSIS — S72352D Displaced comminuted fracture of shaft of left femur, subsequent encounter for closed fracture with routine healing: Secondary | ICD-10-CM | POA: Diagnosis not present

## 2018-02-28 DIAGNOSIS — E119 Type 2 diabetes mellitus without complications: Secondary | ICD-10-CM | POA: Diagnosis not present

## 2018-02-28 DIAGNOSIS — I10 Essential (primary) hypertension: Secondary | ICD-10-CM | POA: Diagnosis not present

## 2018-02-28 DIAGNOSIS — H548 Legal blindness, as defined in USA: Secondary | ICD-10-CM | POA: Diagnosis not present

## 2018-02-28 DIAGNOSIS — H9190 Unspecified hearing loss, unspecified ear: Secondary | ICD-10-CM | POA: Diagnosis not present

## 2018-02-28 DIAGNOSIS — I700 Atherosclerosis of aorta: Secondary | ICD-10-CM | POA: Diagnosis not present

## 2018-03-02 DIAGNOSIS — I10 Essential (primary) hypertension: Secondary | ICD-10-CM | POA: Diagnosis not present

## 2018-03-02 DIAGNOSIS — S72352D Displaced comminuted fracture of shaft of left femur, subsequent encounter for closed fracture with routine healing: Secondary | ICD-10-CM | POA: Diagnosis not present

## 2018-03-02 DIAGNOSIS — E119 Type 2 diabetes mellitus without complications: Secondary | ICD-10-CM | POA: Diagnosis not present

## 2018-03-02 DIAGNOSIS — H548 Legal blindness, as defined in USA: Secondary | ICD-10-CM | POA: Diagnosis not present

## 2018-03-02 DIAGNOSIS — H9190 Unspecified hearing loss, unspecified ear: Secondary | ICD-10-CM | POA: Diagnosis not present

## 2018-03-02 DIAGNOSIS — I700 Atherosclerosis of aorta: Secondary | ICD-10-CM | POA: Diagnosis not present

## 2018-03-03 DIAGNOSIS — I700 Atherosclerosis of aorta: Secondary | ICD-10-CM | POA: Diagnosis not present

## 2018-03-03 DIAGNOSIS — S72352D Displaced comminuted fracture of shaft of left femur, subsequent encounter for closed fracture with routine healing: Secondary | ICD-10-CM | POA: Diagnosis not present

## 2018-03-03 DIAGNOSIS — I10 Essential (primary) hypertension: Secondary | ICD-10-CM | POA: Diagnosis not present

## 2018-03-03 DIAGNOSIS — H9190 Unspecified hearing loss, unspecified ear: Secondary | ICD-10-CM | POA: Diagnosis not present

## 2018-03-03 DIAGNOSIS — E119 Type 2 diabetes mellitus without complications: Secondary | ICD-10-CM | POA: Diagnosis not present

## 2018-03-03 DIAGNOSIS — H548 Legal blindness, as defined in USA: Secondary | ICD-10-CM | POA: Diagnosis not present

## 2018-03-08 DIAGNOSIS — H9190 Unspecified hearing loss, unspecified ear: Secondary | ICD-10-CM | POA: Diagnosis not present

## 2018-03-08 DIAGNOSIS — H548 Legal blindness, as defined in USA: Secondary | ICD-10-CM | POA: Diagnosis not present

## 2018-03-08 DIAGNOSIS — S72352D Displaced comminuted fracture of shaft of left femur, subsequent encounter for closed fracture with routine healing: Secondary | ICD-10-CM | POA: Diagnosis not present

## 2018-03-08 DIAGNOSIS — I10 Essential (primary) hypertension: Secondary | ICD-10-CM | POA: Diagnosis not present

## 2018-03-08 DIAGNOSIS — I700 Atherosclerosis of aorta: Secondary | ICD-10-CM | POA: Diagnosis not present

## 2018-03-08 DIAGNOSIS — E119 Type 2 diabetes mellitus without complications: Secondary | ICD-10-CM | POA: Diagnosis not present

## 2018-03-09 ENCOUNTER — Encounter: Payer: Self-pay | Admitting: Family Medicine

## 2018-03-09 ENCOUNTER — Other Ambulatory Visit: Payer: Self-pay

## 2018-03-09 ENCOUNTER — Ambulatory Visit (INDEPENDENT_AMBULATORY_CARE_PROVIDER_SITE_OTHER): Payer: Medicare Other | Admitting: Family Medicine

## 2018-03-09 VITALS — BP 100/60 | HR 82 | Temp 98.4°F | Resp 17 | Ht 63.0 in | Wt 119.0 lb

## 2018-03-09 DIAGNOSIS — D539 Nutritional anemia, unspecified: Secondary | ICD-10-CM

## 2018-03-09 DIAGNOSIS — S7292XS Unspecified fracture of left femur, sequela: Secondary | ICD-10-CM

## 2018-03-09 DIAGNOSIS — E876 Hypokalemia: Secondary | ICD-10-CM

## 2018-03-09 DIAGNOSIS — E039 Hypothyroidism, unspecified: Secondary | ICD-10-CM

## 2018-03-09 DIAGNOSIS — E118 Type 2 diabetes mellitus with unspecified complications: Secondary | ICD-10-CM | POA: Diagnosis not present

## 2018-03-09 LAB — POCT CBC
GRANULOCYTE PERCENT: 76.4 % (ref 37–80)
HCT, POC: 34.8 % — AB (ref 37.7–47.9)
Hemoglobin: 11.3 g/dL — AB (ref 12.2–16.2)
Lymph, poc: 1.3 (ref 0.6–3.4)
MCH: 27.1 pg (ref 27–31.2)
MCHC: 32.4 g/dL (ref 31.8–35.4)
MCV: 83.5 fL (ref 80–97)
MID (cbc): 0.3 (ref 0–0.9)
MPV: 7 fL (ref 0–99.8)
POC Granulocyte: 5.3 (ref 2–6.9)
POC LYMPH %: 19 % (ref 10–50)
POC MID %: 4.6 % (ref 0–12)
Platelet Count, POC: 261 10*3/uL (ref 142–424)
RBC: 4.17 M/uL (ref 4.04–5.48)
RDW, POC: 16.6 %
WBC: 7 10*3/uL (ref 4.6–10.2)

## 2018-03-09 LAB — POCT GLYCOSYLATED HEMOGLOBIN (HGB A1C): Hemoglobin A1C: 5.5 % (ref 4.0–5.6)

## 2018-03-09 NOTE — Patient Instructions (Addendum)
1. Stop lovenox after today  2. Stop amlodopine since her blood pressure is low 3. Stop the metformin because your sugar is very normal  4. Return to clinic in 2 months for blood glucose follow up     IF you received an x-ray today, you will receive an invoice from [facility]. Please contact [facility] at [phone] with questions or concerns regarding your invoice.   IF you received labwork today, you will receive an invoice from [facility]. Please contact [facility] at [phone] with questions or concerns regarding your invoice.   Our billing staff will not be able to assist you with questions regarding bills from these companies.  You will be contacted with the lab results as soon as they are available. The fastest way to get your results is to activate your My Chart account. Instructions are located on the last page of this paperwork. If you have not heard from [place] regarding the results in 2 weeks, please contact this office.

## 2018-03-09 NOTE — Progress Notes (Signed)
Chief Complaint  Patient presents with  . New Patient (Initial Visit)    Establish care.  Needs sutures checked in knee and back    HPI She is here with her Daughter-in-LAW [name]  Left femur fracture after falling at home.  She required surgery for internal fixation with intramedullary nailing of left femur. Placement and removal of distal femoral traction pin.  She is on lovenox currently. She is taking it once a day with the last dose today.  She is here to establish care and also here for suture removal.   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 She does not know sign language.  She also has a history of hyperthyroidism, adrenal insufficiency and diabetes.   Lab Results  Component Value Date   HGBA1C 5.5 03/09/2018   Hypokalemia  Per the discharge summary pt was hypokalemic She was repleted in the hospital She denies palpitations, muscle cramps    Past Medical History:  Diagnosis Date  . Hypertension   . Type 2 diabetes mellitus (HCC)     Current Outpatient Medications  Medication Sig Dispense Refill  . hydrocortisone (CORTEF) 20 MG tablet Take 10 mg by mouth daily.   11  . levothyroxine (SYNTHROID, LEVOTHROID) 75 MCG tablet Take 75 mcg by mouth daily.  5  . Multiple Vitamins-Minerals (CENTRUM SILVER 50+WOMEN) TABS Take 1 tablet by mouth daily.    Marland Kitchen atorvastatin (LIPITOR) 80 MG tablet Take 1 tablet (80 mg total) by mouth daily at 6 PM. 30 tablet 0  . metoprolol tartrate (LOPRESSOR) 25 MG tablet Take 0.5 tablets (12.5 mg total) by mouth 2 (two) times daily. 30 tablet 0  . polyethylene glycol (MIRALAX / GLYCOLAX) packet Take 17 g by mouth 2 (two) times daily. (until having soft bowel movement daily, then use as needed or daily) (Patient not taking: Reported on 03/09/2018) 14 each 0  . prednisoLONE acetate (PRED FORTE) 1 % ophthalmic suspension Place 1 drop into both eyes 2 (two) times daily.   3  . sodium chloride (MURO 128) 2  % ophthalmic solution Place 1 drop into both eyes 2 (two) times daily.     No current facility-administered medications for this visit.     Allergies: No Known Allergies  Past Surgical History:  Procedure Laterality Date  . EYE SURGERY    . FEMUR IM NAIL Left 02/02/2018  . FEMUR IM NAIL Left 02/02/2018   Procedure: INTRAMEDULLARY (IM) NAIL LEFT FEMUR;  Surgeon: Roby Lofts, MD;  Location: [facility] OR;  Service: Orthopedics;  Laterality: Left;    Social History   Socioeconomic History  . Marital status: Divorced    Spouse name: Not on file  . Number of children: Not on file  . Years of education: Not on file  . Highest education level: Not on file  Occupational History  . Not on file  Social Needs  . Financial resource strain: Not on file  . Food insecurity:    Worry: Not on file    Inability: Not on file  . Transportation needs:    Medical: Not on file    Non-medical: Not on file  Tobacco Use  . Smoking status: Former Games developer  . Smokeless tobacco: Never Used  Substance and Sexual Activity  . Alcohol use: Never    Frequency: Never  . Drug use: Never  . Sexual activity: Not on file  Lifestyle  . Physical activity:    Days per week: Not on file  Minutes per session: Not on file  . Stress: Not on file  Relationships  . Social connections:    Talks on phone: Not on file    Gets together: Not on file    Attends religious service: Not on file    Active member of club or organization: Not on file    Attends meetings of clubs or organizations: Not on file    Relationship status: Not on file  Other Topics Concern  . Not on file  Social History Narrative  . Not on file    Family History  Problem Relation Age of Onset  . Vitiligo Son      ROS Review of Systems See HPI Constitution: No fevers or chills No malaise No diaphoresis Skin: No rash or itching Eyes: no blurry vision, no double vision GU: no dysuria or hematuria Neuro: no dizziness or headaches *  all others reviewed and negative   Objective: Vitals:   03/09/18 1400  BP: 100/60  Pulse: 82  Resp: 17  Temp: 98.4 F (36.9 C)  TempSrc: Oral  SpO2: 97%  Weight: 119 lb (54 kg)  Height:  (1.6 m)   Diabetic Foot Exam - Simple   Simple Foot Form Visual Inspection No deformities, no ulcerations, no other skin breakdown bilaterally:  Yes Sensation Testing Intact to touch and monofilament testing bilaterally:  Yes Pulse Check Posterior Tibialis and Dorsalis pulse intact bilaterally:  Yes Comments      Physical Exam  Constitutional: She is oriented to person, place, and time. She appears well-developed and well-nourished.  HENT:  Head: Normocephalic and atraumatic.  Eyes: Conjunctivae and EOM are normal.  Cardiovascular: Normal rate, regular rhythm and normal heart sounds.  No murmur heard. Pulmonary/Chest: Effort normal and breath sounds normal. No stridor. No respiratory distress. She has no wheezes.  Musculoskeletal:  Removed 8 sutures from the left lower extremity. Incisions well healed.   Neurological: She is alert and oriented to person, place, and time.  Skin: Skin is warm. Capillary refill takes less than 2 seconds.  Psychiatric: She has a normal mood and affect. Her behavior is normal. Judgment and thought content normal.    Assessment and Plan [name] was seen today for new patient (initial visit).  Diagnoses and all orders for this visit:  Type 2 diabetes mellitus with complication, without long-term current use of insulin (HCC)- diabetes well controlled Will check monitoring labs -     POCT glycosylated hemoglobin (Hb A1C) -     HM Diabetes Foot Exam -     Comprehensive metabolic panel  Deficiency anemia- postop anemia  -     POCT CBC  Acquired hypothyroidism-  Thyroid levels within range -     TSH + free T4  Hypokalemia- checked today Levels in normal range  Closed fracture of left femur, unspecified fracture morphology, unspecified portion  of femur, sequela -  Advised continue stretching Removed stitches     Duvall Comes A Creta Levin

## 2018-03-10 DIAGNOSIS — S72352D Displaced comminuted fracture of shaft of left femur, subsequent encounter for closed fracture with routine healing: Secondary | ICD-10-CM | POA: Diagnosis not present

## 2018-03-10 DIAGNOSIS — H9190 Unspecified hearing loss, unspecified ear: Secondary | ICD-10-CM | POA: Diagnosis not present

## 2018-03-10 DIAGNOSIS — I10 Essential (primary) hypertension: Secondary | ICD-10-CM | POA: Diagnosis not present

## 2018-03-10 DIAGNOSIS — H548 Legal blindness, as defined in USA: Secondary | ICD-10-CM | POA: Diagnosis not present

## 2018-03-10 DIAGNOSIS — I700 Atherosclerosis of aorta: Secondary | ICD-10-CM | POA: Diagnosis not present

## 2018-03-10 DIAGNOSIS — E119 Type 2 diabetes mellitus without complications: Secondary | ICD-10-CM | POA: Diagnosis not present

## 2018-03-10 LAB — COMPREHENSIVE METABOLIC PANEL
ALBUMIN: 3.5 g/dL (ref 3.5–4.8)
ALT: 14 IU/L (ref 0–32)
AST: 23 IU/L (ref 0–40)
Albumin/Globulin Ratio: 1.5 (ref 1.2–2.2)
Alkaline Phosphatase: 150 IU/L — ABNORMAL HIGH (ref 39–117)
BUN / CREAT RATIO: 18 (ref 12–28)
BUN: 19 mg/dL (ref 8–27)
Bilirubin Total: 0.5 mg/dL (ref 0.0–1.2)
CALCIUM: 9.2 mg/dL (ref 8.7–10.3)
CO2: 24 mmol/L (ref 20–29)
CREATININE: 1.08 mg/dL — AB (ref 0.57–1.00)
Chloride: 107 mmol/L — ABNORMAL HIGH (ref 96–106)
GFR calc Af Amer: 59 mL/min/{1.73_m2} — ABNORMAL LOW (ref >59)
GFR, EST NON AFRICAN AMERICAN: 51 mL/min/{1.73_m2} — AB (ref >59)
GLOBULIN, TOTAL: 2.3 g/dL (ref 1.5–4.5)
Glucose: 98 mg/dL (ref 65–99)
Potassium: 4.1 mmol/L (ref 3.5–5.2)
SODIUM: 144 mmol/L (ref 134–144)
Total Protein: 5.8 g/dL — ABNORMAL LOW (ref 6.0–8.5)

## 2018-03-10 LAB — TSH+FREE T4
FREE T4: 1.22 ng/dL (ref 0.82–1.77)
TSH: <0.006 u[IU]/mL — ABNORMAL LOW (ref 0.450–4.500)

## 2018-03-13 DIAGNOSIS — H9190 Unspecified hearing loss, unspecified ear: Secondary | ICD-10-CM | POA: Diagnosis not present

## 2018-03-13 DIAGNOSIS — I10 Essential (primary) hypertension: Secondary | ICD-10-CM | POA: Diagnosis not present

## 2018-03-13 DIAGNOSIS — S72352D Displaced comminuted fracture of shaft of left femur, subsequent encounter for closed fracture with routine healing: Secondary | ICD-10-CM | POA: Diagnosis not present

## 2018-03-13 DIAGNOSIS — H548 Legal blindness, as defined in USA: Secondary | ICD-10-CM | POA: Diagnosis not present

## 2018-03-13 DIAGNOSIS — S7292XA Unspecified fracture of left femur, initial encounter for closed fracture: Secondary | ICD-10-CM | POA: Diagnosis not present

## 2018-03-13 DIAGNOSIS — I700 Atherosclerosis of aorta: Secondary | ICD-10-CM | POA: Diagnosis not present

## 2018-03-13 DIAGNOSIS — E119 Type 2 diabetes mellitus without complications: Secondary | ICD-10-CM | POA: Diagnosis not present

## 2018-03-15 ENCOUNTER — Encounter: Payer: Self-pay | Admitting: Family Medicine

## 2018-03-15 DIAGNOSIS — I700 Atherosclerosis of aorta: Secondary | ICD-10-CM | POA: Diagnosis not present

## 2018-03-15 DIAGNOSIS — H9190 Unspecified hearing loss, unspecified ear: Secondary | ICD-10-CM | POA: Diagnosis not present

## 2018-03-15 DIAGNOSIS — H548 Legal blindness, as defined in USA: Secondary | ICD-10-CM | POA: Diagnosis not present

## 2018-03-15 DIAGNOSIS — S72352D Displaced comminuted fracture of shaft of left femur, subsequent encounter for closed fracture with routine healing: Secondary | ICD-10-CM | POA: Diagnosis not present

## 2018-03-15 DIAGNOSIS — E119 Type 2 diabetes mellitus without complications: Secondary | ICD-10-CM | POA: Diagnosis not present

## 2018-03-15 DIAGNOSIS — I10 Essential (primary) hypertension: Secondary | ICD-10-CM | POA: Diagnosis not present

## 2018-03-16 DIAGNOSIS — H548 Legal blindness, as defined in USA: Secondary | ICD-10-CM | POA: Diagnosis not present

## 2018-03-16 DIAGNOSIS — I10 Essential (primary) hypertension: Secondary | ICD-10-CM | POA: Diagnosis not present

## 2018-03-16 DIAGNOSIS — H9190 Unspecified hearing loss, unspecified ear: Secondary | ICD-10-CM | POA: Diagnosis not present

## 2018-03-16 DIAGNOSIS — S72352D Displaced comminuted fracture of shaft of left femur, subsequent encounter for closed fracture with routine healing: Secondary | ICD-10-CM | POA: Diagnosis not present

## 2018-03-16 DIAGNOSIS — H16223 Keratoconjunctivitis sicca, not specified as Sjogren's, bilateral: Secondary | ICD-10-CM | POA: Diagnosis not present

## 2018-03-16 DIAGNOSIS — B5801 Toxoplasma chorioretinitis: Secondary | ICD-10-CM | POA: Diagnosis not present

## 2018-03-16 DIAGNOSIS — E119 Type 2 diabetes mellitus without complications: Secondary | ICD-10-CM | POA: Diagnosis not present

## 2018-03-16 DIAGNOSIS — I700 Atherosclerosis of aorta: Secondary | ICD-10-CM | POA: Diagnosis not present

## 2018-03-16 DIAGNOSIS — H541225 Low vision right eye category 2, blindness left eye category 5: Secondary | ICD-10-CM | POA: Diagnosis not present

## 2018-03-17 DIAGNOSIS — E119 Type 2 diabetes mellitus without complications: Secondary | ICD-10-CM | POA: Diagnosis not present

## 2018-03-17 DIAGNOSIS — H548 Legal blindness, as defined in USA: Secondary | ICD-10-CM | POA: Diagnosis not present

## 2018-03-17 DIAGNOSIS — I700 Atherosclerosis of aorta: Secondary | ICD-10-CM | POA: Diagnosis not present

## 2018-03-17 DIAGNOSIS — H9190 Unspecified hearing loss, unspecified ear: Secondary | ICD-10-CM | POA: Diagnosis not present

## 2018-03-17 DIAGNOSIS — S72352D Displaced comminuted fracture of shaft of left femur, subsequent encounter for closed fracture with routine healing: Secondary | ICD-10-CM | POA: Diagnosis not present

## 2018-03-17 DIAGNOSIS — I10 Essential (primary) hypertension: Secondary | ICD-10-CM | POA: Diagnosis not present

## 2018-03-20 DIAGNOSIS — H9190 Unspecified hearing loss, unspecified ear: Secondary | ICD-10-CM | POA: Diagnosis not present

## 2018-03-20 DIAGNOSIS — I700 Atherosclerosis of aorta: Secondary | ICD-10-CM | POA: Diagnosis not present

## 2018-03-20 DIAGNOSIS — I10 Essential (primary) hypertension: Secondary | ICD-10-CM | POA: Diagnosis not present

## 2018-03-20 DIAGNOSIS — E119 Type 2 diabetes mellitus without complications: Secondary | ICD-10-CM | POA: Diagnosis not present

## 2018-03-20 DIAGNOSIS — H548 Legal blindness, as defined in USA: Secondary | ICD-10-CM | POA: Diagnosis not present

## 2018-03-20 DIAGNOSIS — S72352D Displaced comminuted fracture of shaft of left femur, subsequent encounter for closed fracture with routine healing: Secondary | ICD-10-CM | POA: Diagnosis not present

## 2018-03-21 DIAGNOSIS — E119 Type 2 diabetes mellitus without complications: Secondary | ICD-10-CM | POA: Diagnosis not present

## 2018-03-21 DIAGNOSIS — H9190 Unspecified hearing loss, unspecified ear: Secondary | ICD-10-CM | POA: Diagnosis not present

## 2018-03-21 DIAGNOSIS — I10 Essential (primary) hypertension: Secondary | ICD-10-CM | POA: Diagnosis not present

## 2018-03-21 DIAGNOSIS — I700 Atherosclerosis of aorta: Secondary | ICD-10-CM | POA: Diagnosis not present

## 2018-03-21 DIAGNOSIS — S72352D Displaced comminuted fracture of shaft of left femur, subsequent encounter for closed fracture with routine healing: Secondary | ICD-10-CM | POA: Diagnosis not present

## 2018-03-21 DIAGNOSIS — H548 Legal blindness, as defined in USA: Secondary | ICD-10-CM | POA: Diagnosis not present

## 2018-03-24 DIAGNOSIS — H9190 Unspecified hearing loss, unspecified ear: Secondary | ICD-10-CM | POA: Diagnosis not present

## 2018-03-24 DIAGNOSIS — I700 Atherosclerosis of aorta: Secondary | ICD-10-CM | POA: Diagnosis not present

## 2018-03-24 DIAGNOSIS — H548 Legal blindness, as defined in USA: Secondary | ICD-10-CM | POA: Diagnosis not present

## 2018-03-24 DIAGNOSIS — I10 Essential (primary) hypertension: Secondary | ICD-10-CM | POA: Diagnosis not present

## 2018-03-24 DIAGNOSIS — E119 Type 2 diabetes mellitus without complications: Secondary | ICD-10-CM | POA: Diagnosis not present

## 2018-03-24 DIAGNOSIS — S72352D Displaced comminuted fracture of shaft of left femur, subsequent encounter for closed fracture with routine healing: Secondary | ICD-10-CM | POA: Diagnosis not present

## 2018-03-29 DIAGNOSIS — H548 Legal blindness, as defined in USA: Secondary | ICD-10-CM | POA: Diagnosis not present

## 2018-03-29 DIAGNOSIS — S72352D Displaced comminuted fracture of shaft of left femur, subsequent encounter for closed fracture with routine healing: Secondary | ICD-10-CM | POA: Diagnosis not present

## 2018-03-29 DIAGNOSIS — E119 Type 2 diabetes mellitus without complications: Secondary | ICD-10-CM | POA: Diagnosis not present

## 2018-03-29 DIAGNOSIS — I700 Atherosclerosis of aorta: Secondary | ICD-10-CM | POA: Diagnosis not present

## 2018-03-29 DIAGNOSIS — I10 Essential (primary) hypertension: Secondary | ICD-10-CM | POA: Diagnosis not present

## 2018-03-29 DIAGNOSIS — H9190 Unspecified hearing loss, unspecified ear: Secondary | ICD-10-CM | POA: Diagnosis not present

## 2018-03-30 DIAGNOSIS — I10 Essential (primary) hypertension: Secondary | ICD-10-CM | POA: Diagnosis not present

## 2018-03-30 DIAGNOSIS — H548 Legal blindness, as defined in USA: Secondary | ICD-10-CM | POA: Diagnosis not present

## 2018-03-30 DIAGNOSIS — H9190 Unspecified hearing loss, unspecified ear: Secondary | ICD-10-CM | POA: Diagnosis not present

## 2018-03-30 DIAGNOSIS — I700 Atherosclerosis of aorta: Secondary | ICD-10-CM | POA: Diagnosis not present

## 2018-03-30 DIAGNOSIS — E119 Type 2 diabetes mellitus without complications: Secondary | ICD-10-CM | POA: Diagnosis not present

## 2018-03-30 DIAGNOSIS — S72352D Displaced comminuted fracture of shaft of left femur, subsequent encounter for closed fracture with routine healing: Secondary | ICD-10-CM | POA: Diagnosis not present

## 2018-04-04 DIAGNOSIS — I700 Atherosclerosis of aorta: Secondary | ICD-10-CM | POA: Diagnosis not present

## 2018-04-04 DIAGNOSIS — I10 Essential (primary) hypertension: Secondary | ICD-10-CM | POA: Diagnosis not present

## 2018-04-04 DIAGNOSIS — S72352D Displaced comminuted fracture of shaft of left femur, subsequent encounter for closed fracture with routine healing: Secondary | ICD-10-CM | POA: Diagnosis not present

## 2018-04-04 DIAGNOSIS — H9190 Unspecified hearing loss, unspecified ear: Secondary | ICD-10-CM | POA: Diagnosis not present

## 2018-04-04 DIAGNOSIS — E119 Type 2 diabetes mellitus without complications: Secondary | ICD-10-CM | POA: Diagnosis not present

## 2018-04-04 DIAGNOSIS — H548 Legal blindness, as defined in USA: Secondary | ICD-10-CM | POA: Diagnosis not present

## 2018-04-07 DIAGNOSIS — E119 Type 2 diabetes mellitus without complications: Secondary | ICD-10-CM | POA: Diagnosis not present

## 2018-04-07 DIAGNOSIS — H548 Legal blindness, as defined in USA: Secondary | ICD-10-CM | POA: Diagnosis not present

## 2018-04-07 DIAGNOSIS — I700 Atherosclerosis of aorta: Secondary | ICD-10-CM | POA: Diagnosis not present

## 2018-04-07 DIAGNOSIS — I10 Essential (primary) hypertension: Secondary | ICD-10-CM | POA: Diagnosis not present

## 2018-04-07 DIAGNOSIS — S72352D Displaced comminuted fracture of shaft of left femur, subsequent encounter for closed fracture with routine healing: Secondary | ICD-10-CM | POA: Diagnosis not present

## 2018-04-07 DIAGNOSIS — H9190 Unspecified hearing loss, unspecified ear: Secondary | ICD-10-CM | POA: Diagnosis not present

## 2018-04-11 ENCOUNTER — Telehealth: Payer: Self-pay | Admitting: Family Medicine

## 2018-04-11 DIAGNOSIS — D696 Thrombocytopenia, unspecified: Secondary | ICD-10-CM

## 2018-04-11 DIAGNOSIS — D508 Other iron deficiency anemias: Secondary | ICD-10-CM

## 2018-04-11 DIAGNOSIS — E0580 Other thyrotoxicosis without thyrotoxic crisis or storm: Secondary | ICD-10-CM

## 2018-04-11 DIAGNOSIS — E2740 Unspecified adrenocortical insufficiency: Secondary | ICD-10-CM

## 2018-04-11 NOTE — Telephone Encounter (Unsigned)
Copied from CRM [phone]#[id number]. Topic: Quick Communication - Rx Refill/Question >> Apr 11, 2018  1:51 PM Gaynelle AduPoole, Shalonda wrote: Medication: hydrocortisone (CORTEF) 20 MG tablet  levothyroxine (SYNTHROID, LEVOTHROID) 75 MCG tablet prednisoLONE acetate (PRED FORTE) 1 % ophthalmic suspension   Has the patient contacted their pharmacy? no   Preferred Pharmacy (with phone number or street name): [facility] [address] - [place] ([address]), [place] - [address] [phone] (Phone) [phone] (Fax)

## 2018-04-12 NOTE — Telephone Encounter (Signed)
Please advise Prescribed by different provider.

## 2018-04-17 NOTE — Telephone Encounter (Signed)
Pt's son called in to follow up on refill request on medications. Son says that he will reach out to pt's eye Dr to request refill for prednisoLONE acetate (PRED FORTE) 1 % ophthalmic suspension      Pt no longer see provider that prescribed Hydrocortisone and levothyroxine, provider is out of practice.    Pt has been out of medication for a while.     Pharmacy: [facility] [address] - [place] ([address]), [place] - [address]        [phone] (Phone) [phone] (Fax)

## 2018-04-17 NOTE — Telephone Encounter (Signed)
Hydrocortisone 20 MG, Pred Forte 1% ophthalmic suspension and Levothyroxine 75 mcg prescribed by another provider. Please advise, thank you.

## 2018-04-18 MED ORDER — PREDNISOLONE ACETATE 1 % OP SUSP: 1.0000 [drp] | Freq: Two times a day (BID) | OPHTHALMIC | 3 refills | Status: DC

## 2018-04-18 MED ORDER — SODIUM CHLORIDE (HYPERTONIC) 2 % OP SOLN: 1.0000 [drp] | Freq: Two times a day (BID) | OPHTHALMIC | 0 refills | Status: AC

## 2018-04-18 MED ORDER — ATORVASTATIN CALCIUM 80 MG PO TABS: 80.0000 mg | ORAL_TABLET | Freq: Every day | ORAL | 1 refills | Status: DC

## 2018-04-18 MED ORDER — LEVOTHYROXINE SODIUM 75 MCG PO TABS: ORAL_TABLET | ORAL | 0 refills | Status: DC

## 2018-04-18 MED ORDER — HYDROCORTISONE 20 MG PO TABS: 10.0000 mg | ORAL_TABLET | Freq: Every day | ORAL | 0 refills | Status: DC

## 2018-04-18 MED ORDER — METOPROLOL TARTRATE 25 MG PO TABS: 12.5000 mg | ORAL_TABLET | Freq: Two times a day (BID) | ORAL | 0 refills | Status: DC

## 2018-04-18 NOTE — Telephone Encounter (Signed)
Please let the patient or her caregiver know that I have sent in enough meds for a month.  I would like to see her in the office in the next 2-3 weeks to discuss her thyroid level which is abnormal. I have ordered her labs ahead of time so that the office visit is more effective.  She should come in any morning fasting for cholesterol check, and cortisol levels. First thing in the morning is best.

## 2018-04-19 NOTE — Telephone Encounter (Signed)
Spoke to youngest son Terick. He will relay Dr. Creta LevinStallings' message to older brother to call and make appt 2-3 weeks. One week before appt, to bring pt in for labs.   He verbalized understanding.

## 2018-05-01 DIAGNOSIS — S7292XD Unspecified fracture of left femur, subsequent encounter for closed fracture with routine healing: Secondary | ICD-10-CM | POA: Diagnosis not present

## 2018-05-04 ENCOUNTER — Ambulatory Visit: Payer: Medicare Other

## 2018-05-04 DIAGNOSIS — E2740 Unspecified adrenocortical insufficiency: Secondary | ICD-10-CM

## 2018-05-04 DIAGNOSIS — D508 Other iron deficiency anemias: Secondary | ICD-10-CM

## 2018-05-04 DIAGNOSIS — D696 Thrombocytopenia, unspecified: Secondary | ICD-10-CM

## 2018-05-04 DIAGNOSIS — E0580 Other thyrotoxicosis without thyrotoxic crisis or storm: Secondary | ICD-10-CM

## 2018-05-05 LAB — CBC WITH DIFFERENTIAL/PLATELET
BASOS: 1 %
Basophils Absolute: 0.1 10*3/uL (ref 0.0–0.2)
EOS (ABSOLUTE): 0.2 10*3/uL (ref 0.0–0.4)
Eos: 2 %
HEMATOCRIT: 41.1 % (ref 34.0–46.6)
HEMOGLOBIN: 13.3 g/dL (ref 11.1–15.9)
IMMATURE GRANULOCYTES: 0 %
Immature Grans (Abs): 0 10*3/uL (ref 0.0–0.1)
Lymphocytes Absolute: 3.9 10*3/uL — ABNORMAL HIGH (ref 0.7–3.1)
Lymphs: 45 %
MCH: 27.4 pg (ref 26.6–33.0)
MCHC: 32.4 g/dL (ref 31.5–35.7)
MCV: 85 fL (ref 79–97)
MONOCYTES: 6 %
Monocytes Absolute: 0.5 10*3/uL (ref 0.1–0.9)
NEUTROS PCT: 46 %
Neutrophils Absolute: 3.9 10*3/uL (ref 1.4–7.0)
Platelets: 304 10*3/uL (ref 150–450)
RBC: 4.86 x10E6/uL (ref 3.77–5.28)
RDW: 14.4 % (ref 12.3–15.4)
WBC: 8.6 10*3/uL (ref 3.4–10.8)

## 2018-05-05 LAB — LIPID PANEL
Chol/HDL Ratio: 3.3 ratio (ref 0.0–4.4)
Cholesterol, Total: 185 mg/dL (ref 100–199)
HDL: 56 mg/dL (ref >39)
LDL CALC: 112 mg/dL — AB (ref 0–99)
Triglycerides: 85 mg/dL (ref 0–149)
VLDL CHOLESTEROL CAL: 17 mg/dL (ref 5–40)

## 2018-05-05 LAB — CORTISOL: CORTISOL: 0.4 ug/dL

## 2018-05-11 ENCOUNTER — Encounter: Payer: Self-pay | Admitting: Family Medicine

## 2018-05-11 ENCOUNTER — Ambulatory Visit (INDEPENDENT_AMBULATORY_CARE_PROVIDER_SITE_OTHER): Payer: Medicare Other | Admitting: Family Medicine

## 2018-05-11 ENCOUNTER — Other Ambulatory Visit: Payer: Self-pay

## 2018-05-11 VITALS — BP 130/68 | HR 71 | Temp 99.1°F | Resp 17 | Ht 63.0 in | Wt 124.6 lb

## 2018-05-11 DIAGNOSIS — E2740 Unspecified adrenocortical insufficiency: Secondary | ICD-10-CM

## 2018-05-11 DIAGNOSIS — I10 Essential (primary) hypertension: Secondary | ICD-10-CM

## 2018-05-11 DIAGNOSIS — Z5181 Encounter for therapeutic drug level monitoring: Secondary | ICD-10-CM

## 2018-05-11 DIAGNOSIS — E0580 Other thyrotoxicosis without thyrotoxic crisis or storm: Secondary | ICD-10-CM

## 2018-05-11 DIAGNOSIS — E118 Type 2 diabetes mellitus with unspecified complications: Secondary | ICD-10-CM | POA: Diagnosis not present

## 2018-05-11 LAB — POCT GLYCOSYLATED HEMOGLOBIN (HGB A1C): Hemoglobin A1C: 5.6 % (ref 4.0–5.6)

## 2018-05-11 MED ORDER — LEVOTHYROXINE SODIUM 50 MCG PO TABS: 50.0000 ug | ORAL_TABLET | Freq: Every day | ORAL | 1 refills | Status: DC

## 2018-05-11 MED ORDER — HYDROCORTISONE 20 MG PO TABS: 10.0000 mg | ORAL_TABLET | Freq: Every day | ORAL | 0 refills | Status: DC

## 2018-05-11 NOTE — Patient Instructions (Addendum)
1.  Your thyroid medication dose was decreased from 75mcg to 50mcg 2. I would like to check your thyroid levels in 3 months to see that the levels are better. Right now  Your labs show that you are getting too high of a dose. 3. Your blood pressure is very good 4. Your diabetes is very good. Your a1c is 5.6% and last time it was 5.5%. Our goal is to keep it less than 8%    IF you received an x-ray today, you will receive an invoice from [facility]. Please contact [facility] at [phone] with questions or concerns regarding your invoice.   IF you received labwork today, you will receive an invoice from [facility]. Please contact [facility] at [phone] with questions or concerns regarding your invoice.   Our billing staff will not be able to assist you with questions regarding bills from these companies.  You will be contacted with the lab results as soon as they are available. The fastest way to get your results is to activate your My Chart account. Instructions are located on the last page of this paperwork. If you have not heard from us regarding the results in 2 weeks, please contact this office.

## 2018-05-11 NOTE — Progress Notes (Signed)
Chief Complaint  Patient presents with  . Diabetes    2 month f/u    HPI  Adrenal Insufficiency and Diabetes Mellitus type 2 She has a history of adrenal insufficiency and diabetes She was discontinued off metformin at the last visit 03/09/2018 She wasgetting low sugar symptoms  She is very strict with her diet and adheres to a diabetic eating plan She is on hydrocortisone 10mg  daily for adrenal insufficiency Lab Results  Component Value Date   HGBA1C 5.5 03/09/2018   Iatrogenic Hyperthryoidism She has history of hypothyroidism She does not see endocrinology She takes her levothyroxine 75mg  daily She denies fatigue, skin changes or mood swings She gets fluctuations in her weight Lab Results  Component Value Date   TSH <0.006 (L) 03/09/2018   Wt Readings from Last 3 Encounters:  05/11/18 124 lb 9.6 oz (56.5 kg)  03/09/18 119 lb (54 kg)  02/05/18 148 lb 4.8 oz (67.3 kg)   Hypertension: Patient here for follow-up of elevated blood pressure. She is not exercising and is adherent to low salt diet.  Blood pressure is well controlled at home. Cardiac symptoms none. Patient denies chest pain, chest pressure/discomfort, claudication, dyspnea, exertional chest pressure/discomfort and fatigue.  Cardiovascular risk factors: advanced age (older than 37 for men, 49 for women), diabetes mellitus and hypertension. Use of agents associated with hypertension: steroids and thyroid hormones.  At the last visit her metoprolol was decreased to half tablet but she is now off the metoprolol  BP Readings from Last 3 Encounters:  05/11/18 130/68  03/09/18 100/60  02/05/18 (!) 166/59     Past Medical History:  Diagnosis Date  . Hypertension   . Type 2 diabetes mellitus (HCC)     Current Outpatient Medications  Medication Sig Dispense Refill  . hydrocortisone (CORTEF) 20 MG tablet Take 0.5 tablets (10 mg total) by mouth daily. 90 tablet 0  . prednisoLONE acetate (PRED FORTE) 1 % ophthalmic  suspension Place 1 drop into both eyes 2 (two) times daily. 5 mL 3  . sodium chloride (MURO 128) 2 % ophthalmic solution Place 1 drop into both eyes 2 (two) times daily. 15 mL 0  . levothyroxine (SYNTHROID, LEVOTHROID) 50 MCG tablet Take 1 tablet (50 mcg total) by mouth daily. 90 tablet 1  . Multiple Vitamins-Minerals (CENTRUM SILVER 50+WOMEN) TABS Take 1 tablet by mouth daily.    . polyethylene glycol (MIRALAX / GLYCOLAX) packet Take 17 g by mouth 2 (two) times daily. (until having soft bowel movement daily, then use as needed or daily) (Patient not taking: Reported on 03/09/2018) 14 each 0   No current facility-administered medications for this visit.     Allergies: No Known Allergies  Past Surgical History:  Procedure Laterality Date  . EYE SURGERY    . FEMUR IM NAIL Left 02/02/2018  . FEMUR IM NAIL Left 02/02/2018   Procedure: INTRAMEDULLARY (IM) NAIL LEFT FEMUR;  Surgeon: Roby Lofts, MD;  Location: [facility] OR;  Service: Orthopedics;  Laterality: Left;    Social History   Socioeconomic History  . Marital status: Divorced    Spouse name: Not on file  . Number of children: Not on file  . Years of education: Not on file  . Highest education level: Not on file  Occupational History  . Not on file  Social Needs  . Financial resource strain: Not on file  . Food insecurity:    Worry: Not on file    Inability: Not on file  . Transportation  needs:    Medical: Not on file    Non-medical: Not on file  Tobacco Use  . Smoking status: Former Games developermoker  . Smokeless tobacco: Never Used  Substance and Sexual Activity  . Alcohol use: Never    Frequency: Never  . Drug use: Never  . Sexual activity: Not on file  Lifestyle  . Physical activity:    Days per week: Not on file    Minutes per session: Not on file  . Stress: Not on file  Relationships  . Social connections:    Talks on phone: Not on file    Gets together: Not on file    Attends religious service: Not on file    Active  member of club or organization: Not on file    Attends meetings of clubs or organizations: Not on file    Relationship status: Not on file  Other Topics Concern  . Not on file  Social History Narrative  . Not on file    Family History  Problem Relation Age of Onset  . Vitiligo Son      ROS Review of Systems See HPI Constitution: No fevers or chills No malaise No diaphoresis Skin: No rash or itching Eyes: no blurry vision, no double vision GU: no dysuria or hematuria Neuro: no dizziness or headaches  all others reviewed and negative   Objective: Vitals:   05/11/18 1005  BP: 130/68  Pulse: 71  Resp: 17  Temp: 99.1 F (37.3 C)  TempSrc: Oral  SpO2: 100%  Weight: 124 lb 9.6 oz (56.5 kg)  Height: 5\' 3"  (1.6 m)    Physical Exam  Constitutional: She is oriented to person, place, and time. She appears well-developed and well-nourished.  HENT:  Head: Normocephalic and atraumatic.  Neck: Normal range of motion. Neck supple. No thyromegaly present.  Cardiovascular: Normal rate, regular rhythm and normal heart sounds.  No murmur heard. Pulmonary/Chest: Effort normal and breath sounds normal. No stridor. No respiratory distress. She has no wheezes.  Neurological: She is alert and oriented to person, place, and time.  Skin: Skin is warm. Capillary refill takes less than 2 seconds.  Psychiatric: She has a normal mood and affect. Her behavior is normal. Judgment and thought content normal.      Assessment and Plan [name] was seen today for diabetes.  Of note she has multiple endocrine disorders including hypothyroidism, diabetes and adrenal insufficiency.  Diagnoses and all orders for this visit:  Type 2 diabetes mellitus with complication, without long-term current use of insulin (HCC)-  Stable off metformin, continue diabetic diet -     POCT glycosylated hemoglobin (Hb A1C)  Encounter for medication monitoring -     POCT glycosylated hemoglobin (Hb  A1C)  Iatrogenic hyperthyroidism- over corrected. will decrease levothyroxine to 50mcg from 75mcg  Adrenal insufficiency-stable, last cortisol levels in the am were low, continue cortef. Her vitals are stable.  -     hydrocortisone (CORTEF) 20 MG tablet; Take 0.5 tablets (10 mg total) by mouth daily.   Essential hypertension- bp still well controlled off all bp meds   Other orders -     levothyroxine (SYNTHROID, LEVOTHROID) 50 MCG tablet; Take 1 tablet (50 mcg total) by mouth daily.     [name] A [name]

## 2018-08-09 DIAGNOSIS — H541225 Low vision right eye category 2, blindness left eye category 5: Secondary | ICD-10-CM | POA: Diagnosis not present

## 2018-08-09 DIAGNOSIS — E119 Type 2 diabetes mellitus without complications: Secondary | ICD-10-CM | POA: Diagnosis not present

## 2018-08-09 DIAGNOSIS — H16223 Keratoconjunctivitis sicca, not specified as Sjogren's, bilateral: Secondary | ICD-10-CM | POA: Diagnosis not present

## 2018-08-09 DIAGNOSIS — B5801 Toxoplasma chorioretinitis: Secondary | ICD-10-CM | POA: Diagnosis not present

## 2018-08-14 ENCOUNTER — Ambulatory Visit: Payer: Medicare Other | Admitting: Family Medicine

## 2018-08-14 NOTE — Progress Notes (Deleted)
No chief complaint on file.   HPI  4 review of systems  Past Medical History:  Diagnosis Date  . Hypertension   . Type 2 diabetes mellitus (HCC)     Current Outpatient Medications  Medication Sig Dispense Refill  . hydrocortisone (CORTEF) 20 MG tablet Take 0.5 tablets (10 mg total) by mouth daily. 90 tablet 0  . levothyroxine (SYNTHROID, LEVOTHROID) 50 MCG tablet Take 1 tablet (50 mcg total) by mouth daily. 90 tablet 1  . Multiple Vitamins-Minerals (CENTRUM SILVER 50+WOMEN) TABS Take 1 tablet by mouth daily.    . polyethylene glycol (MIRALAX / GLYCOLAX) packet Take 17 g by mouth 2 (two) times daily. (until having soft bowel movement daily, then use as needed or daily) (Patient not taking: Reported on 03/09/2018) 14 each 0  . prednisoLONE acetate (PRED FORTE) 1 % ophthalmic suspension Place 1 drop into both eyes 2 (two) times daily. 5 mL 3  . sodium chloride (MURO 128) 2 % ophthalmic solution Place 1 drop into both eyes 2 (two) times daily. 15 mL 0   No current facility-administered medications for this visit.     Allergies: No Known Allergies  Past Surgical History:  Procedure Laterality Date  . EYE SURGERY    . FEMUR IM NAIL Left 02/02/2018  . FEMUR IM NAIL Left 02/02/2018   Procedure: INTRAMEDULLARY (IM) NAIL LEFT FEMUR;  Surgeon: Roby Lofts, MD;  Location: [facility] OR;  Service: Orthopedics;  Laterality: Left;    Social History   Socioeconomic History  . Marital status: Divorced    Spouse name: Not on file  . Number of children: Not on file  . Years of education: Not on file  . Highest education level: Not on file  Occupational History  . Not on file  Social Needs  . Financial resource strain: Not on file  . Food insecurity:    Worry: Not on file    Inability: Not on file  . Transportation needs:    Medical: Not on file    Non-medical: Not on file  Tobacco Use  . Smoking status: Former Games developer  . Smokeless tobacco: Never Used  Substance and Sexual Activity  .  Alcohol use: Never    Frequency: Never  . Drug use: Never  . Sexual activity: Not on file  Lifestyle  . Physical activity:    Days per week: Not on file    Minutes per session: Not on file  . Stress: Not on file  Relationships  . Social connections:    Talks on phone: Not on file    Gets together: Not on file    Attends religious service: Not on file    Active member of club or organization: Not on file    Attends meetings of clubs or organizations: Not on file    Relationship status: Not on file  Other Topics Concern  . Not on file  Social History Narrative  . Not on file    Family History  Problem Relation Age of Onset  . Vitiligo Son      ROS Review of Systems See HPI Constitution: No fevers or chills No malaise No diaphoresis Skin: No rash or itching Eyes: no blurry vision, no double vision GU: no dysuria or hematuria Neuro: no dizziness or headaches * all others reviewed and negative   Objective: There were no vitals filed for this visit.  Physical Exam  Assessment and Plan There are no diagnoses linked to this encounter.   Travell Desaulniers P  Martyn Ehrich

## 2018-09-20 ENCOUNTER — Ambulatory Visit: Payer: Medicare Other | Admitting: Family Medicine

## 2018-09-21 ENCOUNTER — Encounter: Payer: Self-pay | Admitting: Family Medicine

## 2018-09-21 ENCOUNTER — Other Ambulatory Visit: Payer: Self-pay

## 2018-09-21 ENCOUNTER — Ambulatory Visit (INDEPENDENT_AMBULATORY_CARE_PROVIDER_SITE_OTHER): Payer: Medicare Other | Admitting: Family Medicine

## 2018-09-21 VITALS — BP 138/78 | HR 61 | Temp 97.5°F | Resp 16 | Ht 63.5 in | Wt 133.0 lb

## 2018-09-21 DIAGNOSIS — E2740 Unspecified adrenocortical insufficiency: Secondary | ICD-10-CM

## 2018-09-21 DIAGNOSIS — E1169 Type 2 diabetes mellitus with other specified complication: Secondary | ICD-10-CM

## 2018-09-21 DIAGNOSIS — E039 Hypothyroidism, unspecified: Secondary | ICD-10-CM

## 2018-09-21 LAB — GLUCOSE, POCT (MANUAL RESULT ENTRY): POC Glucose: 71 mg/dl (ref 70–99)

## 2018-09-21 NOTE — Progress Notes (Signed)
Established Patient Office Visit  Subjective:  Patient ID: [name], female    DOB: 12-22-1945  Age: 72 y.o. MRN: [id number]  CC:  Chief Complaint  Patient presents with  . 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  She reports that her sugars have been doing well and she does not check them as much. She sticks to a diabetic diet Lab Results  Component Value Date   HGBA1C 5.7 (H) 09/21/2018   Thyroid disease She is not sure which pill is her thyroid medication She does not have that with her today She denies palpitations, chest pains, tremors Lab Results  Component Value Date   TSH 0.010 (L) 09/21/2018   Component     Latest Ref Rng & Units 03/09/2018 09/21/2018  TSH     0.450 - 4.500 uIU/mL <0.006 (L) 0.010 (L)  T4,Free(Direct)     0.82 - 1.77 ng/dL [phone] (L)     Past Medical History:  Diagnosis Date  . Hypertension   . Type 2 diabetes mellitus (HCC)     Past Surgical History:  Procedure Laterality Date  . EYE SURGERY    . FEMUR IM NAIL Left 02/02/2018  . FEMUR IM NAIL Left 02/02/2018   Procedure: INTRAMEDULLARY (IM) NAIL LEFT FEMUR;  Surgeon: Roby LoftsHaddix, Kevin P, MD;  Location: [facility] OR;  Service: Orthopedics;  Laterality: Left;    Family History  Problem Relation Age of Onset  . Vitiligo Son     Social History   Socioeconomic History  . Marital status: Divorced    Spouse name: Not on file  . Number of children: Not on file  . Years of education: Not on file  . Highest education level: Not on file  Occupational History  . Not on file  Social Needs  . Financial resource strain: Not on file  . Food insecurity:    Worry: Not on file    Inability: Not on file  . Transportation  needs:    Medical: Not on file    Non-medical: Not on file  Tobacco Use  . Smoking status: Former Games developermoker  . Smokeless tobacco: Never Used  Substance and Sexual Activity  . Alcohol use: Never    Frequency: Never  . Drug use: Never  . Sexual activity: Not on file  Lifestyle  . Physical activity:    Days per week: Not on file    Minutes per session: Not on file  . Stress: Not on file  Relationships  . Social connections:    Talks on phone: Not on file    Gets together: Not on file    Attends religious service: Not on file    Active member of club or organization: Not on file    Attends meetings of clubs or organizations: Not on file    Relationship status: Not on file  . Intimate partner violence:    Fear of current or ex partner: Not on file    Emotionally abused: Not on file    Physically abused: Not on file    Forced sexual activity: Not on file  Other Topics Concern  . Not on file  Social History Narrative  .  Not on file    Outpatient Medications Prior to Visit  Medication Sig Dispense Refill  . hydrocortisone (CORTEF) 20 MG tablet Take 0.5 tablets (10 mg total) by mouth daily. 90 tablet 0  . levothyroxine (SYNTHROID, LEVOTHROID) 50 MCG tablet Take 1 tablet (50 mcg total) by mouth daily. 90 tablet 1  . Multiple Vitamins-Minerals (CENTRUM SILVER 50+WOMEN) TABS Take 1 tablet by mouth daily.    . polyethylene glycol (MIRALAX / GLYCOLAX) packet Take 17 g by mouth 2 (two) times daily. (until having soft bowel movement daily, then use as needed or daily) 14 each 0  . prednisoLONE acetate (PRED FORTE) 1 % ophthalmic suspension Place 1 drop into both eyes 2 (two) times daily. 5 mL 3  . sodium chloride (MURO 128) 2 % ophthalmic solution Place 1 drop into both eyes 2 (two) times daily. 15 mL 0   No facility-administered medications prior to visit.     No Known Allergies  ROS Review of Systems Review of Systems  Constitutional: Negative for activity change, appetite change,  chills and fever.  HENT: Negative for congestion, nosebleeds, trouble swallowing and voice change.   Respiratory: Negative for cough, shortness of breath and wheezing.   Gastrointestinal: Negative for diarrhea, nausea and vomiting.  Genitourinary: Negative for difficulty urinating, dysuria, flank pain and hematuria.  Musculoskeletal: Negative for back pain, joint swelling and neck pain.  Neurological: Negative for dizziness, speech difficulty, light-headedness and numbness.  See HPI. All other review of systems negative.     Objective:    Physical Exam  BP 138/78   Pulse 61   Temp (!) 97.5 F (36.4 C) (Oral)   Resp 16   Ht 5' 3.5" (1.613 m)   Wt 133 lb (60.3 kg)   SpO2 99%   BMI 23.19 kg/m  Wt Readings from Last 3 Encounters:  09/21/18 133 lb (60.3 kg)  05/11/18 124 lb 9.6 oz (56.5 kg)  03/09/18 119 lb (54 kg)   Physical Exam  Constitutional: Oriented to person, place, and time. Appears well-developed and well-nourished.  HENT:  Head: Normocephalic and atraumatic.  Eyes: visual impairment but normal conjunctiva  Cardiovascular: Normal rate, regular rhythm, normal heart sounds and intact distal pulses.  No murmur heard. Pulmonary/Chest: Effort normal and breath sounds normal. No stridor. No respiratory distress. Has no wheezes.  Neurological: Is alert and oriented to person, place, and time.  Skin: Skin is warm. Capillary refill takes less than 2 seconds.  Psychiatric: Has a normal mood and affect. Behavior is normal. Judgment and thought content normal.    Health Maintenance Due  Topic Date Due  . OPHTHALMOLOGY EXAM  12/31/1954  . URINE MICROALBUMIN  12/31/1954  . TETANUS/TDAP  12/31/1963  . COLONOSCOPY  12/31/1994  . DEXA SCAN  12/30/2009  . PNA vac Low Risk Adult (1 of 2 - PCV13) 12/30/2009  . MAMMOGRAM  08/26/2011  . INFLUENZA VACCINE  05/11/2018    There are no preventive care reminders to display for this patient.  Lab Results  Component Value Date   TSH  0.010 (L) 09/21/2018   Lab Results  Component Value Date   WBC 8.6 05/04/2018   HGB 13.3 05/04/2018   HCT 41.1 05/04/2018   MCV 85 05/04/2018   PLT 304 05/04/2018   Lab Results  Component Value Date   NA 144 03/09/2018   K 4.1 03/09/2018   CO2 24 03/09/2018   GLUCOSE 98 03/09/2018   BUN 19 03/09/2018   CREATININE 1.08 (H) 03/09/2018  BILITOT 0.5 03/09/2018   ALKPHOS 150 (H) 03/09/2018   AST 23 03/09/2018   ALT 14 03/09/2018   PROT 5.8 (L) 03/09/2018   ALBUMIN 3.5 03/09/2018   CALCIUM 9.2 03/09/2018   ANIONGAP 3 (L) 02/06/2018   Lab Results  Component Value Date   CHOL 185 05/04/2018   Lab Results  Component Value Date   HDL 56 05/04/2018   Lab Results  Component Value Date   LDLCALC 112 (H) 05/04/2018   Lab Results  Component Value Date   TRIG 85 05/04/2018   Lab Results  Component Value Date   CHOLHDL 3.3 05/04/2018   Lab Results  Component Value Date   HGBA1C 5.7 (H) 09/21/2018      Assessment & Plan:   Problem List Items Addressed This Visit      Endocrine   Adrenal insufficiency (HCC)    Will continue to monitor      Relevant Orders   Hemoglobin A1c (Completed)   Hypothyroidism    Discussed her thyroid, will recheck to see if she is still hyperthyroid      Relevant Orders   TSH + free T4 (Completed)   Type II diabetes mellitus (HCC) - Primary    Diabetes well controlled, continue healthy diet      Relevant Orders   POCT glucose (manual entry) (Completed)   Hemoglobin A1c (Completed)   TSH + free T4 (Completed)      No orders of the defined types were placed in this encounter.   Follow-up: Return in about 6 months (around 03/23/2019) for medicare wellness exam .    Doristine Bosworth, MD

## 2018-09-21 NOTE — Patient Instructions (Signed)
° ° ° °  If you have lab work done today you will be contacted with your lab results within the next 2 weeks.  If you have not heard from us then please contact us. The fastest way to get your results is to register for My Chart. ° ° °IF you received an x-ray today, you will receive an invoice from [facility]. Please contact [facility] at [phone] with questions or concerns regarding your invoice.  ° °IF you received labwork today, you will receive an invoice from [facility]. Please contact [facility] at [phone] with questions or concerns regarding your invoice.  ° °Our billing staff will not be able to assist you with questions regarding bills from these companies. ° °You will be contacted with the lab results as soon as they are available. The fastest way to get your results is to activate your My Chart account. Instructions are located on the last page of this paperwork. If you have not heard from us regarding the results in 2 weeks, please contact this office. °  ° ° ° °

## 2018-09-22 LAB — HEMOGLOBIN A1C
Est. average glucose Bld gHb Est-mCnc: 117 mg/dL
Hgb A1c MFr Bld: 5.7 % — ABNORMAL HIGH (ref 4.8–5.6)

## 2018-09-22 LAB — TSH+FREE T4
Free T4: 0.81 ng/dL — ABNORMAL LOW (ref 0.82–1.77)
TSH: 0.01 u[IU]/mL — ABNORMAL LOW (ref 0.450–4.500)

## 2018-09-23 ENCOUNTER — Telehealth: Payer: Self-pay | Admitting: Family Medicine

## 2018-09-23 NOTE — Assessment & Plan Note (Signed)
Will continue to monitor.

## 2018-09-23 NOTE — Telephone Encounter (Signed)
Let message on [name]'s phone for them to call back to clarify how the patient was taking her thyroid medication. If she was taking her medication by mouth from a pill box or getting it handed to her. She may have taken extra doses between May and December  Component     Latest Ref Rng & Units 03/09/2018 09/21/2018  TSH     0.450 - 4.500 uIU/mL <0.006 (L) 0.010 (L)  T4,Free(Direct)     0.82 - 1.77 ng/dL 1.911.22 4.780.81 (L)   Her levels were in the hyperthyroid range and if she was taking it correctly then she would needs to get her medication adjusted.

## 2018-09-23 NOTE — Assessment & Plan Note (Signed)
Diabetes well controlled, continue healthy diet

## 2018-09-23 NOTE — Assessment & Plan Note (Signed)
Discussed her thyroid, will recheck to see if she is still hyperthyroid

## 2018-11-25 ENCOUNTER — Other Ambulatory Visit: Payer: Self-pay | Admitting: Family Medicine

## 2018-11-27 NOTE — Telephone Encounter (Signed)
Requested Prescriptions  Pending Prescriptions Disp Refills  . levothyroxine (SYNTHROID, LEVOTHROID) 50 MCG tablet [Pharmacy Med Name: Levothyroxine Sodium 50 MCG Oral Tablet] 90 tablet 0    Sig: TAKE 1 TABLET BY MOUTH ONCE DAILY     Endocrinology:  Hypothyroid Agents Failed - 11/25/2018 11:12 AM      Failed - TSH needs to be rechecked within 3 months after an abnormal result. Refill until TSH is due.      Failed - TSH in normal range and within 360 days    TSH  Date Value Ref Range Status  09/21/2018 0.010 (L) 0.450 - 4.500 uIU/mL Final         Passed - Valid encounter within last 12 months    Recent Outpatient Visits          2 months ago Type 2 diabetes mellitus with other specified complication, without long-term current use of insulin (HCC)   Primary Care at [facility], Zoe A, MD   6 months ago Type 2 diabetes mellitus with complication, without long-term current use of insulin (HCC)   Primary Care at [facility], Zoe A, MD   8 months ago Type 2 diabetes mellitus with complication, without long-term current use of insulin (HCC)   Primary Care at [facility], Manus Rudd, MD      Future Appointments            In 3 months Doristine Bosworth, MD Primary Care at [place], [facility]

## 2018-11-28 ENCOUNTER — Telehealth: Payer: Self-pay | Admitting: Family Medicine

## 2018-11-28 NOTE — Telephone Encounter (Signed)
Spoke with Bjorn Loser, pt.'s daughter-in-law (on [place]). Asking about lab results from December. Dr. Creta Levin needs to know how pt. Is taking her thyroid medication. Pt. Is currently sleeping. Bjorn Loser will call back when pt. Is awake and let us know pt. Has been taking that medication.

## 2018-11-28 NOTE — Telephone Encounter (Signed)
Please advise, do her medication need to be adjusted at this time since her labs show that her thyroid is still elevated?

## 2018-11-28 NOTE — Telephone Encounter (Signed)
Copied from CRM [id number]. Topic: Quick Communication - See Telephone Encounter >> Nov 28, 2018  8:47 AM Jens Som A wrote: CRM for notification. See Telephone encounter for: 11/28/18.  Patient daughter in law is calling because her mother in law results where given to her brother in law the results were not passed along. Daughter in law Bjorn Loser [phone] is calling back for the lab results and is asking what is going to be done. Please advise. Thank you

## 2018-11-28 NOTE — Telephone Encounter (Signed)
Bjorn Loser, daughter-in-law, reports pt. Has been taking Synthroid 50 mcg one daily.If there any changes, please call Bjorn Loser.

## 2018-11-30 NOTE — Telephone Encounter (Signed)
Patient's daughter in law calling to check the status of the response from Dr Creta Levin. Would like to know if the patient needs to continue taking the medication as directed until Dr Creta Levin advises?

## 2018-12-04 ENCOUNTER — Telehealth: Payer: Self-pay | Admitting: Family Medicine

## 2018-12-04 DIAGNOSIS — E2740 Unspecified adrenocortical insufficiency: Secondary | ICD-10-CM

## 2018-12-04 DIAGNOSIS — E1169 Type 2 diabetes mellitus with other specified complication: Secondary | ICD-10-CM

## 2018-12-04 DIAGNOSIS — E0580 Other thyrotoxicosis without thyrotoxic crisis or storm: Secondary | ICD-10-CM

## 2018-12-04 DIAGNOSIS — E039 Hypothyroidism, unspecified: Secondary | ICD-10-CM

## 2018-12-04 NOTE — Telephone Encounter (Signed)
Would like labs checked before the next office visit

## 2018-12-04 NOTE — Telephone Encounter (Signed)
Called and spoke to pt son and informed him to bring his mom in for a lab visit and then after Dr. Verlon Setting her labs, we will give him a call about medication. He verbalized understanding.

## 2018-12-04 NOTE — Telephone Encounter (Signed)
Spoke to pt son about concerns and recommendations. He verbalized understanding.

## 2018-12-06 ENCOUNTER — Ambulatory Visit (INDEPENDENT_AMBULATORY_CARE_PROVIDER_SITE_OTHER): Payer: Medicare Other | Admitting: Family Medicine

## 2018-12-06 DIAGNOSIS — E2740 Unspecified adrenocortical insufficiency: Secondary | ICD-10-CM

## 2018-12-06 DIAGNOSIS — E0580 Other thyrotoxicosis without thyrotoxic crisis or storm: Secondary | ICD-10-CM

## 2018-12-06 DIAGNOSIS — E1169 Type 2 diabetes mellitus with other specified complication: Secondary | ICD-10-CM | POA: Diagnosis not present

## 2018-12-06 NOTE — Progress Notes (Signed)
Lab visit only. 

## 2018-12-07 LAB — CBC
HEMOGLOBIN: 13.1 g/dL (ref 11.1–15.9)
Hematocrit: 38.2 % (ref 34.0–46.6)
MCH: 27.3 pg (ref 26.6–33.0)
MCHC: 34.3 g/dL (ref 31.5–35.7)
MCV: 80 fL (ref 79–97)
Platelets: 230 10*3/uL (ref 150–450)
RBC: 4.8 x10E6/uL (ref 3.77–5.28)
RDW: 13.4 % (ref 11.7–15.4)
WBC: 6.7 10*3/uL (ref 3.4–10.8)

## 2018-12-07 LAB — HEMOGLOBIN A1C
Est. average glucose Bld gHb Est-mCnc: 120 mg/dL
Hgb A1c MFr Bld: 5.8 % — ABNORMAL HIGH (ref 4.8–5.6)

## 2018-12-07 LAB — TSH+FREE T4
Free T4: 0.88 ng/dL (ref 0.82–1.77)
TSH: 0.007 u[IU]/mL — ABNORMAL LOW (ref 0.450–4.500)

## 2018-12-07 LAB — CORTISOL: Cortisol: 24.1 ug/dL

## 2018-12-13 ENCOUNTER — Other Ambulatory Visit: Payer: Self-pay | Admitting: Family Medicine

## 2018-12-14 NOTE — Telephone Encounter (Signed)
Requested medication (s) are due for refill today: Yes  Requested medication (s) are on the active medication list: Yes  Last refill:  05/11/18  Future visit scheduled: Yes  Notes to clinic:  See request    Requested Prescriptions  Pending Prescriptions Disp Refills   hydrocortisone (CORTEF) 20 MG tablet [Pharmacy Med Name: Hydrocortisone 20 MG Oral Tablet] 90 tablet 0    Sig: Take 1/2 (one-half) tablet by mouth once daily     Not Delegated - Endocrinology:  Oral Corticosteroids Failed - 12/13/2018  7:25 PM      Failed - This refill cannot be delegated      Passed - Last BP in normal range    BP Readings from Last 1 Encounters:  09/21/18 138/78         Passed - Valid encounter within last 6 months    Recent Outpatient Visits          1 week ago Adrenal insufficiency (HCC)   Primary Care at [facility], Meda Coffee, MD   2 months ago Type 2 diabetes mellitus with other specified complication, without long-term current use of insulin (HCC)   Primary Care at [facility], Zoe A, MD   7 months ago Type 2 diabetes mellitus with complication, without long-term current use of insulin (HCC)   Primary Care at [facility], Zoe A, MD   9 months ago Type 2 diabetes mellitus with complication, without long-term current use of insulin (HCC)   Primary Care at [facility], Manus Rudd, MD      Future Appointments            In 2 months Doristine Bosworth, MD Primary Care at [place], [facility]

## 2018-12-15 NOTE — Telephone Encounter (Signed)
Please advise Patient is requesting a refill of the following medications: Requested Prescriptions   Pending Prescriptions Disp Refills  . hydrocortisone (CORTEF) 20 MG tablet [Pharmacy Med Name: Hydrocortisone 20 MG Oral Tablet] 90 tablet 0    Sig: Take 1/2 (one-half) tablet by mouth once daily

## 2019-02-25 ENCOUNTER — Other Ambulatory Visit: Payer: Self-pay | Admitting: Family Medicine

## 2019-02-25 NOTE — Telephone Encounter (Signed)
Requested Prescriptions  Pending Prescriptions Disp Refills  . levothyroxine (SYNTHROID) 50 MCG tablet [Pharmacy Med Name: Levothyroxine Sodium 50 MCG Oral Tablet] 90 tablet 0    Sig: Take 1 tablet by mouth once daily     Endocrinology:  Hypothyroid Agents Failed - 02/25/2019  5:16 PM      Failed - TSH needs to be rechecked within 3 months after an abnormal result. Refill until TSH is due.      Failed - TSH in normal range and within 360 days    TSH  Date Value Ref Range Status  12/06/2018 0.007 (L) 0.450 - 4.500 uIU/mL Final         Passed - Valid encounter within last 12 months    Recent Outpatient Visits          2 months ago Adrenal insufficiency [facility])   Primary Care at [facility], Meda Coffee, MD   5 months ago Type 2 diabetes mellitus with other specified complication, without long-term current use of insulin (HCC)   Primary Care at [facility] [place] A, MD   9 months ago Type 2 diabetes mellitus with complication, without long-term current use of insulin (HCC)   Primary Care at [facility], Zoe A, MD   11 months ago Type 2 diabetes mellitus with complication, without long-term current use of insulin (HCC)   Primary Care at [facility], Manus Rudd, MD      Future Appointments            In 3 weeks Doristine Bosworth, MD Primary Care at [place], [facility]

## 2019-02-28 ENCOUNTER — Ambulatory Visit: Payer: Medicare Other | Admitting: Family Medicine

## 2019-03-19 ENCOUNTER — Emergency Department (HOSPITAL_COMMUNITY): Payer: Medicare Other

## 2019-03-19 ENCOUNTER — Ambulatory Visit (INDEPENDENT_AMBULATORY_CARE_PROVIDER_SITE_OTHER): Payer: Medicare Other | Admitting: Family Medicine

## 2019-03-19 ENCOUNTER — Emergency Department (HOSPITAL_COMMUNITY)
Admission: EM | Admit: 2019-03-19 | Discharge: 2019-03-19 | Disposition: A | Discharge status: Home / Self Care | Payer: Medicare Other | Attending: Emergency Medicine | Admitting: Emergency Medicine

## 2019-03-19 ENCOUNTER — Encounter (HOSPITAL_COMMUNITY): Payer: Self-pay | Admitting: Emergency Medicine

## 2019-03-19 ENCOUNTER — Other Ambulatory Visit: Payer: Self-pay

## 2019-03-19 ENCOUNTER — Encounter: Payer: Self-pay | Admitting: Family Medicine

## 2019-03-19 VITALS — BP 183/92 | HR 69 | Temp 98.3°F | Resp 17 | Ht 63.5 in | Wt 130.0 lb

## 2019-03-19 DIAGNOSIS — I10 Essential (primary) hypertension: Secondary | ICD-10-CM | POA: Diagnosis not present

## 2019-03-19 DIAGNOSIS — R42 Dizziness and giddiness: Secondary | ICD-10-CM | POA: Diagnosis not present

## 2019-03-19 DIAGNOSIS — R531 Weakness: Secondary | ICD-10-CM | POA: Diagnosis not present

## 2019-03-19 DIAGNOSIS — E119 Type 2 diabetes mellitus without complications: Secondary | ICD-10-CM | POA: Diagnosis not present

## 2019-03-19 DIAGNOSIS — Z87891 Personal history of nicotine dependence: Secondary | ICD-10-CM | POA: Diagnosis not present

## 2019-03-19 DIAGNOSIS — Z79899 Other long term (current) drug therapy: Secondary | ICD-10-CM | POA: Insufficient documentation

## 2019-03-19 DIAGNOSIS — R404 Transient alteration of awareness: Secondary | ICD-10-CM | POA: Diagnosis not present

## 2019-03-19 DIAGNOSIS — H93293 Other abnormal auditory perceptions, bilateral: Secondary | ICD-10-CM | POA: Diagnosis not present

## 2019-03-19 DIAGNOSIS — R0602 Shortness of breath: Secondary | ICD-10-CM | POA: Diagnosis not present

## 2019-03-19 DIAGNOSIS — J3489 Other specified disorders of nose and nasal sinuses: Secondary | ICD-10-CM | POA: Diagnosis not present

## 2019-03-19 DIAGNOSIS — Z20828 Contact with and (suspected) exposure to other viral communicable diseases: Secondary | ICD-10-CM | POA: Diagnosis not present

## 2019-03-19 DIAGNOSIS — R030 Elevated blood-pressure reading, without diagnosis of hypertension: Secondary | ICD-10-CM | POA: Diagnosis present

## 2019-03-19 DIAGNOSIS — I161 Hypertensive emergency: Secondary | ICD-10-CM

## 2019-03-19 DIAGNOSIS — R000 Tachycardia, unspecified: Secondary | ICD-10-CM | POA: Diagnosis not present

## 2019-03-19 HISTORY — DX: Primary adrenocortical insufficiency: E27.1

## 2019-03-19 HISTORY — DX: Disorder of thyroid, unspecified: E07.9

## 2019-03-19 LAB — URINALYSIS, ROUTINE W REFLEX MICROSCOPIC
Bacteria, UA: NONE SEEN
Bilirubin Urine: NEGATIVE
Glucose, UA: NEGATIVE mg/dL
Hgb urine dipstick: NEGATIVE
Ketones, ur: 5 mg/dL — AB
Nitrite: NEGATIVE
Protein, ur: NEGATIVE mg/dL
Specific Gravity, Urine: 1.008 (ref 1.005–1.030)
pH: 8 (ref 5.0–8.0)

## 2019-03-19 LAB — CBC WITH DIFFERENTIAL/PLATELET
Abs Immature Granulocytes: 0.04 10*3/uL (ref 0.00–0.07)
Basophils Absolute: 0.1 10*3/uL (ref 0.0–0.1)
Basophils Relative: 1 %
Eosinophils Absolute: 0.2 10*3/uL (ref 0.0–0.5)
Eosinophils Relative: 2 %
HCT: 41.1 % (ref 36.0–46.0)
Hemoglobin: 13.7 g/dL (ref 12.0–15.0)
Immature Granulocytes: 0 %
Lymphocytes Relative: 45 %
Lymphs Abs: 4 10*3/uL (ref 0.7–4.0)
MCH: 26.9 pg (ref 26.0–34.0)
MCHC: 33.3 g/dL (ref 30.0–36.0)
MCV: 80.7 fL (ref 80.0–100.0)
Monocytes Absolute: 0.5 10*3/uL (ref 0.1–1.0)
Monocytes Relative: 6 %
Neutro Abs: 4.3 10*3/uL (ref 1.7–7.7)
Neutrophils Relative %: 46 %
Platelets: 240 10*3/uL (ref 150–400)
RBC: 5.09 MIL/uL (ref 3.87–5.11)
RDW: 13.2 % (ref 11.5–15.5)
WBC: 9.1 10*3/uL (ref 4.0–10.5)
nRBC: 0 % (ref 0.0–0.2)

## 2019-03-19 LAB — COMPREHENSIVE METABOLIC PANEL
ALT: 12 U/L (ref 0–44)
AST: 28 U/L (ref 15–41)
Albumin: 4 g/dL (ref 3.5–5.0)
Alkaline Phosphatase: 62 U/L (ref 38–126)
Anion gap: 16 — ABNORMAL HIGH (ref 5–15)
BUN: 14 mg/dL (ref 8–23)
CO2: 18 mmol/L — ABNORMAL LOW (ref 22–32)
Calcium: 9.6 mg/dL (ref 8.9–10.3)
Chloride: 107 mmol/L (ref 98–111)
Creatinine, Ser: 1.14 mg/dL — ABNORMAL HIGH (ref 0.44–1.00)
GFR calc Af Amer: 55 mL/min — ABNORMAL LOW (ref >60)
GFR calc non Af Amer: 47 mL/min — ABNORMAL LOW (ref >60)
Glucose, Bld: 83 mg/dL (ref 70–99)
Potassium: 3.3 mmol/L — ABNORMAL LOW (ref 3.5–5.1)
Sodium: 141 mmol/L (ref 135–145)
Total Bilirubin: 0.7 mg/dL (ref 0.3–1.2)
Total Protein: 6.7 g/dL (ref 6.5–8.1)

## 2019-03-19 LAB — SARS CORONAVIRUS 2: SARS Coronavirus 2: NOT DETECTED

## 2019-03-19 LAB — TROPONIN I: Troponin I: <0.03 ng/mL (ref <0.03)

## 2019-03-19 MED ORDER — HYDRALAZINE HCL 20 MG/ML IJ SOLN
5.0000 mg | Freq: Once | INTRAMUSCULAR | Status: AC
  Administered 2019-03-19: 5 mg via INTRAVENOUS
  Filled 2019-03-19: qty 1

## 2019-03-19 MED ORDER — HYDROCORTISONE 20 MG PO TABS: ORAL_TABLET | ORAL | 0 refills | Status: DC

## 2019-03-19 MED ORDER — SODIUM CHLORIDE 0.9 % IV BOLUS
500.0000 mL | Freq: Once | INTRAVENOUS | Status: AC
  Administered 2019-03-19: 500 mL via INTRAVENOUS

## 2019-03-19 MED ORDER — HYDROCHLOROTHIAZIDE 25 MG PO TABS: 25.0000 mg | ORAL_TABLET | Freq: Every day | ORAL | 0 refills | Status: DC

## 2019-03-19 MED ORDER — LORAZEPAM 2 MG/ML IJ SOLN
0.5000 mg | Freq: Once | INTRAMUSCULAR | Status: AC
  Administered 2019-03-19: 0.5 mg via INTRAVENOUS
  Filled 2019-03-19: qty 1

## 2019-03-19 NOTE — ED Notes (Addendum)
Again upon standing, to use commode, pt Bp was 80/66. Pt. Was alert and talking throughout time on the commode, but noted lower BP while standing as before with orthostatics.

## 2019-03-19 NOTE — ED Triage Notes (Signed)
Pt reports a "whirring" noise in her head, denies h/a, mild dizziness since Wednesday. Pt has been feeling anxious.  Son placed ice pack on head and noise went away.  This morning she went to PCP and had systolics in the 748'O, pt began getting anxious again and whirring noise returned.

## 2019-03-19 NOTE — ED Notes (Signed)
Upon standing, pt states she needed to sit back down, once the pressure showed 89/38, she started to slowly pass out, possibly. Sat her back down with help of her son, layed pt. Back down in tredelenburg. Took another pressure in this position, 161/73 with 68 pulse, 100 spo2.

## 2019-03-19 NOTE — ED Notes (Addendum)
MD Messick aware of systolics in the 01'T upon d/c, pt sleepier than earlier and slower to respond.  Will obtain manual bp and EKG, hang fluids and continue to monitor per MD Messicks instruction.  Son still at bed side.

## 2019-03-19 NOTE — ED Notes (Signed)
Patient transported to CT 

## 2019-03-19 NOTE — ED Notes (Addendum)
Discharge instructions discussed with Pt and son. Pt verbalized understanding. Pt stable and leaving via [place] with son.

## 2019-03-19 NOTE — Progress Notes (Signed)
Established Patient Office Visit  Subjective:  Patient ID: [name], female    DOB: October 03, 1946  Age: 73 y.o. MRN: [id number]  CC:  Chief Complaint  Patient presents with  . Medicare Wellness  . hallucinations hearing noises in her head (noise in head) to   Bjorn Loserhonda and [employer] Soupziel Shalem on the phone  Percival SpanishShawn Porras   HPI Joanne ChampagneVermell Matar presents for   Patient is here with Percival SpanishShawn Biby She was hearing noises in her head and was a roaring sound onset was 03/14/2019. She is completely deaf so there is no chance it could be an outside noise She put some ice packs over her head to get to relax  She is hearing the roaring  No blood pressures at the time She was not taken to the ER   Due to concern about covid the family did not take her to the ER Her daughter in law reports that the patient told her she wsa dizzy over the week  Communication is with a dry erase board to ask her questions and she can answer by speaking. When asked are you hearing the sound now? It is like a loud roaring sound and it goes on all day and all night It seems like a sound like two blocks being hit together in her ear She was talking to God about it because she did not know where it was coming from and God said it was coming from her lungs.  She states that she does up body exercise  If she is not getting enough air then she gets difficulty with breathing and then she gets the noise in her head and her sinuses get clogged.  She states that she just had on a bra and she had to remove it because she felt like she could not    While in the room gathering the history the patient reports that she is feeling dizzy and needs to lay down    Past Medical History:  Diagnosis Date  . Hypertension   . Type 2 diabetes mellitus (HCC)     Past Surgical History:  Procedure Laterality Date  . EYE SURGERY    . FEMUR IM NAIL Left 02/02/2018  . FEMUR IM NAIL Left 02/02/2018   Procedure: INTRAMEDULLARY (IM)  NAIL LEFT FEMUR;  Surgeon: Roby LoftsHaddix, Kevin P, MD;  Location: [facility] OR;  Service: Orthopedics;  Laterality: Left;    Family History  Problem Relation Age of Onset  . Vitiligo Son     Social History   Socioeconomic History  . Marital status: Divorced    Spouse name: Not on file  . Number of children: Not on file  . Years of education: Not on file  . Highest education level: Not on file  Occupational History  . Not on file  Social Needs  . Financial resource strain: Not on file  . Food insecurity:    Worry: Not on file    Inability: Not on file  . Transportation needs:    Medical: Not on file    Non-medical: Not on file  Tobacco Use  . Smoking status: Former Games developermoker  . Smokeless tobacco: Never Used  Substance and Sexual Activity  . Alcohol use: Never    Frequency: Never  . Drug use: Never  . Sexual activity: Not on file  Lifestyle  . Physical activity:    Days per week: Not on file    Minutes per session: Not on file  . Stress: Not on file  Relationships  . Social connections:    Talks on phone: Not on file    Gets together: Not on file    Attends religious service: Not on file    Active member of club or organization: Not on file    Attends meetings of clubs or organizations: Not on file    Relationship status: Not on file  . Intimate partner violence:    Fear of current or ex partner: Not on file    Emotionally abused: Not on file    Physically abused: Not on file    Forced sexual activity: Not on file  Other Topics Concern  . Not on file  Social History Narrative  . Not on file    Outpatient Medications Prior to Visit  Medication Sig Dispense Refill  . hydrocortisone (CORTEF) 20 MG tablet Take 1/2 (one-half) tablet by mouth once daily 90 tablet 0  . levothyroxine (SYNTHROID) 50 MCG tablet Take 1 tablet by mouth once daily 90 tablet 0  . Multiple Vitamins-Minerals (CENTRUM SILVER 50+WOMEN) TABS Take 1 tablet by mouth daily.    . polyethylene glycol (MIRALAX /  GLYCOLAX) packet Take 17 g by mouth 2 (two) times daily. (until having soft bowel movement daily, then use as needed or daily) 14 each 0  . prednisoLONE acetate (PRED FORTE) 1 % ophthalmic suspension Place 1 drop into both eyes 2 (two) times daily. 5 mL 3  . Propylene Glycol (SYSTANE BALANCE OP) Apply to eye.    . sodium chloride (MURO 128) 2 % ophthalmic solution Place 1 drop into both eyes 2 (two) times daily. (Patient not taking: Reported on 03/19/2019) 15 mL 0   No facility-administered medications prior to visit.     No Known Allergies  ROS Review of Systems  Constitutional: Positive for diaphoresis.  HENT: Positive for ear pain. Negative for congestion, drooling, facial swelling, hearing loss, mouth sores and sinus pressure.   Respiratory: Positive for chest tightness and shortness of breath. Negative for choking, wheezing and stridor.   Cardiovascular: Negative for chest pain, palpitations and leg swelling.  Gastrointestinal: Negative for blood in stool, constipation and nausea.  Genitourinary: Negative for difficulty urinating, dyspareunia, dysuria, enuresis and flank pain.  Neurological: Positive for light-headedness. Negative for tremors, seizures, syncope, speech difficulty, numbness and headaches.  Hematological: Negative for adenopathy. Does not bruise/bleed easily.  Psychiatric/Behavioral: Negative for dysphoric mood and hallucinations. The patient is nervous/anxious.       Objective:    Physical Exam  Constitutional: She appears well-developed and well-nourished.  HENT:  Head: Normocephalic and atraumatic.  Right Ear: External ear normal.  Left Ear: External ear normal.  Nose: Nose normal.  Mouth dry  Eyes: Conjunctivae and EOM are normal.  Neck: Normal range of motion. Neck supple.  Cardiovascular: Normal rate, regular rhythm and normal heart sounds.  No murmur heard. Pulmonary/Chest: Effort normal and breath sounds normal. No respiratory distress. She has no  wheezes. She has no rales.  Abdominal: Soft. Bowel sounds are normal. She exhibits no distension. There is no abdominal tenderness. There is no rebound and no guarding.  Musculoskeletal: Normal range of motion.  Neurological: She is alert.  Skin: Skin is warm. No rash noted.  Psychiatric: Her behavior is normal. Judgment and thought content normal.  Anxious mood    BP (!) 183/92 (BP Location: Right Arm, Patient Position: Sitting, Cuff Size: Normal)   Pulse 69   Temp 98.3 F (36.8 C) (Oral)   Resp 17   Ht 5' 3.5" (  1.613 m)   Wt 130 lb (59 kg)   SpO2 100%   BMI 22.67 kg/m  Wt Readings from Last 3 Encounters:  03/19/19 130 lb (59 kg)  09/21/18 133 lb (60.3 kg)  05/11/18 124 lb 9.6 oz (56.5 kg)     Health Maintenance Due  Topic Date Due  . OPHTHALMOLOGY EXAM  12/31/1954  . URINE MICROALBUMIN  12/31/1954  . TETANUS/TDAP  12/31/1963  . COLONOSCOPY  12/31/1994  . DEXA SCAN  12/30/2009  . PNA vac Low Risk Adult (1 of 2 - PCV13) 12/30/2009  . MAMMOGRAM  08/26/2011  . FOOT EXAM  03/10/2019    There are no preventive care reminders to display for this patient.  Lab Results  Component Value Date   TSH 0.007 (L) 12/06/2018   Lab Results  Component Value Date   WBC 6.7 12/06/2018   HGB 13.1 12/06/2018   HCT 38.2 12/06/2018   MCV 80 12/06/2018   PLT 230 12/06/2018   Lab Results  Component Value Date   NA 144 03/09/2018   K 4.1 03/09/2018   CO2 24 03/09/2018   GLUCOSE 98 03/09/2018   BUN 19 03/09/2018   CREATININE 1.08 (H) 03/09/2018   BILITOT 0.5 03/09/2018   ALKPHOS 150 (H) 03/09/2018   AST 23 03/09/2018   ALT 14 03/09/2018   PROT 5.8 (L) 03/09/2018   ALBUMIN 3.5 03/09/2018   CALCIUM 9.2 03/09/2018   ANIONGAP 3 (L) 02/06/2018   Lab Results  Component Value Date   CHOL 185 05/04/2018   Lab Results  Component Value Date   HDL 56 05/04/2018   Lab Results  Component Value Date   LDLCALC 112 (H) 05/04/2018   Lab Results  Component Value Date   TRIG 85  05/04/2018   Lab Results  Component Value Date   CHOLHDL 3.3 05/04/2018   Lab Results  Component Value Date   HGBA1C 5.8 (H) 12/06/2018      Assessment & Plan:   Problem List Items Addressed This Visit    None    Visit Diagnoses    Hypertensive emergency    -  Primary   Uncontrolled hypertension       Auditory complaints, bilateral       Dizziness and giddiness          Called EMS to the clinic to transfer patient to ER for workup Due to her roaring headache and her severely elevated bp discussed with her family and advised that she gets evaluated right away Discussed with patient that she has to go to the hospital and she is agreeable Discussed with patient's son Ines BloomerShawn in person and on the phone with Bjorn Loserhonda and Aziel  Based on her history of hypothyroidism and addrenal insuffiencey and now uncontrolled bp will have to monitor closely.   Also of note patient's family called answering service on 03/15/2019 and was instructed to take the patient in the ER These instructions were not followed and the same instruction was given on Friday 03/16/2019 when the family called for an office visit.   A total of 55 minutes were spent face-to-face with the patient during this encounter and over half of that time was spent on counseling and coordination of care.   No orders of the defined types were placed in this encounter.   Follow-up: No follow-ups on file.    Doristine BosworthZoe A Panagiota Perfetti, MD

## 2019-03-19 NOTE — ED Notes (Signed)
After sitting up for few mins and transferring from commode to bed (sitting up still), BP 158/72.

## 2019-03-19 NOTE — Discharge Instructions (Signed)
Please return for any problem.  Follow-up with your regular care provider as instructed.  Please keep a diary of your blood pressures as instructed.  You may start the prescribed antihypertensive medication as instructed.

## 2019-03-19 NOTE — ED Provider Notes (Addendum)
[facility]   CSN: [id number] Arrival date & time: 03/19/19  1108    History   Chief Complaint Chief Complaint  Patient presents with  . Headache  . Dizziness  . Shortness of Breath    HPI [name] is a 73 y.o. female.     73 year old female presents by EMS from primary care [place] for evaluation of elevated blood pressure.  Patient with history of deafness and significant visual impairment.  She primarily communicates to her son.  Apparently, the patient was brought to the clinic today for evaluation of a "whooshing" noise in her head.  This is been going on for at least the last for 5 days.  The clinic noticed that the patient had an elevated blood pressure.  She is not currently on antihypertensives.  She was directed to the ED for further evaluation.  Upon arrival to the ED the patient appears to be anxious.  She is unable to provide significant history.  Patient's son is at bedside. He reports that the patient has been acting her normal self but has been complaining about hearing this "whooshing" noise in her head.  This is been going on for the last for 5 days.  Patient without complaint of headache, chest pain, shortness of breath, fever, nausea, vomiting, or other acute complaint.  She does not have a prior history of hypertension per report.  She reportedly has been compliant with all her medications.  The history is provided by the patient, a relative and medical records.  Illness  Location:  Elevated BP Severity:  Moderate Onset quality:  Gradual Duration:  5 days Timing:  Constant Progression:  Worsening Chronicity:  New Associated symptoms: no chest pain and no fever     Past Medical History:  Diagnosis Date  . Adrenal insufficiency (Addison's disease) (HCC)   . Hypertension   . Thyroid disease    hypothyroidism  . Type 2 diabetes mellitus [facility](HCC)     Patient Active Problem List   Diagnosis Date  Noted  . Anemia 02/02/2018  . Thrombocytopenia (HCC) 02/02/2018  . Hypokalemia 02/02/2018  . Hypomagnesemia 02/02/2018  . Femur fracture, left (HCC) 02/02/2018  . Adrenal insufficiency (HCC) 02/02/2018  . Hypothyroidism 02/02/2018  . Type II diabetes mellitus (HCC) 02/02/2018  . Elevated troponin 02/02/2018    Past Surgical History:  Procedure Laterality Date  . EYE SURGERY    . FEMUR IM NAIL Left 02/02/2018  . FEMUR IM NAIL Left 02/02/2018   Procedure: INTRAMEDULLARY (IM) NAIL LEFT FEMUR;  Surgeon: Roby LoftsHaddix, Kevin P, MD;  Location: [facility] OR;  Service: Orthopedics;  Laterality: Left;     OB History   No obstetric history on file.      Home Medications    Prior to Admission medications   Medication Sig Start Date End Date Taking? Authorizing Provider  hydrocortisone (CORTEF) 20 MG tablet Take 1/2 (one-half) tablet by mouth once daily Patient taking differently: Take 10 mg by mouth daily.  12/19/18  Yes Doristine BosworthStallings, Zoe A, MD  levothyroxine (SYNTHROID) 50 MCG tablet Take 1 tablet by mouth once daily 02/25/19  Yes Stallings, Zoe A, MD  Multiple Vitamins-Minerals (CENTRUM SILVER 50+WOMEN) TABS Take 1 tablet by mouth daily.   Yes [provider]  polyethylene glycol (MIRALAX / GLYCOLAX) packet Take 17 g by mouth 2 (two) times daily. (until having soft bowel movement daily, then use as needed or daily) Patient taking differently: Take 17 g by mouth daily as  needed for mild constipation.  02/06/18  Yes Zigmund DanielPowell, A Caldwell Jr., MD  prednisoLONE acetate (PRED FORTE) 1 % ophthalmic suspension Place 1 drop into both eyes 2 (two) times daily. 04/18/18  Yes Stallings, Zoe A, MD  Propylene Glycol (SYSTANE BALANCE OP) Apply 2 drops to eye 3 (three) times daily as needed (dry eyes).    Yes [provider]  sodium chloride (MURO 128) 2 % ophthalmic solution Place 1 drop into both eyes 2 (two) times daily. Patient not taking: Reported on 03/19/2019 04/18/18   Doristine BosworthStallings, Zoe A, MD    Family  History Family History  Problem Relation Age of Onset  . Vitiligo Son     Social History Social History   Tobacco Use  . Smoking status: Former Games developermoker  . Smokeless tobacco: Never Used  Substance Use Topics  . Alcohol use: Never    Frequency: Never  . Drug use: Never     Allergies   Patient has no known allergies.   Review of Systems Review of Systems  Constitutional: Negative for fever.  Cardiovascular: Negative for chest pain.  All other systems reviewed and are negative.    Physical Exam Updated Vital Signs BP (!) 236/79   Pulse 64   Temp (!) 96.4 F (35.8 C) (Axillary)   Resp 11   SpO2 100%   Physical Exam Vitals signs and nursing note reviewed.  Constitutional:      General: She is not in acute distress.    Appearance: She is well-developed.  HENT:     Head: Normocephalic and atraumatic.  Eyes:     Conjunctiva/sclera: Conjunctivae normal.     Pupils: Pupils are equal, round, and reactive to light.  Neck:     Musculoskeletal: Normal range of motion and neck supple.  Cardiovascular:     Rate and Rhythm: Normal rate and regular rhythm.     Heart sounds: Normal heart sounds.  Pulmonary:     Effort: Pulmonary effort is normal. No respiratory distress.     Breath sounds: Normal breath sounds.  Abdominal:     General: There is no distension.     Palpations: Abdomen is soft.     Tenderness: There is no abdominal tenderness.  Musculoskeletal: Normal range of motion.        General: No deformity.  Skin:    General: Skin is warm and dry.  Neurological:     Mental Status: She is alert and oriented to person, place, and time. Mental status is at baseline.     GCS: GCS eye subscore is 4. GCS verbal subscore is 5. GCS motor subscore is 6.     Cranial Nerves: No cranial nerve deficit, dysarthria or facial asymmetry.     Motor: No weakness.      ED Treatments / Results  Labs (all labs ordered are listed, but only abnormal results are displayed) Labs  Reviewed  URINALYSIS, ROUTINE W REFLEX MICROSCOPIC - Abnormal; Notable for the following components:      Result Value   Color, Urine STRAW (*)    Ketones, ur 5 (*)    Leukocytes,Ua MODERATE (*)    All other components within normal limits  COMPREHENSIVE METABOLIC PANEL - Abnormal; Notable for the following components:   Potassium 3.3 (*)    CO2 18 (*)    Creatinine, Ser 1.14 (*)    GFR calc non Af Amer 47 (*)    GFR calc Af Amer 55 (*)    Anion gap 16 (*)  All other components within normal limits  SARS CORONAVIRUS 2  CBC WITH DIFFERENTIAL/PLATELET  TROPONIN I    EKG EKG Interpretation  Date/Time:  Monday March 19 2019 12:02:14 EDT Ventricular Rate:  63 PR Interval:    QRS Duration: 93 QT Interval:  459 QTC Calculation: 470 R Axis:   24 Text Interpretation:  Sinus rhythm Consider right atrial enlargement Probable LVH with secondary repol abnrm Confirmed by Dene Gentry [phone]) on 03/19/2019 12:04:32 PM   Radiology Dg Chest Port 1 View  Result Date: 03/19/2019 CLINICAL DATA:  Shortness of breath.  Weakness. EXAM: PORTABLE CHEST 1 VIEW COMPARISON:  None. FINDINGS: Heart size is normal. Atherosclerotic changes are noted at the aortic arch. There is no edema or effusion. No focal airspace disease is present. The visualized soft tissues and bony thorax are unremarkable. IMPRESSION: 1. No acute cardiopulmonary disease. 2. Aortic atherosclerosis. Electronically Signed   By: San Morelle M.D.   On: 03/19/2019 11:44    Procedures Procedures (including critical care time)  Medications Ordered in ED Medications  hydrALAZINE (APRESOLINE) injection 5 mg (has no administration in time range)  LORazepam (ATIVAN) injection 0.5 mg (0.5 mg Intravenous Given 03/19/19 1145)     Initial Impression / Assessment and Plan / ED Course  I have reviewed the triage vital signs and the nursing notes.  Pertinent labs & imaging results that were available during my care of the patient  were reviewed by me and considered in my medical decision making (see chart for details).        MDM  Screen complete  [name] was evaluated in Emergency Department on 03/19/2019 for the symptoms described in the history of present illness. She was evaluated in the context of the global COVID-19 pandemic, which necessitated consideration that the patient might be at risk for infection with the SARS-CoV-2 virus that causes COVID-19. Institutional protocols and algorithms that pertain to the evaluation of patients at risk for COVID-19 are in a state of rapid change based on information released by regulatory bodies including the CDC and federal and state organizations. These policies and algorithms were followed during the patient's care in the ED.  Patient is presenting for evaluation of elevated blood pressure.  Patient does appear to be significantly anxious upon her arrival.  Communication with the patient is difficult given her difficulties hearing and seeing.  Medication is primarily through the patient's son who is at bedside.  Screening exam and work-up do not reveal significant acute pathology.  Full administration of Ativan and a small dose of hydralazine the patient's blood pressure is improved.  Patient without evidence of endorgan damage secondary to her elevated blood pressure.  Plan of care extensively discussed with the patient's family at bedside.  Admission was offered.  They declined.  Patient and patient's family are advised to follow-up closely with the patient's primary care.    Will start small dose of antihypertensives from the ED for treatment of the patient's elevated pressures.  Importance of close follow-up was repeatedly stressed.  Strict return cautions given and understood.  1600 Patient was preparing to be discharged.  As she was getting out of the stretcher she apparently reported feeling woozy.  Repeat blood pressures demonstrated hypotension.  She was  returned to the stretcher and placed in reverse Trendelenburg.  Pressures improved without significant intervention.  Patient is being administered a small IV fluid bolus at this time.  I suspect that she may have delayed effect from the hydralazine administered IV.  Will reassess after IVF bolus. Repeat EKG is without significant findings.   1700 Patient is improved.  Blood pressures have improved.  She remains slightly hypotensive with standing.  We will continue to office.  I feel that she is most likely very sensitive to the hydralazine that was administered earlier in the ED course.  Mental status is at baseline.  She is without current complaint.  Son of the patient is advised that very close follow-up with the patient's primary care provider is imperative prior to starting antihypertensives.    Final Clinical Impressions(s) / ED Diagnoses   Final diagnoses:  Hypertension, unspecified type    ED Discharge Orders         Ordered    hydrocortisone (CORTEF) 20 MG tablet     03/19/19 1503    hydrochlorothiazide (HYDRODIURIL) 25 MG tablet  Daily     03/19/19 1503           Wynetta FinesMessick, Peter C, MD 03/19/19 1506    Wynetta FinesMessick, Peter C, MD 03/19/19 1612    Wynetta FinesMessick, Peter C, MD 03/19/19 1730

## 2019-03-19 NOTE — Patient Instructions (Signed)
° ° ° °  If you have lab work done today you will be contacted with your lab results within the next 2 weeks.  If you have not heard from us then please contact us. The fastest way to get your results is to register for My Chart. ° ° °IF you received an x-ray today, you will receive an invoice from [facility]. Please contact [facility] at [phone] with questions or concerns regarding your invoice.  ° °IF you received labwork today, you will receive an invoice from [facility]. Please contact [facility] at [phone] with questions or concerns regarding your invoice.  ° °Our billing staff will not be able to assist you with questions regarding bills from these companies. ° °You will be contacted with the lab results as soon as they are available. The fastest way to get your results is to activate your My Chart account. Instructions are located on the last page of this paperwork. If you have not heard from us regarding the results in 2 weeks, please contact this office. °  ° ° ° °

## 2019-03-19 NOTE — ED Notes (Signed)
Son Elsah at bed side and will be staying.

## 2019-03-27 ENCOUNTER — Inpatient Hospital Stay: Payer: Medicare Other | Admitting: Family Medicine

## 2019-03-28 ENCOUNTER — Inpatient Hospital Stay: Payer: Medicare Other | Admitting: Family Medicine

## 2019-04-02 ENCOUNTER — Ambulatory Visit (INDEPENDENT_AMBULATORY_CARE_PROVIDER_SITE_OTHER): Payer: Medicare Other | Admitting: Family Medicine

## 2019-04-02 ENCOUNTER — Other Ambulatory Visit: Payer: Self-pay

## 2019-04-02 VITALS — BP 193/72 | HR 60 | Temp 98.4°F | Resp 17 | Ht 63.5 in | Wt 129.0 lb

## 2019-04-02 DIAGNOSIS — I10 Essential (primary) hypertension: Secondary | ICD-10-CM

## 2019-04-02 DIAGNOSIS — F418 Other specified anxiety disorders: Secondary | ICD-10-CM

## 2019-04-02 MED ORDER — HYDROCHLOROTHIAZIDE 25 MG PO TABS: 25.0000 mg | ORAL_TABLET | Freq: Every day | ORAL | 3 refills | Status: DC

## 2019-04-02 MED ORDER — ESCITALOPRAM OXALATE 10 MG PO TABS: 10.0000 mg | ORAL_TABLET | Freq: Every day | ORAL | 6 refills | Status: DC

## 2019-04-02 MED ORDER — ESCITALOPRAM OXALATE 10 MG PO TABS: 10.0000 mg | ORAL_TABLET | Freq: Every day | ORAL | 3 refills | Status: DC

## 2019-04-02 NOTE — Progress Notes (Signed)
Established Patient Office Visit  Subjective:  Patient ID: [name], female    DOB: 01-Jul-1946  Age: 73 y.o. MRN: [id number]  CC:  Chief Complaint  [name] with  . Hospitalization Follow-up    elevated bp.  Per son pt is doing better but pt bp reading still elevated in office    HPI [name] for ER follow up  She has been having very high bp  She was discharged home on bp meds  June 12 146/65 sitting up June 10 214/87 Her son checks her blood pressure daily.   Patient reports that the roaring sound comes off and on and is no longer there recently and is feeling better.  She is less anxious since the roaring in her ears stopped.  Wt Readings from Last 3 Encounters:  04/02/19 129 lb (58.5 kg)  03/19/19 130 lb (59 kg)  09/21/18 133 lb (60.3 kg)   BP Readings from Last 3 Encounters:  04/02/19 (!) 193/72  03/19/19 (!) 158/72  03/19/19 (!) 183/92     Past Medical History:  Diagnosis Date  . Adrenal insufficiency (Addison's disease) (HCC)   . Hypertension   . Thyroid disease    hypothyroidism  . Type 2 diabetes mellitus (HCC)     Past Surgical History:  Procedure Laterality Date  . EYE SURGERY    . FEMUR IM NAIL Left 02/02/2018  . FEMUR IM NAIL Left 02/02/2018   Procedure: INTRAMEDULLARY (IM) NAIL LEFT FEMUR;  Surgeon: Roby LoftsHaddix, Kevin P, MD;  Location: [facility] OR;  Service: Orthopedics;  Laterality: Left;    Family History  Problem Relation Age of Onset  . Vitiligo Son     Social History   Socioeconomic History  . Marital status: Divorced    Spouse name: Not on file  . Number of children: Not on file  . Years of education: Not on file  . Highest education level: Not on file  Occupational History  . Not on file  Social Needs  . Financial resource strain: Not on file  . Food insecurity    Worry: Not on file    Inability: Not on file  . Transportation needs    Medical: Not on file    Non-medical: Not on file  Tobacco Use  .  Smoking status: Former Games developermoker  . Smokeless tobacco: Never Used  Substance and Sexual Activity  . Alcohol use: Never    Frequency: Never  . Drug use: Never  . Sexual activity: Not on file  Lifestyle  . Physical activity    Days per week: Not on file    Minutes per session: Not on file  . Stress: Not on file  Relationships  . Social Musicianconnections    Talks on phone: Not on file    Gets together: Not on file    Attends religious service: Not on file    Active member of club or organization: Not on file    Attends meetings of clubs or organizations: Not on file    Relationship status: Not on file  . Intimate partner violence    Fear of current or ex partner: Not on file    Emotionally abused: Not on file    Physically abused: Not on file    Forced sexual activity: Not on file  Other Topics Concern  . Not on file  Social History Narrative  . Not on file    Outpatient Medications Prior to Visit  Medication Sig Dispense Refill  .  Multiple Vitamins-Minerals (CENTRUM SILVER 50+WOMEN) TABS Take 1 tablet by mouth daily.    . polyethylene glycol (MIRALAX / GLYCOLAX) packet Take 17 g by mouth 2 (two) times daily. (until having soft bowel movement daily, then use as needed or daily) (Patient taking differently: Take 17 g by mouth daily as needed for mild constipation. ) 14 each 0  . prednisoLONE acetate (PRED FORTE) 1 % ophthalmic suspension Place 1 drop into both eyes 2 (two) times daily. 5 mL 3  . Propylene Glycol (SYSTANE BALANCE OP) Apply 2 drops to eye 3 (three) times daily as needed (dry eyes).     . sodium chloride (MURO 128) 2 % ophthalmic solution Place 1 drop into both eyes 2 (two) times daily. 15 mL 0  . hydrochlorothiazide (HYDRODIURIL) 25 MG tablet Take 1 tablet (25 mg total) by mouth daily. 30 tablet 0  . hydrocortisone (CORTEF) 20 MG tablet Take 1/2 (one-half) tablet by mouth once daily 30 tablet 0  . levothyroxine (SYNTHROID) 50 MCG tablet Take 1 tablet by mouth once daily 90  tablet 0   No facility-administered medications prior to visit.     No Known Allergies  ROS Review of Systems Review of Systems  Constitutional: Negative for activity change, appetite change, chills and fever.  HENT: Negative for congestion, nosebleeds, trouble swallowing and voice change.   Respiratory: Negative for cough, shortness of breath and wheezing.   Gastrointestinal: Negative for diarrhea, nausea and vomiting.  Genitourinary: Negative for difficulty urinating, dysuria, flank pain and hematuria.  Musculoskeletal: Negative for back pain, joint swelling and neck pain.  Neurological: see hpi See HPI. All other review of systems negative.     Objective:    Physical Exam  BP (!) 193/72 (BP Location: Left Arm, Patient Position: Sitting, Cuff Size: Normal)   Pulse 60   Temp 98.4 F (36.9 C) (Oral)   Resp 17   Ht 5' 3.5" (1.613 m)   Wt 129 lb (58.5 kg)   SpO2 100%   BMI 22.49 kg/m  Wt Readings from Last 3 Encounters:  04/02/19 129 lb (58.5 kg)  03/19/19 130 lb (59 kg)  09/21/18 133 lb (60.3 kg)   Physical Exam  Constitutional: Oriented to person, place, and time. Appears well-developed and well-nourished.  HENT: (patient is deaf and legally blind) Head: Normocephalic and atraumatic.  Eyes: Conjunctivae and EOM are normal.  Cardiovascular: Normal rate, regular rhythm, normal heart sounds and intact distal pulses.  No murmur heard. Pulmonary/Chest: Effort normal and breath sounds normal. No stridor. No respiratory distress. Has no wheezes.  Neurological: Is alert and oriented to person, place, and time.  Skin: Skin is warm. Capillary refill takes less than 2 seconds.  Psychiatric: Has a normal mood and affect. Behavior is normal. Judgment and thought content normal.    Health Maintenance Due  Topic Date Due  . OPHTHALMOLOGY EXAM  12/31/1954  . URINE MICROALBUMIN  12/31/1954  . TETANUS/TDAP  12/31/1963  . COLONOSCOPY  12/31/1994  . DEXA SCAN  12/30/2009  . PNA  vac Low Risk Adult (1 of 2 - PCV13) 12/30/2009  . MAMMOGRAM  08/26/2011  . FOOT EXAM  03/10/2019  . INFLUENZA VACCINE  05/12/2019  . HEMOGLOBIN A1C  06/06/2019    There are no preventive care reminders to display for this patient.  Lab Results  Component Value Date   TSH 0.007 (L) 12/06/2018   Lab Results  Component Value Date   WBC 9.1 03/19/2019   HGB 13.7 03/19/2019  HCT 41.1 03/19/2019   MCV 80.7 03/19/2019   PLT 240 03/19/2019   Lab Results  Component Value Date   NA 141 03/19/2019   K 3.3 (L) 03/19/2019   CO2 18 (L) 03/19/2019   GLUCOSE 83 03/19/2019   BUN 14 03/19/2019   CREATININE 1.14 (H) 03/19/2019   BILITOT 0.7 03/19/2019   ALKPHOS 62 03/19/2019   AST 28 03/19/2019   ALT 12 03/19/2019   PROT 6.7 03/19/2019   ALBUMIN 4.0 03/19/2019   CALCIUM 9.6 03/19/2019   ANIONGAP 16 (H) 03/19/2019   Lab Results  Component Value Date   CHOL 185 05/04/2018   Lab Results  Component Value Date   HDL 56 05/04/2018   Lab Results  Component Value Date   LDLCALC 112 (H) 05/04/2018   Lab Results  Component Value Date   TRIG 85 05/04/2018   Lab Results  Component Value Date   CHOLHDL 3.3 05/04/2018   Lab Results  Component Value Date   HGBA1C 5.8 (H) 12/06/2018   COMPARISON:  February 02, 2018  FINDINGS: Brain: Ventricles and sulci appear within normal limits for age. There is a cavum septum pellucidum, an anatomic variant. There is no intracranial mass, hemorrhage, extra-axial fluid collection, or midline shift. The brain parenchyma appears unremarkable. No acute infarct evident.  Vascular: There is no appreciable hyperdense vessel. There is calcification in each carotid siphon region.  Skull: The bony calvarium appears intact.  Sinuses/Orbits: There is mucosal thickening in the posterior left maxillary antrum. There is mucosal thickening in several ethmoid air cells. Orbits appear symmetric bilaterally except for evidence of previous cataract  surgery on the right.  Other: Mastoid air cells are clear.  IMPRESSION: Brain parenchyma appears unremarkable.  No mass or hemorrhage.  There are foci of arterial vascular calcification. There are foci of paranasal sinus disease.   Electronically Signed   By: Lowella Grip III M.D.   On: 03/19/2019 12:51    Assessment & Plan:   Problem List Items Addressed This Visit    None    Visit Diagnoses    White coat syndrome with hypertension    -  Primary   Relevant Medications   hydrochlorothiazide (HYDRODIURIL) 25 MG tablet   Anxiety about health       Relevant Medications   escitalopram (LEXAPRO) 10 MG tablet   Essential hypertension       Relevant Medications   hydrochlorothiazide (HYDRODIURIL) 25 MG tablet    will continue current meds as when her anxiety is less her blood pressure is better Continue ambulatory monitoring   Meds ordered this encounter  Medications  . DISCONTD: escitalopram (LEXAPRO) 10 MG tablet    Sig: Take 1 tablet (10 mg total) by mouth daily.    Dispense:  30 tablet    Refill:  6  . hydrochlorothiazide (HYDRODIURIL) 25 MG tablet    Sig: Take 1 tablet (25 mg total) by mouth daily.    Dispense:  30 tablet    Refill:  3  . escitalopram (LEXAPRO) 10 MG tablet    Sig: Take 1 tablet (10 mg total) by mouth daily.    Dispense:  30 tablet    Refill:  3    Follow-up: No follow-ups on file.    Forrest Moron, MD

## 2019-04-02 NOTE — Patient Instructions (Addendum)
  Follow up in 3 months for hypertension   Check blood pressures and try to maintain blood pressure between 140s and 160s over 60s to 90s   If you have lab work done today you will be contacted with your lab results within the next 2 weeks.  If you have not heard from [place] then please contact us. The fastest way to get your results is to register for My Chart.   IF you received an x-ray today, you will receive an invoice from [facility] Radiology. Please contact [facility] at [phone] with questions or concerns regarding your invoice.   IF you received labwork today, you will receive an invoice from [facility]. Please contact [facility] at [phone] with questions or concerns regarding your invoice.   Our billing staff will not be able to assist you with questions regarding bills from these companies.  You will be contacted with the lab results as soon as they are available. The fastest way to get your results is to activate your My Chart account. Instructions are located on the last page of this paperwork. If you have not heard from [place] regarding the results in 2 weeks, please contact this office.

## 2019-04-05 ENCOUNTER — Telehealth: Payer: Self-pay | Admitting: Family Medicine

## 2019-04-05 NOTE — Telephone Encounter (Signed)
Copied from [place] [id number]. Topic: General - Other >> Apr 05, 2019  9:12 AM Carolyn Stare wrote: Pt son Shawn call to say pt is not going to take this med escitalopram (LEXAPRO) 10 MG tablet  is made her heart beat fast made her not feel good, will like to know how long this med will stay in system she has only taken one pill and that was Tuesday

## 2019-04-10 NOTE — Telephone Encounter (Signed)
Son would like to know if he should continue to give the pt her bp med hydrochlorothiazide (HYDRODIURIL) 25 MG tablet  He states her bp is 140/80. He wants to know since it is fairly good, should he continue to give?  He took while I was on phone, it was 102/54.  Pt has no other sx.  But if he gives today, he is concerned bp will get lower. Please call back at [phone]

## 2019-04-26 ENCOUNTER — Encounter: Payer: Self-pay | Admitting: *Deleted

## 2019-05-16 NOTE — Telephone Encounter (Signed)
Can we please check in with Shawn to see how the blood pressure is doing?  Please call my cell phone with the blood pressure reading.

## 2019-05-18 NOTE — Telephone Encounter (Signed)
The son of pt, [name], called [place] back and he has informed us that they have stop giving mom the BP medication on 04/26/19.  Here is the Log from 04/26/19 after medication was stopped:  7/16: @ 4PM- 197/83 7/17: @ 11AM- 190/53  @ 2PM- 157/75  @ 5:30PM- 182/83 7/20: @ 2:30PM- 135/58 7/21: @ 2PM- 163/70  @ 5PM- 157/76 7/23: @ 1PM- 140/67 7/27: @ 2PM- 170/79 7/31: @ 4PM- 173/78 8/4: @ 5PM- 199/92 8/7: @2PM - 151/71  Bring everything she is taking and BP cuff to the next nurse visit appt that Dr. Nolon Rod is in the office.   We will make a nurse visit appointment Monday or Tuesday Afternoon.   I have attempted to call pt to schedule the nurse visit. There was no answer; so I left a general message to call back.   Plan: If pt calls back please schedule pt for a nurse visit for BP check on Monday 05/21/19 or Tuesday 05/22/19 Afternoon per stalling.

## 2019-05-18 NOTE — Telephone Encounter (Signed)
I have called the pt's son and asked him to check the pt BP and give [place] the results.   Pt son Raquel Sarna stated understanding and he will do that for [place]. He will call to give [place] the reading today.   His number is [phone]

## 2019-05-21 ENCOUNTER — Telehealth: Payer: Self-pay | Admitting: Family Medicine

## 2019-05-21 NOTE — Telephone Encounter (Signed)
Copied from CRM [id number]. Topic: Quick Communication - Rx Refill/Question °>> May 21, 2019 10:00 AM Coley, Sarah wrote: °Medication:hydrocortisone (CORTEF) 20 MG tablet  ° °Has the patient contacted their pharmacy? Yes. Pt is completely out of med ° °(Agent: If yes, when and what did the pharmacy advise?) ° °Preferred Pharmacy (with phone number or street name): [id number] - [place] ([place]), [place] - [address] [phone] (Phone) °[phone] (Fax) ° ° ° °Agent: Please be advised that RX refills may take up to 3 business days. We ask that you follow-up with your pharmacy. °

## 2019-05-21 NOTE — Telephone Encounter (Signed)
Copied from [place] [id number]. Topic: Quick Communication - Rx Refill/Question >> May 21, 2019 10:00 AM Parke Poisson wrote: Medication:hydrocortisone (CORTEF) 20 MG tablet   Has the patient contacted their pharmacy? Yes. Pt is completely out of med  (Agent: If yes, when and what did the pharmacy advise?)  Preferred Pharmacy (with phone number or street name): [facility] ([address]), [place] [phone] (Phone) [phone] (Fax)    Agent: Please be advised that RX refills may take up to 3 business days. We ask that you follow-up with your pharmacy.

## 2019-05-24 MED ORDER — HYDROCORTISONE 20 MG PO TABS: ORAL_TABLET | ORAL | 3 refills | Status: DC

## 2019-05-24 NOTE — Telephone Encounter (Signed)
Will route to stall[place] to address.

## 2019-05-24 NOTE — Telephone Encounter (Signed)
Will route to dr Nolon Rod to address.

## 2019-05-24 NOTE — Telephone Encounter (Signed)
Left message on the voicemail of pt's son Alverda Skeans that requested prescription was sent to pharmacy.

## 2019-05-24 NOTE — Telephone Encounter (Signed)
Please let the patient know that the refill was sent in.

## 2019-05-27 ENCOUNTER — Other Ambulatory Visit: Payer: Self-pay | Admitting: Family Medicine

## 2019-05-28 NOTE — Telephone Encounter (Signed)
Pts son called stating pt is out of the medication. He states she will begin to panic if she does not get this medication. Please advise.

## 2019-07-03 ENCOUNTER — Ambulatory Visit: Payer: Medicare Other | Admitting: Family Medicine

## 2019-07-03 ENCOUNTER — Encounter: Payer: Self-pay | Admitting: Family Medicine

## 2019-07-03 ENCOUNTER — Other Ambulatory Visit: Payer: Self-pay

## 2019-07-03 ENCOUNTER — Ambulatory Visit (INDEPENDENT_AMBULATORY_CARE_PROVIDER_SITE_OTHER): Payer: Medicare Other | Admitting: Family Medicine

## 2019-07-03 VITALS — BP 142/68 | HR 65 | Temp 98.2°F | Resp 17 | Ht 63.5 in | Wt 130.6 lb

## 2019-07-03 DIAGNOSIS — Z0100 Encounter for examination of eyes and vision without abnormal findings: Secondary | ICD-10-CM | POA: Diagnosis not present

## 2019-07-03 DIAGNOSIS — Z1211 Encounter for screening for malignant neoplasm of colon: Secondary | ICD-10-CM | POA: Diagnosis not present

## 2019-07-03 DIAGNOSIS — E2740 Unspecified adrenocortical insufficiency: Secondary | ICD-10-CM

## 2019-07-03 DIAGNOSIS — E119 Type 2 diabetes mellitus without complications: Secondary | ICD-10-CM

## 2019-07-03 DIAGNOSIS — Z23 Encounter for immunization: Secondary | ICD-10-CM

## 2019-07-03 DIAGNOSIS — E0580 Other thyrotoxicosis without thyrotoxic crisis or storm: Secondary | ICD-10-CM

## 2019-07-03 DIAGNOSIS — E1169 Type 2 diabetes mellitus with other specified complication: Secondary | ICD-10-CM

## 2019-07-03 DIAGNOSIS — E2839 Other primary ovarian failure: Secondary | ICD-10-CM | POA: Diagnosis not present

## 2019-07-03 DIAGNOSIS — Z1239 Encounter for other screening for malignant neoplasm of breast: Secondary | ICD-10-CM

## 2019-07-03 DIAGNOSIS — E039 Hypothyroidism, unspecified: Secondary | ICD-10-CM | POA: Diagnosis not present

## 2019-07-03 DIAGNOSIS — I10 Essential (primary) hypertension: Secondary | ICD-10-CM

## 2019-07-03 DIAGNOSIS — Z13820 Encounter for screening for osteoporosis: Secondary | ICD-10-CM

## 2019-07-03 NOTE — Progress Notes (Signed)
Established Patient Office Visit  Subjective:  Patient ID: [name], female    DOB: December 20, 1945  Age: 73 y.o. MRN: [id number]  CC:  Chief Complaint  Patient presents with  . Medical Management of Chronic Issues    3 month f/u  . Medication Refill    levothyroxine and sodium chloride 2% ophthalmic solution    HPI [name] presents for   Follow up for thyroid and hypertension She is legally blind and deaf. Communication is by writing very  Large bold letters on paper.  Hypertension: Patient here for follow-up of elevated blood pressure. She is not exercising and is adherent to low salt diet.  Blood pressure is well controlled at home but there are some high readings in the 180s but not very often. She dneis chest pains. Cardiovascular risk factors: dyslipidemia and hypertension. Use of agents associated with hypertension: steroids and thyroid hormones. History of target organ damage: none  BP Readings from Last 3 Encounters:  07/03/19 (!) 142/68  04/02/19 (!) 193/72  03/19/19 (!) 158/72    Hypothyroidism/Adrenal Insufficiency Pt with caregiver. She is taking the meds and reports feeling fine No fractures No dry mouth or fluid retention Lab Results  Component Value Date   TSH 0.007 (L) 12/06/2018   Lab Results  Component Value Date   TSH 0.007 (L) 07/03/2019    She takes her medications every day. She states that she feels fine and does not understand why the levels are so off.  She continues to take her cortisol for her adrenal insufficiency.   Anxiety Her anxiety has improved. No more palpitations. Her adult children are involved in her care and she mostly stays home during the pandemic.   She has not had her health screenings for breast cancer and osteoporosis She will discuss with her sons.    Past Medical History:  Diagnosis Date  . Adrenal insufficiency (Addison's disease) (HCC)   . Hypertension   . Thyroid disease    hypothyroidism  . Type 2  diabetes mellitus (HCC)     Past Surgical History:  Procedure Laterality Date  . EYE SURGERY    . FEMUR IM NAIL Left 02/02/2018  . FEMUR IM NAIL Left 02/02/2018   Procedure: INTRAMEDULLARY (IM) NAIL LEFT FEMUR;  Surgeon: Roby Lofts, MD;  Location: [facility] OR;  Service: Orthopedics;  Laterality: Left;    Family History  Problem Relation Age of Onset  . Vitiligo Son     Social History   Socioeconomic History  . Marital status: Divorced    Spouse name: Not on file  . Number of children: Not on file  . Years of education: Not on file  . Highest education level: Not on file  Occupational History  . Not on file  Social Needs  . Financial resource strain: Not on file  . Food insecurity    Worry: Not on file    Inability: Not on file  . Transportation needs    Medical: Not on file    Non-medical: Not on file  Tobacco Use  . Smoking status: Former Games developer  . Smokeless tobacco: Never Used  Substance and Sexual Activity  . Alcohol use: Never    Frequency: Never  . Drug use: Never  . Sexual activity: Not on file  Lifestyle  . Physical activity    Days per week: Not on file    Minutes per session: Not on file  . Stress: Not on file  Relationships  . Social connections  Talks on phone: Not on file    Gets together: Not on file    Attends religious service: Not on file    Active member of club or organization: Not on file    Attends meetings of clubs or organizations: Not on file    Relationship status: Not on file  . Intimate partner violence    Fear of current or ex partner: Not on file    Emotionally abused: Not on file    Physically abused: Not on file    Forced sexual activity: Not on file  Other Topics Concern  . Not on file  Social History Narrative  . Not on file    Outpatient Medications Prior to Visit  Medication Sig Dispense Refill  . escitalopram (LEXAPRO) 10 MG tablet Take 1 tablet (10 mg total) by mouth daily. 30 tablet 3  . hydrochlorothiazide  (HYDRODIURIL) 25 MG tablet Take 1 tablet (25 mg total) by mouth daily. 30 tablet 3  . hydrocortisone (CORTEF) 20 MG tablet Take 1/2 (one-half) tablet by mouth once daily 45 tablet 3  . levothyroxine (EUTHYROX) 50 MCG tablet Take 1 tablet (50 mcg total) by mouth daily. Please keep upcoming appt 90 tablet 0  . Multiple Vitamins-Minerals (CENTRUM SILVER 50+WOMEN) TABS Take 1 tablet by mouth daily.    . polyethylene glycol (MIRALAX / GLYCOLAX) packet Take 17 g by mouth 2 (two) times daily. (until having soft bowel movement daily, then use as needed or daily) (Patient taking differently: Take 17 g by mouth daily as needed for mild constipation. ) 14 each 0  . prednisoLONE acetate (PRED FORTE) 1 % ophthalmic suspension Place 1 drop into both eyes 2 (two) times daily. 5 mL 3  . Propylene Glycol (SYSTANE BALANCE OP) Apply 2 drops to eye 3 (three) times daily as needed (dry eyes).     . sodium chloride (MURO 128) 2 % ophthalmic solution Place 1 drop into both eyes 2 (two) times daily. 15 mL 0   No facility-administered medications prior to visit.     No Known Allergies  ROS Review of Systems Review of Systems  Constitutional: Negative for activity change, appetite change, chills and fever.  HENT: Negative for congestion, nosebleeds, trouble swallowing and voice change.   Respiratory: Negative for cough, shortness of breath and wheezing.   Gastrointestinal: Negative for diarrhea, nausea and vomiting.  Genitourinary: Negative for difficulty urinating, dysuria, flank pain and hematuria.  Musculoskeletal: Negative for back pain, joint swelling and neck pain.  Neurological: Negative for dizziness, speech difficulty, light-headedness and numbness.  See HPI. All other review of systems negative.     Objective:    Physical Exam  BP (!) 142/68 (BP Location: Left Arm, Patient Position: Sitting, Cuff Size: Normal)   Pulse 65   Temp 98.2 F (36.8 C) (Oral)   Resp 17   Ht 5' 3.5" (1.613 m)   Wt 130 lb  9.6 oz (59.2 kg)   SpO2 100%   BMI 22.77 kg/m  Wt Readings from Last 3 Encounters:  07/03/19 130 lb 9.6 oz (59.2 kg)  04/02/19 129 lb (58.5 kg)  03/19/19 130 lb (59 kg)   Physical Exam  Constitutional: Oriented to person, place, and time. Appears well-developed and well-nourished.  HENT:  Head: Normocephalic and atraumatic.  Eyes: Conjunctivae and EOM are normal.  Cardiovascular: Normal rate, regular rhythm, normal heart sounds and intact distal pulses.  No murmur heard. Pulmonary/Chest: Effort normal and breath sounds normal. No stridor. No respiratory distress. Has no  wheezes.  Neurological: Is alert and oriented to person, place, and time.  Skin: Skin is warm. Capillary refill takes less than 2 seconds.  Psychiatric: Has a normal mood and affect. Behavior is normal. Judgment and thought content normal.    Health Maintenance Due  Topic Date Due  . OPHTHALMOLOGY EXAM  12/31/1954  . URINE MICROALBUMIN  12/31/1954  . TETANUS/TDAP  12/31/1963  . COLONOSCOPY  12/31/1994  . DEXA SCAN  12/30/2009  . PNA vac Low Risk Adult (1 of 2 - PCV13) 12/30/2009  . MAMMOGRAM  08/26/2011  . FOOT EXAM  03/10/2019  . INFLUENZA VACCINE  05/12/2019  . HEMOGLOBIN A1C  06/06/2019    There are no preventive care reminders to display for this patient.  Lab Results  Component Value Date   TSH 0.007 (L) 12/06/2018   Lab Results  Component Value Date   WBC 9.1 03/19/2019   HGB 13.7 03/19/2019   HCT 41.1 03/19/2019   MCV 80.7 03/19/2019   PLT 240 03/19/2019   Lab Results  Component Value Date   NA 141 03/19/2019   K 3.3 (L) 03/19/2019   CO2 18 (L) 03/19/2019   GLUCOSE 83 03/19/2019   BUN 14 03/19/2019   CREATININE 1.14 (H) 03/19/2019   BILITOT 0.7 03/19/2019   ALKPHOS 62 03/19/2019   AST 28 03/19/2019   ALT 12 03/19/2019   PROT 6.7 03/19/2019   ALBUMIN 4.0 03/19/2019   CALCIUM 9.6 03/19/2019   ANIONGAP 16 (H) 03/19/2019   Lab Results  Component Value Date   CHOL 185 05/04/2018    Lab Results  Component Value Date   HDL 56 05/04/2018   Lab Results  Component Value Date   LDLCALC 112 (H) 05/04/2018   Lab Results  Component Value Date   TRIG 85 05/04/2018   Lab Results  Component Value Date   CHOLHDL 3.3 05/04/2018   Lab Results  Component Value Date   HGBA1C 5.8 (H) 12/06/2018      Assessment & Plan:   Problem List Items Addressed This Visit      Endocrine   Adrenal insufficiency ([place]) - will continue current steroid Will assess for bone density    Relevant Orders   DG Bone Density   TSH + free T4   Hypothyroidism Iatrogenic hyperthyroidism - pt on lowest possible dose of levothyroxine and shows no improvement in tsh Still in hyperthyroid range Will d/c levothyroxine and reassess in six months   Relevant Orders   TSH + free T4   Type II diabetes mellitus ([place]) - Primary - well controlled  Continue current meds    Relevant Orders   Microalbumin, urine   HM Diabetes Foot Exam (Completed)    Other Visit Diagnoses    Screening for osteoporosis       Relevant Orders   DG Bone Density   Screening for colon cancer       Diabetic eye exam ([place])       Screening for breast cancer       Relevant Orders   MM Digital Screening   Essential hypertension    - bp improved on current regimen    Relevant Orders   Lipid panel   Need for prophylactic vaccination and inoculation against influenza       Estrogen deficiency       Relevant Orders   DG Bone Density      No orders of the defined types were placed in this encounter.   Follow-up: No  follow-ups on file.    Forrest Moron, MD

## 2019-07-03 NOTE — Patient Instructions (Addendum)
Patient with thyroid disease and adrenal insufficiency are at increased risk of low bone density  I have placed a referral to [place] She can get her mammogram and bone density done at the same time to screen for breast cancer and osteoporosis. She would be required to wear a mask and there is very little risk of contracting covid as the utmost care is taken.  This is something I am HIGHLY RECOMMENDING.   She is due for the flu vaccine and pneumonia vaccine Please read on CDC.GOV about both  I checked her thyroid, kidneys, liver and cholesterol today Her blood pressure is very good and she should continue the meds she is taking. I sent in refills.   If you have lab work done today you will be contacted with your lab results within the next 2 weeks.  If you have not heard from [place] then please contact us. The fastest way to get your results is to register for My Chart.   IF you received an x-ray today, you will receive an invoice from [facility]. Please contact [facility] at [phone] with questions or concerns regarding your invoice.   IF you received labwork today, you will receive an invoice from [facility]. Please contact [facility] at [phone] with questions or concerns regarding your invoice.   Our billing staff will not be able to assist you with questions regarding bills from these companies.  You will be contacted with the lab results as soon as they are available. The fastest way to get your results is to activate your My Chart account. Instructions are located on the last page of this paperwork. If you have not heard from [place] regarding the results in 2 weeks, please contact this office.     Osteopenia  Osteopenia is a loss of thickness (density) inside of the bones. Another name for osteopenia is low bone mass. Mild osteopenia is a normal part of aging. It is not a disease, and it does not cause symptoms. However, if you have osteopenia and  continue to lose bone mass, you could develop a condition that causes the bones to become thin and break more easily (osteoporosis). You may also lose some height, have back pain, and have a stooped posture. Although osteopenia is not a disease, making changes to your lifestyle and diet can help to prevent osteopenia from developing into osteoporosis. What are the causes? Osteopenia is caused by loss of calcium in the bones.  Bones are constantly changing. Old bone cells are continually being replaced with new bone cells. This process builds new bone. The mineral calcium is needed to build new bone and maintain bone density. Bone density is usually highest around age 37. After that, most people's bodies cannot replace all the bone they have lost with new bone. What increases the risk? You are more likely to develop this condition if:  You are older than age 33.  You are a woman who went through menopause early.  You have a long illness that keeps you in bed.  You do not get enough exercise.  You lack certain nutrients (malnutrition).  You have an overactive thyroid gland (hyperthyroidism).  You smoke.  You drink a lot of alcohol.  You are taking medicines that weaken the bones, such as steroids. What are the signs or symptoms? This condition does not cause any symptoms. You may have a slightly higher risk for bone breaks (fractures), so getting fractures more easily than normal may be an indication  of osteopenia. How is this diagnosed? Your health care provider can diagnose this condition with a special type of X-ray exam that measures bone density (dual-energy X-ray absorptiometry, DEXA). This test can measure bone density in your hips, spine, and wrists. Osteopenia has no symptoms, so this condition is usually diagnosed after a routine bone density screening test is done for osteoporosis. This routine screening is usually done for:  Women who are age 105 or older.  Men who are age 26  or older. If you have risk factors for osteopenia, you may have the screening test at an earlier age. How is this treated? Making dietary and lifestyle changes can lower your risk for osteoporosis. If you have severe osteopenia that is close to becoming osteoporosis, your health care provider may prescribe medicines and dietary supplements such as calcium and vitamin D. These supplements help to rebuild bone density. Follow these instructions at home:   Take over-the-counter and prescription medicines only as told by your health care provider. These include vitamins and supplements.  Eat a diet that is high in calcium and vitamin D. ? Calcium is found in dairy products, beans, salmon, and leafy green vegetables like spinach and broccoli. ? Look for foods that have vitamin D and calcium added to them (fortified foods), such as orange juice, cereal, and bread.  Do 30 or more minutes of a weight-bearing exercise every day, such as walking, jogging, or playing a sport. These types of exercises strengthen the bones.  Take precautions at home to lower your risk of falling, such as: ? Keeping rooms well-lit and free of clutter, such as cords. ? Installing safety rails on stairs. ? Using rubber mats in the bathroom or other areas that are often wet or slippery.  Do not use any products that contain nicotine or tobacco, such as cigarettes and e-cigarettes. If you need help quitting, ask your health care provider.  Avoid alcohol or limit alcohol intake to no more than 1 drink a day for nonpregnant women and 2 drinks a day for men. One drink equals 12 oz of beer, 5 oz of wine, or 1 oz of hard liquor.  Keep all follow-up visits as told by your health care provider. This is important. Contact a health care provider if:  You have not had a bone density screening for osteoporosis and you are: ? A woman, age 68 or older. ? A man, age 49 or older.  You are a postmenopausal woman who has not had a bone  density screening for osteoporosis.  You are older than age 33 and you want to know if you should have bone density screening for osteoporosis. Summary  Osteopenia is a loss of thickness (density) inside of the bones. Another name for osteopenia is low bone mass.  Osteopenia is not a disease, but it may increase your risk for a condition that causes the bones to become thin and break more easily (osteoporosis).  You may be at risk for osteopenia if you are older than age 63 or if you are a woman who went through early menopause.  Osteopenia does not cause any symptoms, but it can be diagnosed with a bone density screening test.  Dietary and lifestyle changes are the first treatment for osteopenia. These may lower your risk for osteoporosis. This information is not intended to replace advice given to you by your health care provider. Make sure you discuss any questions you have with your health care provider. Document Released: 07/06/2017 Document Revised:  09/09/2017 Document Reviewed: 07/06/2017 Elsevier Patient Education  2020 [employer].

## 2019-07-04 ENCOUNTER — Telehealth: Payer: Self-pay | Admitting: Family Medicine

## 2019-07-04 LAB — LIPID PANEL
Chol/HDL Ratio: 4.1 ratio (ref 0.0–4.4)
Cholesterol, Total: 189 mg/dL (ref 100–199)
HDL: 46 mg/dL (ref >39)
LDL Chol Calc (NIH): 120 mg/dL — ABNORMAL HIGH (ref 0–99)
Triglycerides: 131 mg/dL (ref 0–149)
VLDL Cholesterol Cal: 23 mg/dL (ref 5–40)

## 2019-07-04 LAB — TSH+FREE T4
Free T4: 0.83 ng/dL (ref 0.82–1.77)
TSH: 0.007 u[IU]/mL — ABNORMAL LOW (ref 0.450–4.500)

## 2019-07-04 LAB — MICROALBUMIN, URINE: Microalbumin, Urine: 90.2 ug/mL

## 2019-07-04 MED ORDER — ESCITALOPRAM OXALATE 10 MG PO TABS: 10.0000 mg | ORAL_TABLET | Freq: Every day | ORAL | 3 refills | Status: DC

## 2019-07-04 MED ORDER — HYDROCORTISONE 20 MG PO TABS: ORAL_TABLET | ORAL | 3 refills | Status: DC

## 2019-07-04 MED ORDER — HYDROCHLOROTHIAZIDE 25 MG PO TABS: 25.0000 mg | ORAL_TABLET | Freq: Every day | ORAL | 3 refills | Status: DC

## 2019-07-04 NOTE — Telephone Encounter (Signed)
07/04/2019 at 847am. Left voicemail to let the patient know that her thyroid is in the hyperthyroid range. She does not need to continue the levothyroxine but should continue all her other medications.  The rest of the medications were refilled. Follow up in six months.

## 2019-07-16 ENCOUNTER — Ambulatory Visit (INDEPENDENT_AMBULATORY_CARE_PROVIDER_SITE_OTHER): Payer: Medicare Other | Admitting: Family Medicine

## 2019-07-16 VITALS — BP 142/68 | Ht 63.5 in | Wt 130.0 lb

## 2019-07-16 DIAGNOSIS — Z0000 Encounter for general adult medical examination without abnormal findings: Secondary | ICD-10-CM

## 2019-07-16 NOTE — Progress Notes (Signed)
Presents today for [employer] Visit   Date of last exam: 07/03/2019  Interpreter used for this visit? No  I connected with  [name] on 07/16/19 by a telephone  and verified that I am speaking with the correct person using two identifiers.   I discussed the limitations of evaluation and management by telemedicine. The patient expressed understanding and agreed to proceed.    Patient Care Team: Doristine Bosworth, MD as PCP - General (Internal Medicine)   Other items to address today:   Patient declined FLU Discussed immunizations Patient will discuss at follow up with Dr. Creta Levin 12-31-2018 8:00 am Used a relay phone system    Other Screening: Last screening for diabetes: 12/06/2018 Last lipid screening: 07/03/2019  ADVANCE DIRECTIVES: Discussed: yes On File: no Materials Provided: yes (mailed)  Immunization status:   There is no immunization history on file for this patient.   Health Maintenance Due  Topic Date Due  . OPHTHALMOLOGY EXAM  12/31/1954  . TETANUS/TDAP  12/31/1963  . COLONOSCOPY  12/31/1994  . DEXA SCAN  12/30/2009  . PNA vac Low Risk Adult (1 of 2 - PCV13) 12/30/2009  . MAMMOGRAM  08/26/2011  . INFLUENZA VACCINE  05/12/2019  . HEMOGLOBIN A1C  06/06/2019     Functional Status Survey: Is the patient deaf or have difficulty hearing?: (uses a relay service on phone) Does the patient have difficulty seeing, even when wearing glasses/contacts?: No Does the patient have difficulty concentrating, remembering, or making decisions?: No Does the patient have difficulty walking or climbing stairs?: No Does the patient have difficulty dressing or bathing?: No Does the patient have difficulty doing errands alone such as visiting a doctor's office or shopping?: No   No flowsheet data found.      Clinical Support from 07/16/2019 in [facility] Care at [facility]  AUDIT-C Score  0       Home Environment:   Lives in one story home  has caregiver  Patient has a relay service for questions No scattered rugs No grab bars Adequate lighting/no clutter   Patient Active Problem List   Diagnosis Date Noted  . Anemia 02/02/2018  . Thrombocytopenia (HCC) 02/02/2018  . Hypokalemia 02/02/2018  . Hypomagnesemia 02/02/2018  . Femur fracture, left (HCC) 02/02/2018  . Adrenal insufficiency (HCC) 02/02/2018  . Hypothyroidism 02/02/2018  . Type II diabetes mellitus (HCC) 02/02/2018  . Elevated troponin 02/02/2018     Past Medical History:  Diagnosis Date  . Adrenal insufficiency (Addison's disease) (HCC)   . Hypertension   . Thyroid disease    hypothyroidism  . Type 2 diabetes mellitus (HCC)      Past Surgical History:  Procedure Laterality Date  . EYE SURGERY    . FEMUR IM NAIL Left 02/02/2018  . FEMUR IM NAIL Left 02/02/2018   Procedure: INTRAMEDULLARY (IM) NAIL LEFT FEMUR;  Surgeon: Roby Lofts, MD;  Location: [facility] OR;  Service: Orthopedics;  Laterality: Left;     Family History  Problem Relation Age of Onset  . Vitiligo Son      Social History   Socioeconomic History  . Marital status: Divorced    Spouse name: Not on file  . Number of children: Not on file  . Years of education: Not on file  . Highest education level: Not on file  Occupational History  . Not on file  Social Needs  . Financial resource strain: Not on file  . Food insecurity    Worry:  Not on file    Inability: Not on file  . Transportation needs    Medical: Not on file    Non-medical: Not on file  Tobacco Use  . Smoking status: Former Research scientist (life sciences)  . Smokeless tobacco: Never Used  Substance and Sexual Activity  . Alcohol use: Never    Frequency: Never  . Drug use: Never  . Sexual activity: Not on file  Lifestyle  . Physical activity    Days per week: Not on file    Minutes per session: Not on file  . Stress: Not on file  Relationships  . Social Herbalist on phone: Not on file    Gets together: Not on file     Attends religious service: Not on file    Active member of club or organization: Not on file    Attends meetings of clubs or organizations: Not on file    Relationship status: Not on file  . Intimate partner violence    Fear of current or ex partner: Not on file    Emotionally abused: Not on file    Physically abused: Not on file    Forced sexual activity: Not on file  Other Topics Concern  . Not on file  Social History Narrative  . Not on file     No Known Allergies   Prior to Admission medications   Medication Sig Start Date End Date Taking? Authorizing Provider  hydrocortisone (CORTEF) 20 MG tablet Take 1/2 (one-half) tablet by mouth once daily 07/04/19  Yes Stallings, Zoe A, MD  Multiple Vitamins-Minerals (CENTRUM SILVER 50+WOMEN) TABS Take 1 tablet by mouth daily.   Yes [provider]  prednisoLONE acetate (PRED FORTE) 1 % ophthalmic suspension Place 1 drop into both eyes 2 (two) times daily. 04/18/18  Yes Stallings, Zoe A, MD  sodium chloride (MURO 128) 2 % ophthalmic solution Place 1 drop into both eyes 2 (two) times daily. 04/18/18  Yes Stallings, Zoe A, MD  escitalopram (LEXAPRO) 10 MG tablet Take 1 tablet (10 mg total) by mouth daily. Patient not taking: Reported on 07/16/2019 07/04/19   Forrest Moron, MD  hydrochlorothiazide (HYDRODIURIL) 25 MG tablet Take 1 tablet (25 mg total) by mouth daily. Patient not taking: Reported on 07/16/2019 07/04/19   Forrest Moron, MD  polyethylene glycol (MIRALAX / GLYCOLAX) packet Take 17 g by mouth 2 (two) times daily. (until having soft bowel movement daily, then use as needed or daily) Patient not taking: Reported on 07/16/2019 02/06/18   Elodia Florence., MD  Propylene Glycol (SYSTANE BALANCE OP) Apply 2 drops to eye 3 (three) times daily as needed (dry eyes).     [provider]     Depression screen [facility] 2/9 07/16/2019 07/03/2019 04/02/2019 09/21/2018 05/11/2018  Decreased Interest 0 0 0 0 0  Down, Depressed,  Hopeless 0 0 0 0 0  PHQ - 2 Score 0 0 0 0 0     Fall Risk  07/16/2019 07/03/2019 04/02/2019 09/21/2018 05/11/2018  Falls in the past year? 0 0 0 0 Yes  Number falls in past yr: 0 0 0 - 1  Injury with Fall? 0 0 0 - Yes  Comment - - - - fractured left leg  Risk Factor Category  - - - - -  Follow up Falls evaluation completed;Education provided;Falls prevention discussed Falls evaluation completed Falls evaluation completed - -      PHYSICAL EXAM: BP (!) 142/68 Comment: taken from previous visit  Ht 5' 3.5" (1.613 m)   Wt 130 lb (59 kg) Comment: taken from previous visit  BMI 22.67 kg/m    Wt Readings from Last 3 Encounters:  07/16/19 130 lb (59 kg)  07/03/19 130 lb 9.6 oz (59.2 kg)  04/02/19 129 lb (58.5 kg)     Medicare annual wellness visit, subsequent   Education/Counseling provided regarding diet and exercise, prevention of chronic diseases, smoking/tobacco cessation, if applicable, and reviewed "Covered Medicare Preventive Services."

## 2019-07-16 NOTE — Patient Instructions (Signed)
Thank you for taking time to come for your Medicare Wellness Visit. I appreciate your ongoing commitment to your health goals. Please review the following plan we discussed and let me know if I can assist you in the future.    LPN  Preventive Care 74 Years and Older, Female Preventive care refers to lifestyle choices and visits with your health care provider that can promote health and wellness. This includes:  A yearly physical exam. This is also called an annual well check.  Regular dental and eye exams.  Immunizations.  Screening for certain conditions.  Healthy lifestyle choices, such as diet and exercise. What can I expect for my preventive care visit? Physical exam Your health care provider will check:  Height and weight. These may be used to calculate body mass index (BMI), which is a measurement that tells if you are at a healthy weight.  Heart rate and blood pressure.  Your skin for abnormal spots. Counseling Your health care provider may ask you questions about:  Alcohol, tobacco, and drug use.  Emotional well-being.  Home and relationship well-being.  Sexual activity.  Eating habits.  History of falls.  Memory and ability to understand (cognition).  Work and work environment.  Pregnancy and menstrual history. What immunizations do I need?  Influenza (flu) vaccine  This is recommended every year. Tetanus, diphtheria, and pertussis (Tdap) vaccine  You may need a Td booster every 10 years. Varicella (chickenpox) vaccine  You may need this vaccine if you have not already been vaccinated. Zoster (shingles) vaccine  You may need this after age 60. Pneumococcal conjugate (PCV13) vaccine  One dose is recommended after age 65. Pneumococcal polysaccharide (PPSV23) vaccine  One dose is recommended after age 65. Measles, mumps, and rubella (MMR) vaccine  You may need at least one dose of MMR if you were born in 1957 or later. You may also  need a second dose. Meningococcal conjugate (MenACWY) vaccine  You may need this if you have certain conditions. Hepatitis A vaccine  You may need this if you have certain conditions or if you travel or work in places where you may be exposed to hepatitis A. Hepatitis B vaccine  You may need this if you have certain conditions or if you travel or work in places where you may be exposed to hepatitis B. Haemophilus influenzae type b (Hib) vaccine  You may need this if you have certain conditions. You may receive vaccines as individual doses or as more than one vaccine together in one shot (combination vaccines). Talk with your health care provider about the risks and benefits of combination vaccines. What tests do I need? Blood tests  Lipid and cholesterol levels. These may be checked every 5 years, or more frequently depending on your overall health.  Hepatitis C test.  Hepatitis B test. Screening  Lung cancer screening. You may have this screening every year starting at age 55 if you have a 30-pack-year history of smoking and currently smoke or have quit within the past 15 years.  Colorectal cancer screening. All adults should have this screening starting at age 50 and continuing until age 75. Your health care provider may recommend screening at age 45 if you are at increased risk. You will have tests every 1-10 years, depending on your results and the type of screening test.  Diabetes screening. This is done by checking your blood sugar (glucose) after you have not eaten for a while (fasting). You may have this done every 1-3   years.  Mammogram. This may be done every 1-2 years. Talk with your health care provider about how often you should have regular mammograms.  BRCA-related cancer screening. This may be done if you have a family history of breast, ovarian, tubal, or peritoneal cancers. Other tests  Sexually transmitted disease (STD) testing.  Bone density scan. This is done  to screen for osteoporosis. You may have this done starting at age 6. Follow these instructions at home: Eating and drinking  Eat a diet that includes fresh fruits and vegetables, whole grains, lean protein, and low-fat dairy products. Limit your intake of foods with high amounts of sugar, saturated fats, and salt.  Take vitamin and mineral supplements as recommended by your health care provider.  Do not drink alcohol if your health care provider tells you not to drink.  If you drink alcohol: ? Limit how much you have to 0-1 drink a day. ? Be aware of how much alcohol is in your drink. In the [place] one drink equals one 12 oz bottle of beer (355 mL), one 5 oz glass of wine (148 mL), or one 1 oz glass of hard liquor (44 mL). Lifestyle  Take daily care of your teeth and gums.  Stay active. Exercise for at least 30 minutes on 5 or more days each week.  Do not use any products that contain nicotine or tobacco, such as cigarettes, e-cigarettes, and chewing tobacco. If you need help quitting, ask your health care provider.  If you are sexually active, practice safe sex. Use a condom or other form of protection in order to prevent STIs (sexually transmitted infections).  Talk with your health care provider about taking a low-dose aspirin or statin. What's next?  Go to your health care provider once a year for a well check visit.  Ask your health care provider how often you should have your eyes and teeth checked.  Stay up to date on all vaccines. This information is not intended to replace advice given to you by your health care provider. Make sure you discuss any questions you have with your health care provider. Document Released: 10/24/2015 Document Revised: 09/21/2018 Document Reviewed: 09/21/2018 Elsevier Patient Education  2020 [employer].

## 2019-07-25 ENCOUNTER — Ambulatory Visit: Payer: Medicare Other | Admitting: Family Medicine

## 2019-07-29 ENCOUNTER — Other Ambulatory Visit: Payer: Self-pay | Admitting: Family Medicine

## 2019-07-30 NOTE — Telephone Encounter (Signed)
Requested medication (s) are due for refill today: no  Requested medication (s) are on the active medication list: yes  Last refill:  04/13/2019  Future visit scheduled: yes  Notes to clinic:  Review for refill   Requested Prescriptions  Pending Prescriptions Disp Refills   prednisoLONE acetate (PRED FORTE) 1 % ophthalmic suspension [Pharmacy Med Name: prednisoLONE Acetate 1 % Ophthalmic Suspension] 5 mL 0    Sig: INSTILL 1 DROP INTO EACH EYE TWICE DAILY     Off-Protocol Failed - 07/29/2019  1:06 PM      Failed - Medication not assigned to a protocol, review manually.      Passed - Valid encounter within last 12 months    Recent Outpatient Visits          2 weeks ago Medicare annual wellness visit, subsequent   Primary Care at [facility], [place] A, MD   3 weeks ago Type 2 diabetes mellitus with other specified complication, without long-term current use of insulin [facility])   Primary Care at [facility], [place] A, MD   3 months ago White coat syndrome with hypertension   Primary Care at [place], [place]   4 months ago Hypertensive emergency   Primary Care at [facility] Arlie Solomons, MD   7 months ago Adrenal insufficiency [facility])   Primary Care at [facility], Lilia Argue, MD      Future Appointments            In 5 months Forrest Moron, MD Primary Care at [place], [facility]

## 2019-08-28 DIAGNOSIS — H2512 Age-related nuclear cataract, left eye: Secondary | ICD-10-CM | POA: Diagnosis not present

## 2019-09-04 DIAGNOSIS — H18421 Band keratopathy, right eye: Secondary | ICD-10-CM | POA: Diagnosis not present

## 2019-09-04 DIAGNOSIS — Z961 Presence of intraocular lens: Secondary | ICD-10-CM | POA: Diagnosis not present

## 2019-09-04 DIAGNOSIS — H2589 Other age-related cataract: Secondary | ICD-10-CM | POA: Diagnosis not present

## 2019-09-04 DIAGNOSIS — H2142 Pupillary membranes, left eye: Secondary | ICD-10-CM | POA: Diagnosis not present

## 2019-10-02 ENCOUNTER — Other Ambulatory Visit: Payer: Self-pay

## 2019-10-02 ENCOUNTER — Ambulatory Visit (INDEPENDENT_AMBULATORY_CARE_PROVIDER_SITE_OTHER): Payer: Medicare Other | Admitting: Family Medicine

## 2019-10-02 ENCOUNTER — Encounter: Payer: Self-pay | Admitting: Family Medicine

## 2019-10-02 VITALS — BP 152/74 | HR 63 | Temp 97.4°F | Resp 16 | Ht 63.5 in | Wt 133.8 lb

## 2019-10-02 DIAGNOSIS — H9193 Unspecified hearing loss, bilateral: Secondary | ICD-10-CM | POA: Diagnosis not present

## 2019-10-02 DIAGNOSIS — E0580 Other thyrotoxicosis without thyrotoxic crisis or storm: Secondary | ICD-10-CM

## 2019-10-02 DIAGNOSIS — Z23 Encounter for immunization: Secondary | ICD-10-CM | POA: Diagnosis not present

## 2019-10-02 DIAGNOSIS — E1169 Type 2 diabetes mellitus with other specified complication: Secondary | ICD-10-CM | POA: Diagnosis not present

## 2019-10-02 NOTE — Progress Notes (Signed)
Established Patient Office Visit  Subjective:  Patient ID: [name], female    DOB: [birth year]  Age: 73 y.o. MRN: [id number]  CC:  Chief Complaint  Patient presents with  . Referral    cochlear implant    HPI [name] presents for   She will be getting an evaluation in January to remove some of the calcification on her eyes Her son would also like a referral for ENT for a cochlear implant His mother has been coping well with the deafness that started in adulthood but now that everyone is in mask and she cannot see very well her quality of life is so much worse.  She cannot lip read because of the masks.  She gets anxiety as a result.   Iatrogenic hyperthyroidism Patient thyroid medication was discontinued due to hyperthyroidism Her son reports that she has been getting episodes where she feels very hot and gets panicked She starts to almost tear off her clothes He is wondering why this is happening Lab Results  Component Value Date   TSH 0.007 (L) 07/03/2019   Diabetes Mellitus: Patient presents for follow up of diabetes. Symptoms: blindness but no hypoglycemia. Symptoms have stabilized.  She eats a healthy diet.  Her son reports that her diabetes is well controlled.  Lab Results  Component Value Date   HGBA1C 5.8 (H) 12/06/2018      Past Medical History:  Diagnosis Date  . Adrenal insufficiency (Addison's disease) ([place])   . Hypertension   . Thyroid disease    hypothyroidism  . Type 2 diabetes mellitus ([place])     Past Surgical History:  Procedure Laterality Date  . EYE SURGERY    . FEMUR IM NAIL Left 02/02/2018  . FEMUR IM NAIL Left 02/02/2018   Procedure: INTRAMEDULLARY (IM) NAIL LEFT FEMUR;  Surgeon: Shona Needles, MD;  Location: [facility];  Service: Orthopedics;  Laterality: Left;    Family History  Problem Relation Age of Onset  . Vitiligo Son     Social History   Socioeconomic History  . Marital status: Divorced    Spouse name: Not on  file  . Number of children: Not on file  . Years of education: Not on file  . Highest education level: Not on file  Occupational History  . Not on file  Tobacco Use  . Smoking status: Former Research scientist (life sciences)  . Smokeless tobacco: Never Used  Substance and Sexual Activity  . Alcohol use: Never  . Drug use: Never  . Sexual activity: Not on file  Other Topics Concern  . Not on file  Social History Narrative  . Not on file   Social Determinants of Health   Financial Resource Strain:   . Difficulty of Paying Living Expenses: Not on file  Food Insecurity:   . Worried About Charity fundraiser in the Last Year: Not on file  . Ran Out of Food in the Last Year: Not on file  Transportation Needs:   . Lack of Transportation (Medical): Not on file  . Lack of Transportation (Non-Medical): Not on file  Physical Activity:   . Days of Exercise per Week: Not on file  . Minutes of Exercise per Session: Not on file  Stress:   . Feeling of Stress : Not on file  Social Connections:   . Frequency of Communication with Friends and Family: Not on file  . Frequency of Social Gatherings with Friends and Family: Not on file  . Attends Religious Services:  Not on file  . Active Member of Clubs or Organizations: Not on file  . Attends Banker Meetings: Not on file  . Marital Status: Not on file  Intimate Partner Violence:   . Fear of Current or Ex-Partner: Not on file  . Emotionally Abused: Not on file  . Physically Abused: Not on file  . Sexually Abused: Not on file    Outpatient Medications Prior to Visit  Medication Sig Dispense Refill  . hydrochlorothiazide (HYDRODIURIL) 25 MG tablet Take 1 tablet (25 mg total) by mouth daily. 30 tablet 3  . hydrocortisone (CORTEF) 20 MG tablet Take 1/2 (one-half) tablet by mouth once daily 45 tablet 3  . Multiple Vitamins-Minerals (CENTRUM SILVER 50+WOMEN) TABS Take 1 tablet by mouth daily.    . polyethylene glycol (MIRALAX / GLYCOLAX) packet Take 17 g  by mouth 2 (two) times daily. (until having soft bowel movement daily, then use as needed or daily) 14 each 0  . prednisoLONE acetate (PRED FORTE) 1 % ophthalmic suspension INSTILL 1 DROP INTO EACH EYE TWICE DAILY 5 mL 6  . Propylene Glycol (SYSTANE BALANCE OP) Apply 2 drops to eye 3 (three) times daily as needed (dry eyes).     . sodium chloride (MURO 128) 2 % ophthalmic solution Place 1 drop into both eyes 2 (two) times daily. 15 mL 0  . escitalopram (LEXAPRO) 10 MG tablet Take 1 tablet (10 mg total) by mouth daily. (Patient not taking: Reported on 07/16/2019) 30 tablet 3   No facility-administered medications prior to visit.    No Known Allergies  ROS Review of Systems Review of Systems  Constitutional: Negative for activity change, appetite change, chills and fever.  HENT: Negative for congestion, nosebleeds, trouble swallowing and voice change.   Respiratory: Negative for cough, shortness of breath and wheezing.   Gastrointestinal: Negative for diarrhea, nausea and vomiting.  Genitourinary: Negative for difficulty urinating, dysuria, flank pain and hematuria.  Musculoskeletal: Negative for back pain, joint swelling and neck pain.  Neurological: Negative for dizziness, speech difficulty, light-headedness and numbness.  See HPI. All other review of systems negative.     Objective:    Physical Exam  BP (!) 152/74 (BP Location: Left Arm, Patient Position: Sitting, Cuff Size: Normal)   Pulse 63   Temp (!) 97.4 F (36.3 C) (Oral)   Resp 16   Ht 5' 3.5" (1.613 m)   Wt 133 lb 12.8 oz (60.7 kg)   SpO2 100%   BMI 23.33 kg/m  Wt Readings from Last 3 Encounters:  10/02/19 133 lb 12.8 oz (60.7 kg)  07/16/19 130 lb (59 kg)  07/03/19 130 lb 9.6 oz (59.2 kg)   Physical Exam  Constitutional: Oriented to person, place, and time. Appears well-developed and well-nourished.  HENT:  Head: Normocephalic and atraumatic.  Eyes: white cast over pupil Cardiovascular: Normal rate, regular  rhythm, normal heart sounds and intact distal pulses.  No murmur heard. Pulmonary/Chest: Effort normal and breath sounds normal. No stridor. No respiratory distress. Has no wheezes.  Neurological: Is alert and oriented to person, place, and time.  Skin: Skin is warm. Capillary refill takes less than 2 seconds.  Psychiatric: Has a normal mood and affect. Behavior is normal. Judgment and thought content normal.    Health Maintenance Due  Topic Date Due  . OPHTHALMOLOGY EXAM  12/31/1954  . TETANUS/TDAP  12/31/1963  . COLONOSCOPY  12/31/1994  . DEXA SCAN  12/30/2009  . PNA vac Low Risk Adult (1 of 2 -  PCV13) 12/30/2009  . MAMMOGRAM  08/26/2011  . INFLUENZA VACCINE  05/12/2019  . HEMOGLOBIN A1C  06/06/2019    There are no preventive care reminders to display for this patient.  Lab Results  Component Value Date   TSH 0.007 (L) 07/03/2019   Lab Results  Component Value Date   WBC 9.1 03/19/2019   HGB 13.7 03/19/2019   HCT 41.1 03/19/2019   MCV 80.7 03/19/2019   PLT 240 03/19/2019   Lab Results  Component Value Date   NA 141 03/19/2019   K 3.3 (L) 03/19/2019   CO2 18 (L) 03/19/2019   GLUCOSE 83 03/19/2019   BUN 14 03/19/2019   CREATININE 1.14 (H) 03/19/2019   BILITOT 0.7 03/19/2019   ALKPHOS 62 03/19/2019   AST 28 03/19/2019   ALT 12 03/19/2019   PROT 6.7 03/19/2019   ALBUMIN 4.0 03/19/2019   CALCIUM 9.6 03/19/2019   ANIONGAP 16 (H) 03/19/2019   Lab Results  Component Value Date   CHOL 189 07/03/2019   Lab Results  Component Value Date   HDL 46 07/03/2019   Lab Results  Component Value Date   LDLCALC 120 (H) 07/03/2019   Lab Results  Component Value Date   TRIG 131 07/03/2019   Lab Results  Component Value Date   CHOLHDL 4.1 07/03/2019   Lab Results  Component Value Date   HGBA1C 5.8 (H) 12/06/2018      Assessment & Plan:   Problem List Items Addressed This Visit      Endocrine   Type II diabetes mellitus (HCC) - Primary well  controlled hemoglobin a1c is at goal Continue exercise Lipids monitored and renal function in range On metformin On asa 81mg  Reviewed diabetic foot care Emphasized importance of eye and dental exam      Relevant Orders   Hemoglobin A1c    Other Visit Diagnoses    Need for prophylactic vaccination against Streptococcus pneumoniae (pneumococcus)    - pt declined vaccine   Bilateral deafness    -  diuscussed her referral for cochlear implant Also discussed that she should continue to use aids to improve communication but that she should see ENT for discussion about cochlear implant The family refuses all vaccines. This provider discussed that a pneumonia vaccination is mandatory. They are agreeable if she is approved for the surgery. Discussed that with her diabetes she should get this vaccine because it is the right thing to do. Declined today.   Relevant Orders   Ambulatory referral to ENT   Iatrogenic hyperthyroidism    -  The levels remain very hyperthyroid but now that she is off medications her medications have improved.   Relevant Orders   TSH+T4F+T3Free      No orders of the defined types were placed in this encounter.   Follow-up: Return in about 3 months (around 12/31/2019) for wellness visit.    Doristine BosworthZoe A Ramal Eckhardt, MD

## 2019-10-02 NOTE — Patient Instructions (Addendum)
  Vaccination Recommendations for Patients Receiving Cochlear Implants New recommendations have been issued by the Center for Disease Control: Children should receive PCV13 vaccinations.  Older children (ages 2-5 yrs.) should receive two doses of PCV13 if they have not received any doses of PCV7 or PCV13. If they have completed the four doses of PCV7 series, they should receive one dose of PCV13 through age 74 months.  Children 6-8 yrs of age may receive a single dose of PCV13 regardless of whether they have previously received PCV7 or the pneumococcal polysaccharide vaccine (PPSV) (Pneumovax)  In addition to PCV13, children should receive one dose of PPSV at age 12 or older and after completing all recommended doses of PCV13.  Adults 46 yrs of age or older should receive a single dose of PPSV.  For both children and adults, the vaccination schedule should be completed at least two weeks before cochlear implant surgery.    If you have lab work done today you will be contacted with your lab results within the next 2 weeks.  If you have not heard from [place] then please contact us. The fastest way to get your results is to register for My Chart.   IF you received an x-ray today, you will receive an invoice from [facility]. Please contact [facility] Rad[facility] at [phone] with questions or concerns regarding your invoice.   IF you received labwork today, you will receive an invoice from [facility]. Please contact [facility] at [phone] with questions or concerns regarding your invoice.   Our billing staff will not be able to assist you with questions regarding bills from these companies.  You will be contacted with the lab results as soon as they are available. The fastest way to get your results is to activate your My Chart account. Instructions are located on the last page of this paperwork. If you have not heard from [place] regarding the results in 2 weeks, please contact this office.

## 2019-10-03 LAB — TSH+T4F+T3FREE
Free T4: <0.1 ng/dL — ABNORMAL LOW (ref 0.82–1.77)
T3, Free: <0.3 pg/mL — ABNORMAL LOW (ref 2.0–4.4)
TSH: 0.234 u[IU]/mL — ABNORMAL LOW (ref 0.450–4.500)

## 2019-10-03 LAB — HEMOGLOBIN A1C
Est. average glucose Bld gHb Est-mCnc: 134 mg/dL
Hgb A1c MFr Bld: 6.3 % — ABNORMAL HIGH (ref 4.8–5.6)

## 2019-10-22 DIAGNOSIS — Z961 Presence of intraocular lens: Secondary | ICD-10-CM | POA: Diagnosis not present

## 2019-10-22 DIAGNOSIS — H268 Other specified cataract: Secondary | ICD-10-CM | POA: Diagnosis not present

## 2019-10-22 DIAGNOSIS — Z9841 Cataract extraction status, right eye: Secondary | ICD-10-CM | POA: Diagnosis not present

## 2019-10-22 DIAGNOSIS — H179 Unspecified corneal scar and opacity: Secondary | ICD-10-CM | POA: Diagnosis not present

## 2019-12-18 DIAGNOSIS — Z961 Presence of intraocular lens: Secondary | ICD-10-CM | POA: Diagnosis not present

## 2019-12-18 DIAGNOSIS — H30031 Focal chorioretinal inflammation, peripheral, right eye: Secondary | ICD-10-CM | POA: Diagnosis not present

## 2019-12-18 DIAGNOSIS — H1789 Other corneal scars and opacities: Secondary | ICD-10-CM | POA: Diagnosis not present

## 2019-12-18 DIAGNOSIS — H35371 Puckering of macula, right eye: Secondary | ICD-10-CM | POA: Diagnosis not present

## 2019-12-18 DIAGNOSIS — H18421 Band keratopathy, right eye: Secondary | ICD-10-CM | POA: Diagnosis not present

## 2019-12-23 IMAGING — DX DG CHEST 1V PORT
1 series · 1 of 1 positions shown · non-contrast
Comparison: Chest radiograph June 25, 2005

CLINICAL DATA: Fall, deformity.

EXAM:
PORTABLE CHEST 1 VIEW

[chest ap]
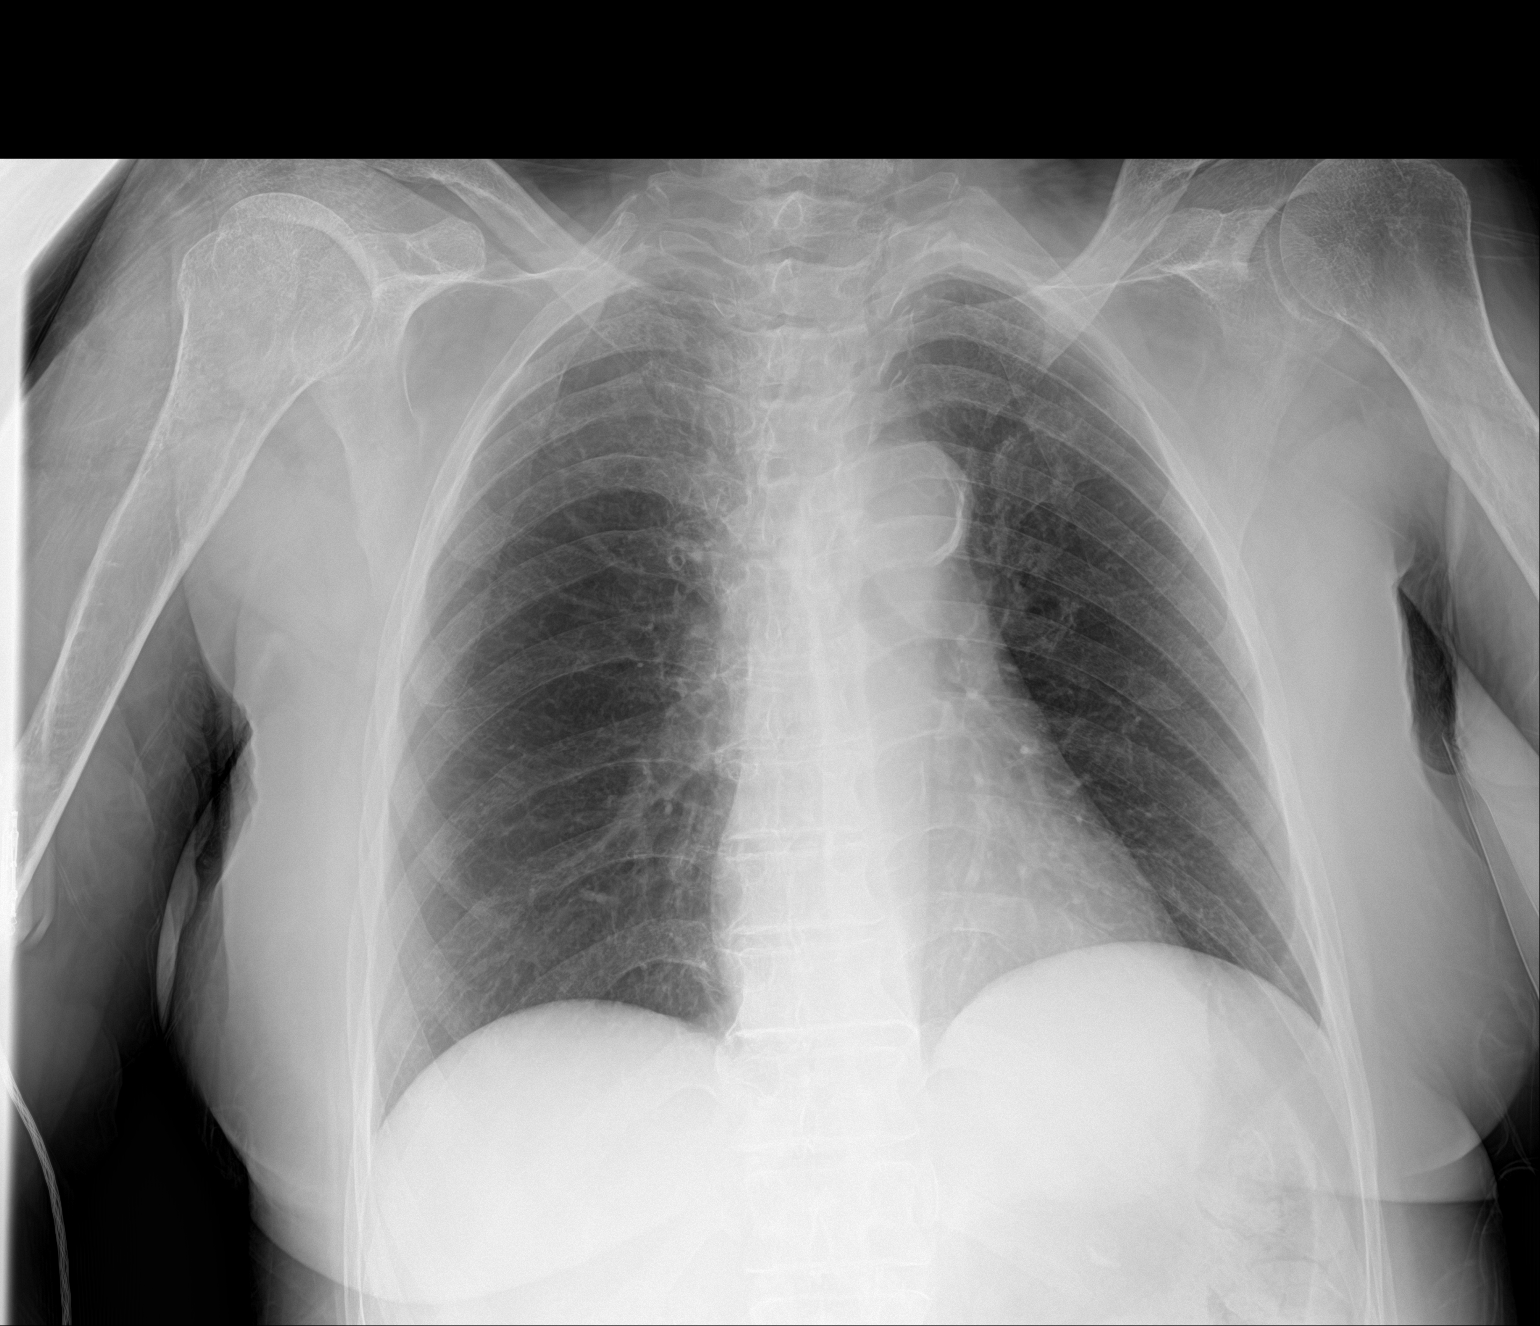

[1 of 1 positions shown; findings below may reference images not displayed]

FINDINGS: Cardiomediastinal silhouette is normal. Coronary artery
calcifications versus stents. Calcified aortic arch. No pleural
effusions or focal consolidations. Trachea projects midline and
there is no pneumothorax. Soft tissue planes and included osseous
structures are non-suspicious. Osteopenia.
IMPRESSION: No acute cardiopulmonary process.

Aortic Atherosclerosis ([id number]-LI8.8).

## 2019-12-24 IMAGING — DX DG FEMUR 2+V PORT*L*
4 series · 4 of 4 positions shown · non-contrast
Comparison: 02/01/2018

CLINICAL DATA: Postop

EXAM:
LEFT FEMUR PORTABLE 2 VIEWS

[femur ap (1 of 2)]
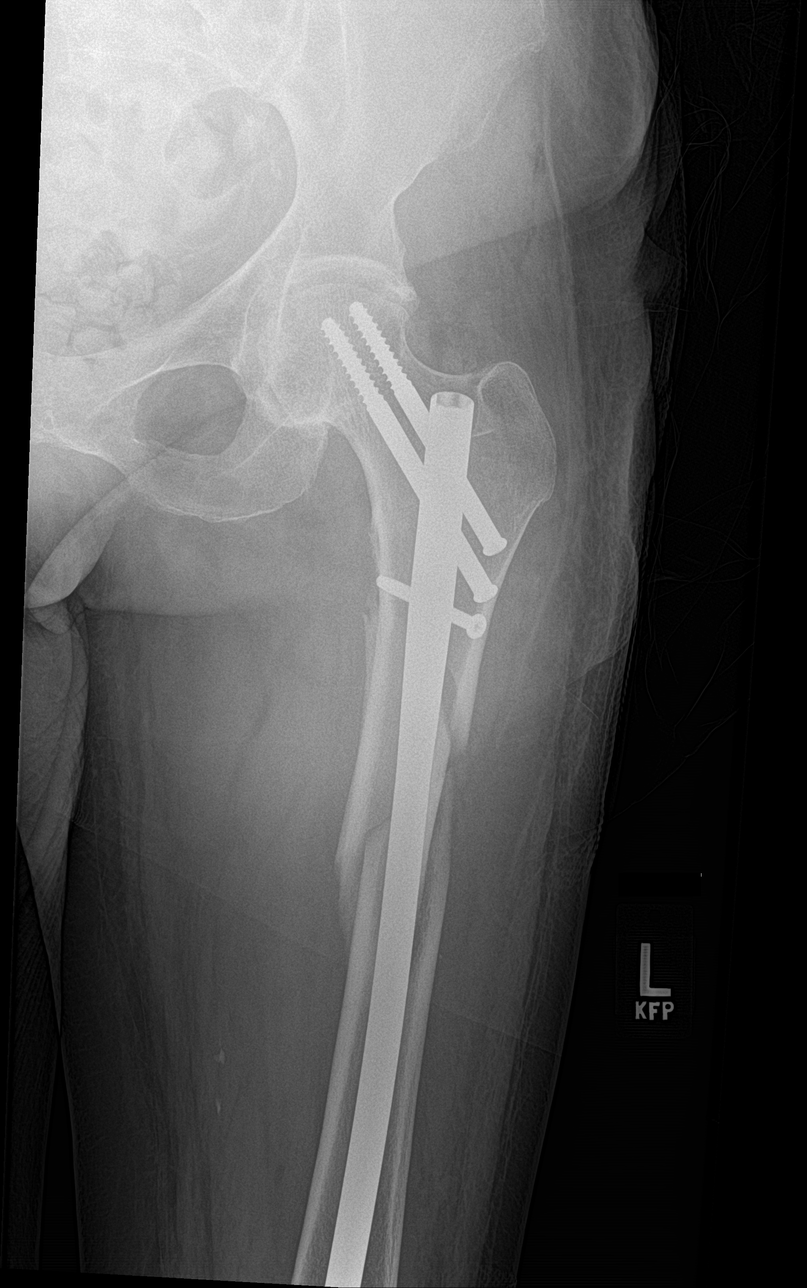

[femur lat (1 of 2)]
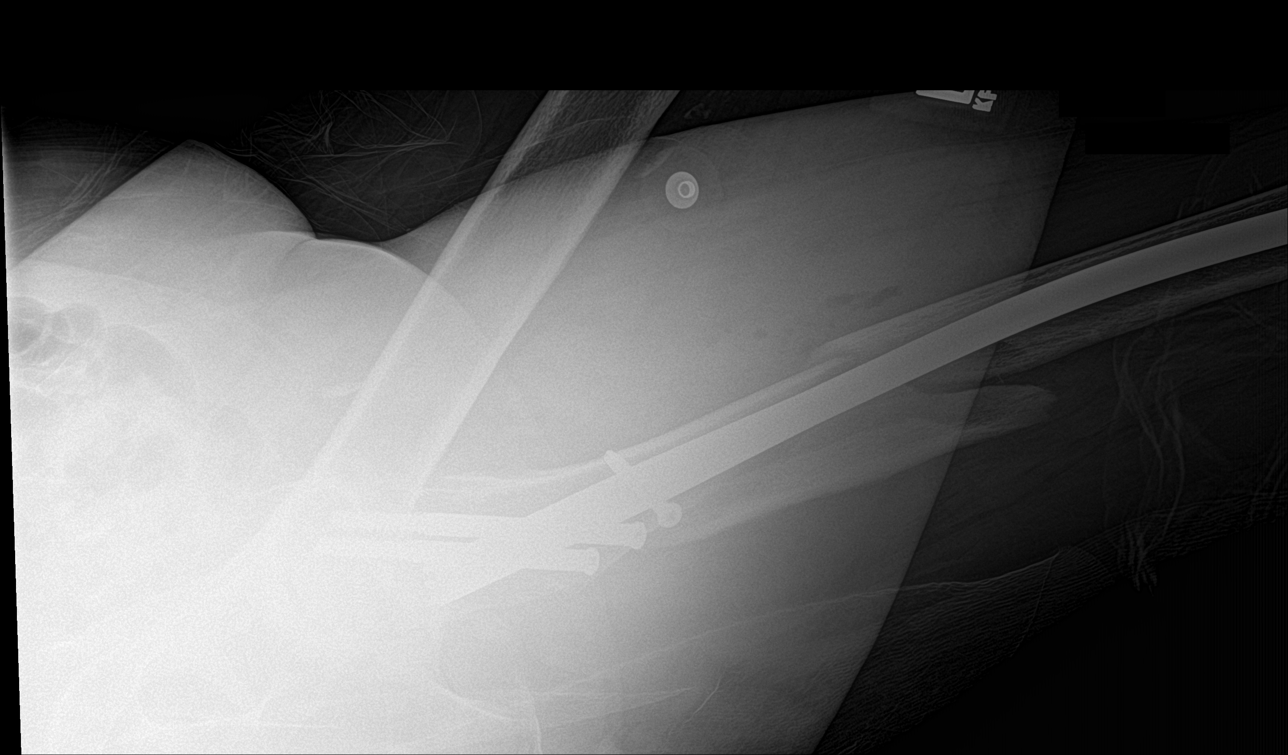

[femur lat (2 of 2)]
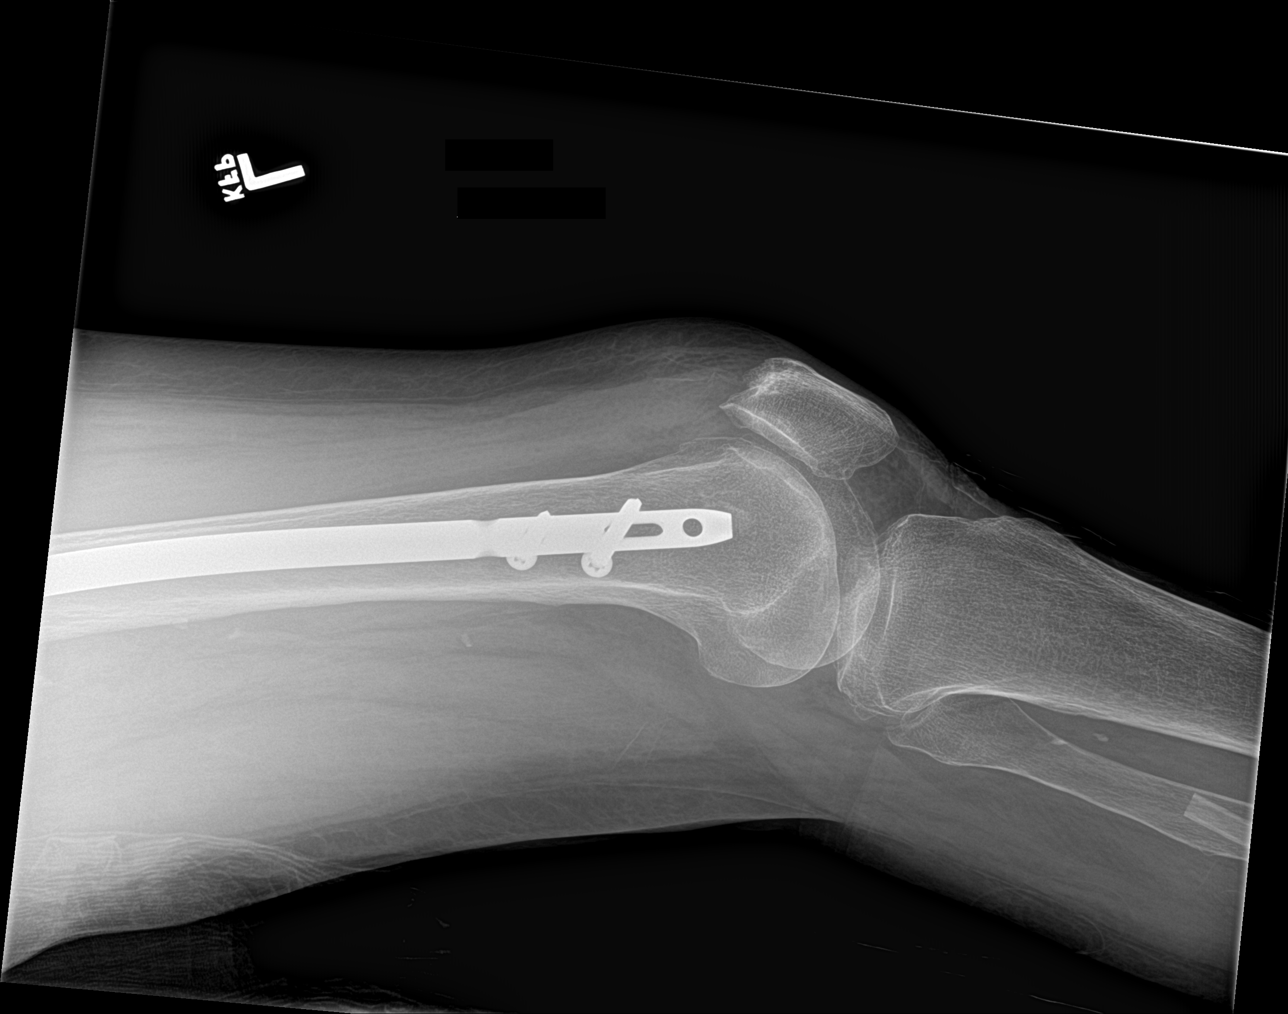

[femur ap (2 of 2)]
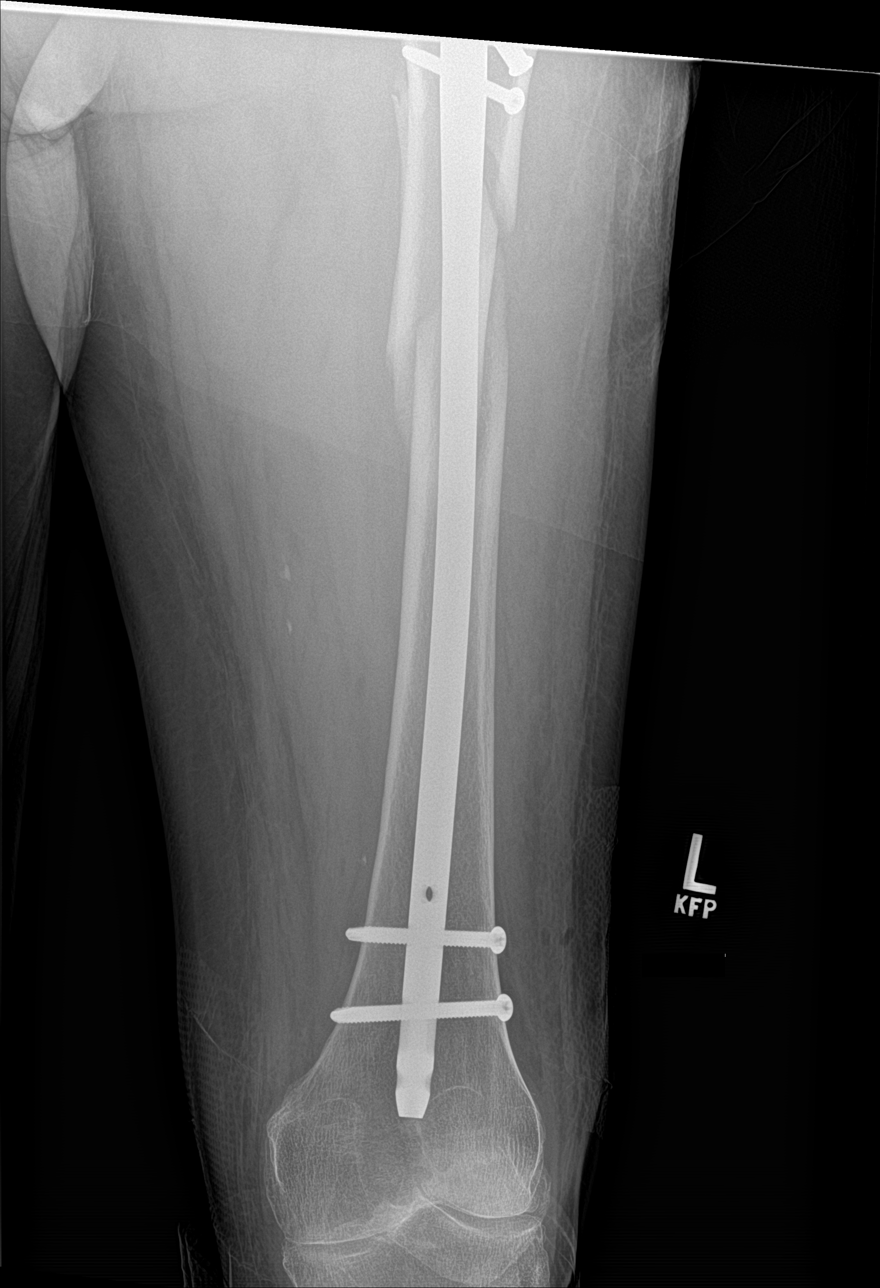

[4 of 4 positions shown; findings below may reference images not displayed]

FINDINGS: Interval intramedullary rod and screw fixation of the left femur for
comminuted fracture involving the proximal shaft of the femur.
Decreased angulation and displacement at the fracture site. Small
soft tissue gas consistent with recent operative status.
IMPRESSION: Interval intramedullary rod and screw fixation of left femur for
comminuted fracture of the proximal femoral shaft with decreased
displacement and angulation compared to prior. Expected postsurgical
changes.

## 2019-12-25 ENCOUNTER — Emergency Department (HOSPITAL_COMMUNITY)
Admission: EM | Admit: 2019-12-25 | Discharge: 2019-12-25 | Disposition: A | Discharge status: Home / Self Care | Payer: Medicare Other | Attending: Emergency Medicine | Admitting: Emergency Medicine

## 2019-12-25 ENCOUNTER — Other Ambulatory Visit: Payer: Self-pay

## 2019-12-25 ENCOUNTER — Encounter (HOSPITAL_COMMUNITY): Payer: Self-pay | Admitting: Emergency Medicine

## 2019-12-25 ENCOUNTER — Emergency Department (HOSPITAL_COMMUNITY): Payer: Medicare Other

## 2019-12-25 DIAGNOSIS — E119 Type 2 diabetes mellitus without complications: Secondary | ICD-10-CM | POA: Diagnosis not present

## 2019-12-25 DIAGNOSIS — E039 Hypothyroidism, unspecified: Secondary | ICD-10-CM | POA: Insufficient documentation

## 2019-12-25 DIAGNOSIS — I959 Hypotension, unspecified: Secondary | ICD-10-CM | POA: Diagnosis not present

## 2019-12-25 DIAGNOSIS — I517 Cardiomegaly: Secondary | ICD-10-CM | POA: Diagnosis not present

## 2019-12-25 DIAGNOSIS — Z87891 Personal history of nicotine dependence: Secondary | ICD-10-CM | POA: Diagnosis not present

## 2019-12-25 DIAGNOSIS — E271 Primary adrenocortical insufficiency: Secondary | ICD-10-CM | POA: Insufficient documentation

## 2019-12-25 DIAGNOSIS — Z79899 Other long term (current) drug therapy: Secondary | ICD-10-CM | POA: Diagnosis not present

## 2019-12-25 DIAGNOSIS — I10 Essential (primary) hypertension: Secondary | ICD-10-CM | POA: Insufficient documentation

## 2019-12-25 DIAGNOSIS — E860 Dehydration: Secondary | ICD-10-CM

## 2019-12-25 DIAGNOSIS — R55 Syncope and collapse: Secondary | ICD-10-CM | POA: Diagnosis not present

## 2019-12-25 DIAGNOSIS — R000 Tachycardia, unspecified: Secondary | ICD-10-CM | POA: Diagnosis not present

## 2019-12-25 DIAGNOSIS — R001 Bradycardia, unspecified: Secondary | ICD-10-CM | POA: Diagnosis not present

## 2019-12-25 LAB — CBC WITH DIFFERENTIAL/PLATELET
Abs Immature Granulocytes: 0.05 10*3/uL (ref 0.00–0.07)
Basophils Absolute: 0.1 10*3/uL (ref 0.0–0.1)
Basophils Relative: 1 %
Eosinophils Absolute: 0.2 10*3/uL (ref 0.0–0.5)
Eosinophils Relative: 3 %
HCT: 31.1 % — ABNORMAL LOW (ref 36.0–46.0)
Hemoglobin: 10.3 g/dL — ABNORMAL LOW (ref 12.0–15.0)
Immature Granulocytes: 1 %
Lymphocytes Relative: 35 %
Lymphs Abs: 2.6 10*3/uL (ref 0.7–4.0)
MCH: 28.3 pg (ref 26.0–34.0)
MCHC: 33.1 g/dL (ref 30.0–36.0)
MCV: 85.4 fL (ref 80.0–100.0)
Monocytes Absolute: 0.4 10*3/uL (ref 0.1–1.0)
Monocytes Relative: 6 %
Neutro Abs: 4.1 10*3/uL (ref 1.7–7.7)
Neutrophils Relative %: 54 %
Platelets: 197 10*3/uL (ref 150–400)
RBC: 3.64 MIL/uL — ABNORMAL LOW (ref 3.87–5.11)
RDW: 14.7 % (ref 11.5–15.5)
WBC: 7.5 10*3/uL (ref 4.0–10.5)
nRBC: 0 % (ref 0.0–0.2)

## 2019-12-25 LAB — COMPREHENSIVE METABOLIC PANEL
ALT: 19 U/L (ref 0–44)
AST: 34 U/L (ref 15–41)
Albumin: 3.6 g/dL (ref 3.5–5.0)
Alkaline Phosphatase: 48 U/L (ref 38–126)
Anion gap: 10 (ref 5–15)
BUN: 17 mg/dL (ref 8–23)
CO2: 26 mmol/L (ref 22–32)
Calcium: 8.9 mg/dL (ref 8.9–10.3)
Chloride: 105 mmol/L (ref 98–111)
Creatinine, Ser: 1.48 mg/dL — ABNORMAL HIGH (ref 0.44–1.00)
GFR calc Af Amer: 40 mL/min — ABNORMAL LOW (ref >60)
GFR calc non Af Amer: 35 mL/min — ABNORMAL LOW (ref >60)
Glucose, Bld: 101 mg/dL — ABNORMAL HIGH (ref 70–99)
Potassium: 3.4 mmol/L — ABNORMAL LOW (ref 3.5–5.1)
Sodium: 141 mmol/L (ref 135–145)
Total Bilirubin: 0.5 mg/dL (ref 0.3–1.2)
Total Protein: 6.1 g/dL — ABNORMAL LOW (ref 6.5–8.1)

## 2019-12-25 LAB — CBG MONITORING, ED: Glucose-Capillary: 67 mg/dL — ABNORMAL LOW (ref 70–99)

## 2019-12-25 MED ORDER — SODIUM CHLORIDE 0.9 % IV SOLN: INTRAVENOUS | Status: DC

## 2019-12-25 NOTE — ED Provider Notes (Signed)
[facility] EMERGENCY DEPARTMENT Provider Note   CSN: [id number] Arrival date & time: 12/25/19  1139     History Chief Complaint  Patient presents with  . Loss of Consciousness    [name] is a 74 y.o. female.  HPI     Patient presents from home after an episode of syncope. She arrives via EMS providers who provide the history. The patient is blind and deaf, communicates with the system of padding and touching. Level 5 caveat secondary to noncommunicating, via traditional means status. Reportedly the patient has no history of syncope, today had an episode that was witnessed by family members.  No reported fall, trauma, and the patient was seemingly back to baseline fairly quickly afterwards.  Patient was hemodynamically unremarkable in transport, and now arrives sitting upright, but incapable of answering any questions related to her current status.  Past Medical History:  Diagnosis Date  . Adrenal insufficiency (Addison's disease) (HCC)   . Hypertension   . Thyroid disease    hypothyroidism  . Type 2 diabetes mellitus [facility])     Patient Active Problem List   Diagnosis Date Noted  . Anemia 02/02/2018  . Thrombocytopenia (HCC) 02/02/2018  . Hypokalemia 02/02/2018  . Hypomagnesemia 02/02/2018  . Femur fracture, left (HCC) 02/02/2018  . Adrenal insufficiency (HCC) 02/02/2018  . Hypothyroidism 02/02/2018  . Type II diabetes mellitus (HCC) 02/02/2018  . Elevated troponin 02/02/2018    Past Surgical History:  Procedure Laterality Date  . EYE SURGERY    . FEMUR IM NAIL Left 02/02/2018  . FEMUR IM NAIL Left 02/02/2018   Procedure: INTRAMEDULLARY (IM) NAIL LEFT FEMUR;  Surgeon: Roby Lofts, MD;  Location: [facility];  Service: Orthopedics;  Laterality: Left;     OB History   No obstetric history on file.     Family History  Problem Relation Age of Onset  . Vitiligo Son     Social History   Tobacco Use  . Smoking status: Former Games developer    . Smokeless tobacco: Never Used  Substance Use Topics  . Alcohol use: Never  . Drug use: Never    Home Medications Prior to Admission medications   Medication Sig Start Date End Date Taking? Authorizing Provider  escitalopram (LEXAPRO) 10 MG tablet Take 1 tablet (10 mg total) by mouth daily. Patient not taking: Reported on 07/16/2019 07/04/19   Doristine Bosworth, MD  hydrochlorothiazide (HYDRODIURIL) 25 MG tablet Take 1 tablet (25 mg total) by mouth daily. 07/04/19   Doristine Bosworth, MD  hydrocortisone (CORTEF) 20 MG tablet Take 1/2 (one-half) tablet by mouth once daily 07/04/19   Doristine Bosworth, MD  Multiple Vitamins-Minerals (CENTRUM SILVER 50+WOMEN) TABS Take 1 tablet by mouth daily.    [provider]  polyethylene glycol (MIRALAX / GLYCOLAX) packet Take 17 g by mouth 2 (two) times daily. (until having soft bowel movement daily, then use as needed or daily) 02/06/18   Zigmund Daniel., MD  prednisoLONE acetate (PRED FORTE) 1 % ophthalmic suspension INSTILL 1 DROP INTO EACH EYE TWICE DAILY 08/03/19   Doristine Bosworth, MD  Propylene Glycol (SYSTANE BALANCE OP) Apply 2 drops to eye 3 (three) times daily as needed (dry eyes).     [provider]  sodium chloride (MURO 128) 2 % ophthalmic solution Place 1 drop into both eyes 2 (two) times daily. 04/18/18   Doristine Bosworth, MD    Allergies    Patient has no known allergies.  Review of Systems   Review of Systems  Unable to perform ROS: Patient nonverbal    Physical Exam Updated Vital Signs There were no vitals taken for this visit.  Physical Exam Vitals and nursing note reviewed.  Constitutional:      General: She is not in acute distress.    Appearance: She is well-developed.  HENT:     Head: Normocephalic and atraumatic.  Eyes:     Conjunctiva/sclera: Conjunctivae normal.     Comments: Glaucomatous changes  Cardiovascular:     Rate and Rhythm: Normal rate and regular rhythm.  Pulmonary:     Effort:  Pulmonary effort is normal. No respiratory distress.     Breath sounds: Normal breath sounds. No stridor.  Abdominal:     General: There is no distension.  Skin:    General: Skin is warm and dry.  Neurological:     Mental Status: She is alert.     Comments: Patient moves all extremities spontaneously, does offer some verbal responses, though not consistently or appropriately to statements.  There is diffuse atrophy, though no focal weakness grossly appreciated.  Psychiatric:     Comments: Difficult to fully assess secondary to the patient's communication challenges.     ED Results / Procedures / Treatments   Labs (all labs ordered are listed, but only abnormal results are displayed) Labs Reviewed  COMPREHENSIVE METABOLIC PANEL - Abnormal; Notable for the following components:      Result Value   Potassium 3.4 (*)    Glucose, Bld 101 (*)    Creatinine, Ser 1.48 (*)    Total Protein 6.1 (*)    GFR calc non Af Amer 35 (*)    GFR calc Af Amer 40 (*)    All other components within normal limits  CBC WITH DIFFERENTIAL/PLATELET - Abnormal; Notable for the following components:   RBC 3.64 (*)    Hemoglobin 10.3 (*)    HCT 31.1 (*)    All other components within normal limits  CBG MONITORING, ED - Abnormal; Notable for the following components:   Glucose-Capillary 67 (*)    All other components within normal limits    EKG EKG Interpretation  Date/Time:  Tuesday December 25 2019 11:49:05 EDT Ventricular Rate:  52 PR Interval:    QRS Duration: 108 QT Interval:  511 QTC Calculation: 476 R Axis:   31 Text Interpretation: Sinus rhythm Borderline T wave abnormalities Artifact Abnormal ECG Confirmed by Carmin Muskrat [id number]) on 12/25/2019 11:51:12 AM Also confirmed by Carmin Muskrat ([id number]), editor Hattie Perch [phone])  on 12/25/2019 1:40:51 PM   Radiology DG Chest Port 1 View  Result Date: 12/25/2019 CLINICAL DATA:  Loss of consciousness EXAM: PORTABLE CHEST 1 VIEW  COMPARISON:  03/18/2020 FINDINGS: Mild cardiomegaly. Both lungs are clear. Unchanged mild elevation of the left hemidiaphragm. The visualized skeletal structures are unremarkable. IMPRESSION: Mild cardiomegaly. No acute abnormality of the lungs in AP portable projection. Electronically Signed   By: Eddie Candle M.D.   On: 12/25/2019 12:56    Procedures Procedures (including critical care time)  Medications Ordered in ED Medications  0.9 %  sodium chloride infusion ( Intravenous Stopped 12/25/19 1549)    ED Course  I have reviewed the triage vital signs and the nursing notes.  Pertinent labs & imaging results that were available during my care of the patient were reviewed by me and considered in my medical decision making (see chart for details). 12:16 PM Patient accompanied by her son.  He  affirms that the patient does have some speech capacity, though is essentially blind, with minimal capacity to communicate via reading.  Their communication system is largely padding on the shoulder, writing with large letters. With his sister via speaker phone with her about today's event.  It seems that the patient was in her usual health, at the breakfast table when she slumped over, was unresponsive, though with preserved respiration for several moments.  Reported the patient awakened with stimuli by the daughter prior to EMS arrival. Update: Patient in no distress.  Reviewed all findings with the patient's son, and another son via telephone.  Patient found to have slight elevation in creatinine, BUN, concerning for dehydration.  Findings otherwise reassuring, nonischemic EKG, no evidence for arrhythmia,  Update patient has received fluid resuscitation, family amenable to close outpatient follow-up with her physician for consideration of appropriate medication changes, possible Holter monitoring.  Absent ongoing complaints, though she did have an episode of syncope that requires additional evaluation, no  negation for admission, patient is amenable to close outpatient follow-up and this is reasonable given the reassuring findings.  Final Clinical Impression(s) / ED Diagnoses Final diagnoses:  Syncope and collapse  Dehydration     Gerhard Munch, MD 12/25/19 1558

## 2019-12-25 NOTE — Discharge Instructions (Addendum)
As discussed, today's evaluation has been generally reassuring, but with today's episode of losing consciousness that is very important that you follow-up with your physician within a week.  Please stay well-hydrated and monitor your condition carefully.  If you develop new, or concerning changes do not hesitate to return here.  When you see your physician, please discuss today's episode and consideration of additional studies that may be required, including Holter monitoring.

## 2019-12-25 NOTE — ED Triage Notes (Addendum)
Per [facility] pt from home. Pt had witnessed syncopal episode while lying in bed. No fall. Pt reports feeling weak. Pt cannot hear or see. Communicates with daughter by pats and rubs, daughter says she can be here by 1300.

## 2019-12-26 DIAGNOSIS — H30031 Focal chorioretinal inflammation, peripheral, right eye: Secondary | ICD-10-CM | POA: Diagnosis not present

## 2019-12-31 ENCOUNTER — Encounter: Payer: Self-pay | Admitting: Family Medicine

## 2019-12-31 ENCOUNTER — Ambulatory Visit (INDEPENDENT_AMBULATORY_CARE_PROVIDER_SITE_OTHER): Payer: Medicare Other | Admitting: Family Medicine

## 2019-12-31 ENCOUNTER — Other Ambulatory Visit: Payer: Self-pay

## 2019-12-31 VITALS — BP 176/60 | HR 57 | Temp 97.6°F | Resp 17 | Ht 63.5 in | Wt 131.0 lb

## 2019-12-31 DIAGNOSIS — Z0000 Encounter for general adult medical examination without abnormal findings: Secondary | ICD-10-CM

## 2019-12-31 DIAGNOSIS — Z23 Encounter for immunization: Secondary | ICD-10-CM | POA: Diagnosis not present

## 2019-12-31 DIAGNOSIS — Z1211 Encounter for screening for malignant neoplasm of colon: Secondary | ICD-10-CM | POA: Diagnosis not present

## 2019-12-31 DIAGNOSIS — I10 Essential (primary) hypertension: Secondary | ICD-10-CM | POA: Diagnosis not present

## 2019-12-31 DIAGNOSIS — E049 Nontoxic goiter, unspecified: Secondary | ICD-10-CM

## 2019-12-31 DIAGNOSIS — E0590 Thyrotoxicosis, unspecified without thyrotoxic crisis or storm: Secondary | ICD-10-CM | POA: Diagnosis not present

## 2019-12-31 DIAGNOSIS — E2740 Unspecified adrenocortical insufficiency: Secondary | ICD-10-CM

## 2019-12-31 DIAGNOSIS — E1169 Type 2 diabetes mellitus with other specified complication: Secondary | ICD-10-CM

## 2019-12-31 LAB — POCT GLYCOSYLATED HEMOGLOBIN (HGB A1C): Hemoglobin A1C: 6.1 % — AB (ref 4.0–5.6)

## 2019-12-31 MED ORDER — METOPROLOL SUCCINATE ER 25 MG PO TB24: 25.0000 mg | ORAL_TABLET | Freq: Every day | ORAL | 0 refills | Status: DC

## 2019-12-31 NOTE — Progress Notes (Signed)
QUICK REFERENCE INFORMATION: The ABCs of Providing the Annual Wellness Visit  CMS.gov Medicare Learning Network  [facility] Wellness Visit  Subjective:   [name] is a 74 y.o. Female who presents for an Annual Wellness Visit.   3/16 patient was seen in the ER for syncope.  It was witnessed at home when she became week and almost unresponsive.   She was discharged with diagnosis of hypotension.  Today she has not taken her home bp medications She reports that she is drinking gatorade  5 days ago she resume her thyroid medications because she felt like her thyroid gland in her neck was swollen and she could not swallow.  She also felt very hot inside her body and could not cool off.  When she attempted to get up she felt week.  She feels very hot when others are comfortable.  She takes her thyroid medication even though she was told to stop the thyroid medication.  She is eating and walking for exercise.   She has adrenal insufficiency and her son states that she is not skipping her steroid doses.  Her son was not aware that she was taking her thyroid medication for 5 days.   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 Her eye doctor tested for syphilis.  Results are pending.   Patient Active Problem List   Diagnosis Date Noted  . Anemia 02/02/2018  . Thrombocytopenia ([place]) 02/02/2018  . Hypokalemia 02/02/2018  . Hypomagnesemia 02/02/2018  . Femur fracture, left ([place]) 02/02/2018  . Adrenal insufficiency ([place]) 02/02/2018  . Hypothyroidism 02/02/2018  . Type II diabetes mellitus ([place]) 02/02/2018  . Elevated troponin 02/02/2018    Past Medical History:  Diagnosis Date  . Adrenal insufficiency (Addison's disease) ([place])   . Hypertension   . Thyroid disease    hypothyroidism  . Type 2 diabetes mellitus ([place])      Past Surgical History:  Procedure Laterality Date  .  EYE SURGERY    . FEMUR IM NAIL Left 02/02/2018  . FEMUR IM NAIL Left 02/02/2018   Procedure: INTRAMEDULLARY (IM) NAIL LEFT FEMUR;  Surgeon: Shona Needles, MD;  Location: [facility];  Service: Orthopedics;  Laterality: Left;     Outpatient Medications Prior to Visit  Medication Sig Dispense Refill  . escitalopram (LEXAPRO) 10 MG tablet Take 1 tablet (10 mg total) by mouth daily. 30 tablet 3  . hydrochlorothiazide (HYDRODIURIL) 25 MG tablet Take 1 tablet (25 mg total) by mouth daily. 30 tablet 3  . hydrocortisone (CORTEF) 20 MG tablet Take 1/2 (one-half) tablet by mouth once daily 45 tablet 3  . Multiple Vitamins-Minerals (CENTRUM SILVER 50+WOMEN) TABS Take 1 tablet by mouth daily.    . polyethylene glycol (MIRALAX / GLYCOLAX) packet Take 17 g by mouth 2 (two) times daily. (until having soft bowel movement daily, then use as needed or daily) 14 each 0  . prednisoLONE acetate (PRED FORTE) 1 % ophthalmic suspension INSTILL 1 DROP INTO EACH EYE TWICE DAILY 5 mL 6  . Propylene Glycol (SYSTANE BALANCE OP) Apply 2 drops to eye 3 (three) times daily as needed (dry eyes).     . sodium chloride (MURO 128) 2 % ophthalmic solution Place 1 drop into both eyes 2 (two) times daily. (Patient not taking: Reported on 12/31/2019) 15 mL 0   No facility-administered medications prior to visit.    No Known Allergies   Family  History  Problem Relation Age of Onset  . Vitiligo Son      Social History   Socioeconomic History  . Marital status: Divorced    Spouse name: Not on file  . Number of children: Not on file  . Years of education: Not on file  . Highest education level: Not on file  Occupational History  . Not on file  Tobacco Use  . Smoking status: Former Games developer  . Smokeless tobacco: Never Used  Substance and Sexual Activity  . Alcohol use: Never  . Drug use: Never  . Sexual activity: Not on file  Other Topics Concern  . Not on file  Social History Narrative  . Not on file   Social  Determinants of Health   Financial Resource Strain:   . Difficulty of Paying Living Expenses:   Food Insecurity:   . Worried About Programme researcher, broadcasting/film/video in the Last Year:   . Barista in the Last Year:   Transportation Needs:   . Freight forwarder (Medical):   Marland Kitchen Lack of Transportation (Non-Medical):   Physical Activity:   . Days of Exercise per Week:   . Minutes of Exercise per Session:   Stress:   . Feeling of Stress :   Social Connections:   . Frequency of Communication with Friends and Family:   . Frequency of Social Gatherings with Friends and Family:   . Attends Religious Services:   . Active Member of Clubs or Organizations:   . Attends Banker Meetings:   Marland Kitchen Marital Status:       Recent Hospitalizations? ER VISIT ON 12/25/19 but no hospitalization  Health Habits: Current exercise activities include: walking Exercise: 3 times/week. Diet: in general, a "healthy" diet    Alcohol intake: none  Health Risk Assessment: The patient has completed a Health Risk Assessment. This has been reveiwed with them and has been scanned into the [facility] system as an attached document.  Current Medical Providers and Suppliers: [facility] Patient Care Team: Doristine Bosworth, MD as PCP - General (Internal Medicine) Future Appointments  Date Time Provider Department Center  01/28/2020  9:00 AM Doristine Bosworth, MD PCP-PCP [facility]     Age-appropriate Screening Schedule: The list below includes current immunization status and future screening recommendations based on patient's age. Orders for these recommended tests are listed in the plan section. The patient has been provided with a written plan.  There is no immunization history on file for this patient.  Health Maintenance reviewed -  Declined vaccinations, discussed pneumovax prior to any cochlear implants Discussed cologuard which her son agrees to.   Depression Screen-PHQ2/9 completed today  Depression screen [facility]  2/9 12/31/2019 10/02/2019 07/16/2019 07/03/2019 04/02/2019  Decreased Interest 0 0 0 0 0  Down, Depressed, Hopeless 0 0 0 0 0  PHQ - 2 Score 0 0 0 0 0       Depression Severity and Treatment Recommendations:  0-4= None  5-9= Mild / Treatment: Support, educate to call if worse; return in one month  10-14= Moderate / Treatment: Support, watchful waiting; Antidepressant or Psycotherapy  15-19= Moderately severe / Treatment: Antidepressant OR Psychotherapy  >= 20 = Major depression, severe / Antidepressant AND Psychotherapy  Functional Status Survey:   Is the patient deaf or have difficulty hearing?: Yes Does the patient have difficulty seeing, even when wearing glasses/contacts?: Yes Does the patient have difficulty concentrating, remembering, or making decisions?: No Does the patient have difficulty walking  or climbing stairs?: Yes Does the patient have difficulty dressing or bathing?: Yes Does the patient have difficulty doing errands alone such as visiting a doctor's office or shopping?: Yes  Identification of Risk Factors: Risk factors include: hypertension and thyroid disease  ROS  Review of Systems  Constitutional: Negative for activity change, appetite change, chills and fever.  HENT: Negative for congestion, nosebleeds, trouble swallowing and voice change.   Respiratory: Negative for cough, shortness of breath and wheezing.   Gastrointestinal: Negative for diarrhea, nausea and vomiting.  Genitourinary: Negative for difficulty urinating, dysuria, flank pain and hematuria.  Musculoskeletal: Negative for back pain, joint swelling and neck pain.  Neurological: see hpi See HPI. All other review of systems negative.    Objective:   Vitals:   12/31/19 0811 12/31/19 0815  BP: (!) 205/74 (!) 176/60  Pulse: (!) 57   Resp: 17   Temp: 97.6 F (36.4 C)   TempSrc: Temporal   SpO2: 100%   Weight: 131 lb (59.4 kg)   Height: 5' 3.5" (1.613 m)     Body mass index is 22.84  kg/m.  Physical Exam  Constitutional: in no distress. Deaf.  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  Head: Normocephalic and atraumatic.  Eyes: Conjunctivae and EOM are normal.  Neck: rubbery, enlarged thyroid gland Cardiovascular: Normal rate, regular rhythm, normal heart sounds and intact distal pulses.  No murmur heard. Pulmonary/Chest: Effort normal and breath sounds normal. No stridor. No respiratory distress. Has no wheezes.  Neurological: Is alert and oriented to person, place, and time.  Skin: Skin is warm. Capillary refill takes less than 2 seconds.  Psychiatric: Has a normal mood and affect. Behavior is normal. Judgment and thought content normal.     Assessment/Plan:   Patient Self-Management and Personalized Health Advice The patient has been provided with information about:  return for routine annual checkups  During the course of the visit the patient was educated and counseled about appropriate screening and preventive services including:   lab testing as noted in orders section     Body mass index is 22.84 kg/m. Discussed the patient's BMI with her. The BMI BMI is in the acceptable range  [name] was seen today for annual exam.  Diagnoses and all orders for this visit:  Medicare annual wellness visit, subsequent-  Discussed wellness initiatives Declined vaccines and mammogram  Uncontrolled hypertension- labile blood pressures ?adrenal insufficiency -     Discontinue: metoprolol succinate (TOPROL-XL) 25 MG 24 hr tablet; Take 1 tablet (25 mg total) by mouth daily.  Type 2 diabetes mellitus with other specified complication, without long-term current use of insulin (HCC)- stable currently -     POCT glycosylated hemoglobin (Hb A1C)  Screening for colon cancer- discussed options, family is hesitant to do additional work up but after cologuard discussion were agreeable -     Cologuard   Need for vaccinations- discussed tdap,  pneumovax and covid vaccines Declined all but will get pneumovax if pt will be able to get a cochlear implant  Hyperthyroidism- could benefit from symptomatic relief but is bradycardic although she is currently hyperthyroid Concerning that her labile bp are due to her thyroid and her adrenal insufficiency Would like Endocrinology imput asap  -     TSH+T4F+T3Free -     Lipid panel -     US THYROID; Future -     Cancel: Ambulatory referral to Endocrinology -     Discontinue: metoprolol succinate (TOPROL-XL) 25  MG 24 hr tablet; Take 1 tablet (25 mg total) by mouth daily. -     Ambulatory referral to Endocrinology  Adrenal insufficiency [facility])- concerning that she has visual impairment with access to her meds Wondering if she took extra doses of steroids Performed teaching today Needs to get some assessment of her steroid requirement -     POCT glycosylated hemoglobin (Hb A1C) -     Lipid panel -     Cancel: Ambulatory referral to Endocrinology -     Ambulatory referral to Endocrinology  Thyroid enlargement - will get an ultrasound, pt has symptoms of hyperthyroidism The fact that she was still taking exogenous thyroid medication intermittently to treat symptoms only made things worse Concerning also for autoimmune change though based on age less likely Also will get ultrasound to look for goiter -     US THYROID; Future -     Cancel: Ambulatory referral to Endocrinology -     Ambulatory referral to Endocrinology      Return in about 4 weeks (around 01/28/2020) for review Endocrinology visit and follow up on blood pressure.  Future Appointments  Date Time Provider Department Center  01/28/2020  9:00 AM Doristine Bosworth, MD PCP-PCP [facility]    Patient Instructions    Please follow up with the Endocrinologist    If you have lab work done today you will be contacted with your lab results within the next 2 weeks.  If you have not heard from [place] then please contact us. The fastest way  to get your results is to register for My Chart.   IF you received an x-ray today, you will receive an invoice from [facility] Radiology. Please contact [facility] at [phone] with questions or concerns regarding your invoice.   IF you received labwork today, you will receive an invoice from [facility]. Please contact [facility] at [phone] with questions or concerns regarding your invoice.   Our billing staff will not be able to assist you with questions regarding bills from these companies.  You will be contacted with the lab results as soon as they are available. The fastest way to get your results is to activate your My Chart account. Instructions are located on the last page of this paperwork. If you have not heard from [place] regarding the results in 2 weeks, please contact this office.     Hyperthyroidism  Hyperthyroidism is when the thyroid gland is too active (overactive). The thyroid gland is a small gland located in the lower front part of the neck, just in front of the windpipe (trachea). This gland makes hormones that help control how the body uses food for energy (metabolism) as well as how the heart and brain function. These hormones also play a role in keeping your bones strong. When the thyroid is overactive, it produces too much of a hormone called thyroxine. What are the causes? This condition may be caused by:  Graves' disease. This is a disorder in which the body's disease-fighting system (immune system) attacks the thyroid gland. This is the most common cause.  Inflammation of the thyroid gland.  A tumor in the thyroid gland.  Use of certain medicines, including: ? Prescription thyroid hormone replacement. ? Herbal supplements that mimic thyroid hormones. ? Amiodarone therapy.  Solid or fluid-filled lumps within your thyroid gland (thyroid nodules).  Taking in a large amount of iodine from foods or medicines. What increases the risk? You are more  likely to develop this condition if:  You are female.  You have a family history of thyroid conditions.  You smoke tobacco.  You use a medicine called lithium.  You take medicines that affect the immune system (immunosuppressants). What are the signs or symptoms? Symptoms of this condition include:  Nervousness.  Inability to tolerate heat.  Unexplained weight loss.  Diarrhea.  Change in the texture of hair or skin.  Heart skipping beats or making extra beats.  Rapid heart rate.  Loss of menstruation.  Shaky hands.  Fatigue.  Restlessness.  Sleep problems.  Enlarged thyroid gland or a lump in the thyroid (nodule). You may also have symptoms of Graves' disease, which may include:  Protruding eyes.  Dry eyes.  Red or swollen eyes.  Problems with vision. How is this diagnosed? This condition may be diagnosed based on:  Your symptoms and medical history.  A physical exam.  Blood tests.  Thyroid ultrasound. This test involves using sound waves to produce images of the thyroid gland.  A thyroid scan. A radioactive substance is injected into a vein, and images show how much iodine is present in the thyroid.  Radioactive iodine uptake test (RAIU). A small amount of radioactive iodine is given by mouth to see how much iodine the thyroid absorbs after a certain amount of time. How is this treated? Treatment depends on the cause and severity of the condition. Treatment may include:  Medicines to reduce the amount of thyroid hormone your body makes.  Radioactive iodine treatment (radioiodine therapy). This involves swallowing a small dose of radioactive iodine, in capsule or liquid form, to kill thyroid cells.  Surgery to remove part or all of your thyroid gland. You may need to take thyroid hormone replacement medicine for the rest of your life after thyroid surgery.  Medicines to help manage your symptoms. Follow these instructions at home:   Take  over-the-counter and prescription medicines only as told by your health care provider.  Do not use any products that contain nicotine or tobacco, such as cigarettes and e-cigarettes. If you need help quitting, ask your health care provider.  Follow any instructions from your health care provider about diet. You may be instructed to limit foods that contain iodine.  Keep all follow-up visits as told by your health care provider. This is important. ? You will need to have blood tests regularly so that your health care provider can monitor your condition. Contact a health care provider if:  Your symptoms do not get better with treatment.  You have a fever.  You are taking thyroid hormone replacement medicine and you: ? Have symptoms of depression. ? Feel like you are tired all the time. ? Gain weight. Get help right away if:  You have chest pain.  You have decreased alertness or a change in your awareness.  You have abdominal pain.  You feel dizzy.  You have a rapid heartbeat.  You have an irregular heartbeat.  You have difficulty breathing. Summary  The thyroid gland is a small gland located in the lower front part of the neck, just in front of the windpipe (trachea).  Hyperthyroidism is when the thyroid gland is too active (overactive) and produces too much of a hormone called thyroxine.  The most common cause is Graves' disease, a disorder in which your immune system attacks the thyroid gland.  Hyperthyroidism can cause various symptoms, such as unexplained weight loss, nervousness, inability to tolerate heat, or changes in your heartbeat.  Treatment may include medicine to reduce the  amount of thyroid hormone your body makes, radioiodine therapy, surgery, or medicines to manage symptoms. This information is not intended to replace advice given to you by your health care provider. Make sure you discuss any questions you have with your health care provider. Document  Revised: 09/09/2017 Document Reviewed: 09/07/2017 Elsevier Patient Education  [employer].    An after visit summary with all of these plans was given to the patient.

## 2019-12-31 NOTE — Patient Instructions (Addendum)
Please follow up with the Endocrinologist    If you have lab work done today you will be contacted with your lab results within the next 2 weeks.  If you have not heard from [place] then please contact us. The fastest way to get your results is to register for My Chart.   IF you received an x-ray today, you will receive an invoice from [facility] Radiology. Please contact [facility] Radiology at [phone] with questions or concerns regarding your invoice.   IF you received labwork today, you will receive an invoice from [facility]. Please contact [facility] at [phone] with questions or concerns regarding your invoice.   Our billing staff will not be able to assist you with questions regarding bills from these companies.  You will be contacted with the lab results as soon as they are available. The fastest way to get your results is to activate your My Chart account. Instructions are located on the last page of this paperwork. If you have not heard from [place] regarding the results in 2 weeks, please contact this office.     Hyperthyroidism  Hyperthyroidism is when the thyroid gland is too active (overactive). The thyroid gland is a small gland located in the lower front part of the neck, just in front of the windpipe (trachea). This gland makes hormones that help control how the body uses food for energy (metabolism) as well as how the heart and brain function. These hormones also play a role in keeping your bones strong. When the thyroid is overactive, it produces too much of a hormone called thyroxine. What are the causes? This condition may be caused by:  Graves' disease. This is a disorder in which the body's disease-fighting system (immune system) attacks the thyroid gland. This is the most common cause.  Inflammation of the thyroid gland.  A tumor in the thyroid gland.  Use of certain medicines, including: ? Prescription thyroid hormone replacement. ? Herbal supplements that mimic  thyroid hormones. ? Amiodarone therapy.  Solid or fluid-filled lumps within your thyroid gland (thyroid nodules).  Taking in a large amount of iodine from foods or medicines. What increases the risk? You are more likely to develop this condition if:  You are female.  You have a family history of thyroid conditions.  You smoke tobacco.  You use a medicine called lithium.  You take medicines that affect the immune system (immunosuppressants). What are the signs or symptoms? Symptoms of this condition include:  Nervousness.  Inability to tolerate heat.  Unexplained weight loss.  Diarrhea.  Change in the texture of hair or skin.  Heart skipping beats or making extra beats.  Rapid heart rate.  Loss of menstruation.  Shaky hands.  Fatigue.  Restlessness.  Sleep problems.  Enlarged thyroid gland or a lump in the thyroid (nodule). You may also have symptoms of Graves' disease, which may include:  Protruding eyes.  Dry eyes.  Red or swollen eyes.  Problems with vision. How is this diagnosed? This condition may be diagnosed based on:  Your symptoms and medical history.  A physical exam.  Blood tests.  Thyroid ultrasound. This test involves using sound waves to produce images of the thyroid gland.  A thyroid scan. A radioactive substance is injected into a vein, and images show how much iodine is present in the thyroid.  Radioactive iodine uptake test (RAIU). A small amount of radioactive iodine is given by mouth to see how much iodine the thyroid absorbs after a certain amount of  time. How is this treated? Treatment depends on the cause and severity of the condition. Treatment may include:  Medicines to reduce the amount of thyroid hormone your body makes.  Radioactive iodine treatment (radioiodine therapy). This involves swallowing a small dose of radioactive iodine, in capsule or liquid form, to kill thyroid cells.  Surgery to remove part or all of  your thyroid gland. You may need to take thyroid hormone replacement medicine for the rest of your life after thyroid surgery.  Medicines to help manage your symptoms. Follow these instructions at home:   Take over-the-counter and prescription medicines only as told by your health care provider.  Do not use any products that contain nicotine or tobacco, such as cigarettes and e-cigarettes. If you need help quitting, ask your health care provider.  Follow any instructions from your health care provider about diet. You may be instructed to limit foods that contain iodine.  Keep all follow-up visits as told by your health care provider. This is important. ? You will need to have blood tests regularly so that your health care provider can monitor your condition. Contact a health care provider if:  Your symptoms do not get better with treatment.  You have a fever.  You are taking thyroid hormone replacement medicine and you: ? Have symptoms of depression. ? Feel like you are tired all the time. ? Gain weight. Get help right away if:  You have chest pain.  You have decreased alertness or a change in your awareness.  You have abdominal pain.  You feel dizzy.  You have a rapid heartbeat.  You have an irregular heartbeat.  You have difficulty breathing. Summary  The thyroid gland is a small gland located in the lower front part of the neck, just in front of the windpipe (trachea).  Hyperthyroidism is when the thyroid gland is too active (overactive) and produces too much of a hormone called thyroxine.  The most common cause is Graves' disease, a disorder in which your immune system attacks the thyroid gland.  Hyperthyroidism can cause various symptoms, such as unexplained weight loss, nervousness, inability to tolerate heat, or changes in your heartbeat.  Treatment may include medicine to reduce the amount of thyroid hormone your body makes, radioiodine therapy, surgery, or  medicines to manage symptoms. This information is not intended to replace advice given to you by your health care provider. Make sure you discuss any questions you have with your health care provider. Document Revised: 09/09/2017 Document Reviewed: 09/07/2017 Elsevier Patient Education  2020 [employer].

## 2020-01-01 LAB — TSH+T4F+T3FREE
Free T4: 0.44 ng/dL — ABNORMAL LOW (ref 0.82–1.77)
T3, Free: 1.1 pg/mL — ABNORMAL LOW (ref 2.0–4.4)
TSH: 0.041 u[IU]/mL — ABNORMAL LOW (ref 0.450–4.500)

## 2020-01-01 LAB — LIPID PANEL
Chol/HDL Ratio: 3.8 ratio (ref 0.0–4.4)
Cholesterol, Total: 197 mg/dL (ref 100–199)
HDL: 52 mg/dL (ref >39)
LDL Chol Calc (NIH): 122 mg/dL — ABNORMAL HIGH (ref 0–99)
Triglycerides: 130 mg/dL (ref 0–149)
VLDL Cholesterol Cal: 23 mg/dL (ref 5–40)

## 2020-01-08 DIAGNOSIS — H30031 Focal chorioretinal inflammation, peripheral, right eye: Secondary | ICD-10-CM | POA: Diagnosis not present

## 2020-01-08 DIAGNOSIS — H18421 Band keratopathy, right eye: Secondary | ICD-10-CM | POA: Diagnosis not present

## 2020-01-08 DIAGNOSIS — Z961 Presence of intraocular lens: Secondary | ICD-10-CM | POA: Diagnosis not present

## 2020-01-08 DIAGNOSIS — H179 Unspecified corneal scar and opacity: Secondary | ICD-10-CM | POA: Diagnosis not present

## 2020-01-08 DIAGNOSIS — H35371 Puckering of macula, right eye: Secondary | ICD-10-CM | POA: Diagnosis not present

## 2020-01-08 LAB — HM DIABETES EYE EXAM

## 2020-01-17 DIAGNOSIS — H903 Sensorineural hearing loss, bilateral: Secondary | ICD-10-CM | POA: Diagnosis not present

## 2020-01-17 DIAGNOSIS — H9313 Tinnitus, bilateral: Secondary | ICD-10-CM | POA: Diagnosis not present

## 2020-01-28 ENCOUNTER — Other Ambulatory Visit: Payer: Self-pay

## 2020-01-28 ENCOUNTER — Ambulatory Visit (INDEPENDENT_AMBULATORY_CARE_PROVIDER_SITE_OTHER): Payer: Medicare Other | Admitting: Infectious Disease

## 2020-01-28 ENCOUNTER — Encounter: Payer: Self-pay | Admitting: Infectious Disease

## 2020-01-28 ENCOUNTER — Ambulatory Visit: Payer: Medicare Other | Admitting: Family Medicine

## 2020-01-28 VITALS — Wt 130.0 lb

## 2020-01-28 DIAGNOSIS — Z8619 Personal history of other infectious and parasitic diseases: Secondary | ICD-10-CM

## 2020-01-28 DIAGNOSIS — A539 Syphilis, unspecified: Secondary | ICD-10-CM

## 2020-01-28 DIAGNOSIS — Z114 Encounter for screening for human immunodeficiency virus [HIV]: Secondary | ICD-10-CM | POA: Diagnosis not present

## 2020-01-28 DIAGNOSIS — H3091 Unspecified chorioretinal inflammation, right eye: Secondary | ICD-10-CM

## 2020-01-28 DIAGNOSIS — Z7251 High risk heterosexual behavior: Secondary | ICD-10-CM

## 2020-01-28 HISTORY — DX: Syphilis, unspecified: A53.9

## 2020-01-28 HISTORY — DX: Unspecified chorioretinal inflammation, right eye: H30.91

## 2020-01-28 NOTE — Progress Notes (Signed)
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 Manuella Ghazi Subjective:    Patient ID: [name], female    DOB: Nov 28, 1945, 74 y.o.   MRN: [id number]  HPI  74 year old African-American woman with hearing loss who has had chronic visual loss as well and recently underwentis s/p EDTA chelation OD 10/22/19 with Dr. Susa Simmonds and mature white cataract OS.   She was later seen by Dr. 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  He found findings in the right eye significant for's scarring inflammation with necrotizing neurosyphilis being a possibility.  He ordered a myriad of blood test which for the most part were unrevealing including HIV negative antibody, from gold being negative.  The plasma IgG was positive as well.  RPR was positive a titer of 1-1.  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  In talking to the patient and her son who translates for her due to her being deaf and unable to see very well it became apparent that this patient had actually been seen at [facility] apparently nearly 2 decades ago and at that time was having visual problems as well.  The son states that the patient received treatment for syphilis but is unsure if she "completed treatment" he is also not sure whether she received IV penicillin or intramuscular penicillin.    Review of Systems  Unable to perform ROS: Dementia       Objective:   Physical Exam Constitutional:      General: She is not in acute distress.    Appearance: Normal appearance. She is well-developed. She is not ill-appearing or diaphoretic.  HENT:     Head: Normocephalic.     Right Ear: Hearing and external ear normal.     Left Ear: Hearing and external ear normal.     Nose: No nasal deformity or rhinorrhea.  Eyes:     General: No scleral icterus.  Extraocular Movements: Extraocular movements intact.     Conjunctiva/sclera:     Right eye: Right conjunctiva is not injected.     Left eye: Left conjunctiva is not injected.  Neck:     Vascular: No JVD.  Cardiovascular:     Rate and Rhythm: Normal rate and regular rhythm.     Heart sounds: Normal heart sounds, S1 normal and S2 normal. No murmur. No friction rub.  Abdominal:     General: Bowel sounds are normal. There is no distension.     Palpations: Abdomen is soft.     Tenderness: There is no abdominal tenderness.  Musculoskeletal:        General: Normal range of motion.     Right shoulder: Normal.     Left shoulder: Normal.     Cervical back: Normal range of motion and neck supple.     Right hip: Normal.     Left hip: Normal.     Right knee: Normal.     Left knee: Normal.  Lymphadenopathy:     Head:     Right side of head: No submandibular, preauricular or posterior auricular adenopathy.     Left side of head: No submandibular, preauricular or posterior auricular adenopathy.     Cervical: No cervical adenopathy.     Right cervical: No superficial or deep cervical adenopathy.    Left cervical: No superficial or deep cervical adenopathy.  Skin:    General: Skin  is warm and dry.     Coloration: Skin is not pale.     Findings: No abrasion, bruising, ecchymosis, erythema, lesion or rash.     Nails: There is no clubbing.  Neurological:     General: No focal deficit present.     Mental Status: She is alert and oriented to person, place, and time.     Sensory: No sensory deficit.     Coordination: Coordination normal.     Gait: Gait normal.  Psychiatric:        Attention and Perception: Attention normal. She is attentive.        Mood and Affect: Mood is anxious and depressed. Affect is tearful.        Speech: Speech is delayed.        Behavior: Behavior is cooperative.        Cognition and Memory: Memory is impaired.           Assessment & Plan:  Chorioretinitis: I  am myself highly skeptical of this patient has active syphilis causing her chorioretinitis.  I wonder if all of the findings on ophthalmologic exam may represent prior pathology that was treated.  Certainly I have never myself personally seen a case of ocular syphilis or neurosyphilis with a titer of 1-1 typically I have always seen much higher titers.  Furthermore per the son's history the patient did receive treatment at [facility] in the past.  It also does not appear clear that the patient has had a new or abrupt change in her vision to substantiate it idea of active syphilitic infection.  That being said I am willing certainly confer with my partners and other infectious disease experts with regards to the possibility the patient could have chorioretinitis syphilis with a titer of 1-1.  If that seems like a reasonable possibility I am certainly willing to give her 2 weeks of IV penicillin.  However certainly my clinical judgment lead me to believe that this patient does not have ocular syphilis and that we would be imposing risk with intravenous access of DVT and infection that would not be necessary. We will send some additional labs for CMV serologies, HIV RNA though acute HIV seems unlikely and herpes simplex antibodies.  I am happy  to talk to ophthalmology to further discuss and will reach out to Dr. Manuella Ghazi at [facility].

## 2020-01-28 NOTE — Patient Instructions (Addendum)
I do not think the visual problems are due to syphilis but will confer with my colleagues and other experts  If we feel there is a chance that this really could be due to syphilis we would give you 2 weeks of high dose IV penicillni  We will do additional labs today  We will then make followup labs accordingly

## 2020-01-29 ENCOUNTER — Other Ambulatory Visit: Payer: Self-pay

## 2020-01-29 ENCOUNTER — Telehealth: Payer: Self-pay | Admitting: Infectious Disease

## 2020-01-29 ENCOUNTER — Ambulatory Visit (INDEPENDENT_AMBULATORY_CARE_PROVIDER_SITE_OTHER): Payer: Medicare Other | Admitting: Family Medicine

## 2020-01-29 VITALS — BP 154/85 | HR 61 | Temp 97.8°F | Ht 64.0 in | Wt 129.2 lb

## 2020-01-29 DIAGNOSIS — E2740 Unspecified adrenocortical insufficiency: Secondary | ICD-10-CM

## 2020-01-29 DIAGNOSIS — E0590 Thyrotoxicosis, unspecified without thyrotoxic crisis or storm: Secondary | ICD-10-CM

## 2020-01-29 DIAGNOSIS — H9193 Unspecified hearing loss, bilateral: Secondary | ICD-10-CM | POA: Diagnosis not present

## 2020-01-29 DIAGNOSIS — H3091 Unspecified chorioretinal inflammation, right eye: Secondary | ICD-10-CM

## 2020-01-29 DIAGNOSIS — I10 Essential (primary) hypertension: Secondary | ICD-10-CM

## 2020-01-29 DIAGNOSIS — A539 Syphilis, unspecified: Secondary | ICD-10-CM

## 2020-01-29 NOTE — Addendum Note (Signed)
Addended by: Andree Coss on: 01/29/2020 12:52 PM   Modules accepted: Orders

## 2020-01-29 NOTE — Progress Notes (Signed)
Established Patient Office Visit  Subjective:  Patient ID: [name], female    DOB: April 08, 1946  Age: 74 y.o. MRN: [id number]  CC:  Chief Complaint  Patient presents with  . Follow-up    x1 month on medical conditions    HPI [name] presents for   Hypertension: Patient here for follow-up of elevated blood pressure. She is exercising and is adherent to low salt diet.  Blood pressure is well controlled at home. Cardiac symptoms none. Patient denies chest pain, chest pressure/discomfort, fatigue and irregular heart beat.  Cardiovascular risk factors: hypertension and female gender. Use of agents associated with hypertension: steroids. History of target organ damage: none.  BP Readings from Last 3 Encounters:  01/29/20 (!) 154/85  12/31/19 (!) 176/60  12/25/19 (!) 183/68   Hearing loss Patient had evaluation and they are deciding not to go forward with Cochlear implant Due to the long standing    Adrenal Insufficiency and Hyperthyroidism She has not gotten a appointment with Endocrinology as yet. She has hyperthyroidism and adrenal insufficiency She is taking her medication.   Lab Results  Component Value Date   TSH 0.041 (L) 12/31/2019     Past Medical History:  Diagnosis Date  . Adrenal insufficiency (Addison's disease) ([place])   . 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 Hypertension   . Syphilis 01/28/2020  . Thyroid disease    hypothyroidism  . Type 2 diabetes mellitus ([place])     Past Surgical History:  Procedure Laterality Date  . EYE SURGERY    . FEMUR IM NAIL Left 02/02/2018  . FEMUR IM NAIL Left 02/02/2018   Procedure: INTRAMEDULLARY (IM) NAIL LEFT FEMUR;  Surgeon: Shona Needles, MD;  Location: [facility];  Service: Orthopedics;  Laterality: Left;    Family History  Problem Relation Age of Onset  . Vitiligo Son     Social History   Socioeconomic History  . Marital status: Divorced    Spouse name: Not on file  . Number of  children: Not on file  . Years of education: Not on file  . Highest education level: Not on file  Occupational History  . Not on file  Tobacco Use  . Smoking status: Former Research scientist (life sciences)  . Smokeless tobacco: Never Used  Substance and Sexual Activity  . Alcohol use: Never  . Drug use: Never  . Sexual activity: Not on file  Other Topics Concern  . Not on file  Social History Narrative  . Not on file   Social Determinants of Health   Financial Resource Strain:   . Difficulty of Paying Living Expenses:   Food Insecurity:   . Worried About Charity fundraiser in the Last Year:   . Arboriculturist in the Last Year:   Transportation Needs:   . Film/video editor (Medical):   Marland Kitchen Lack of Transportation (Non-Medical):   Physical Activity:   . Days of Exercise per Week:   . Minutes of Exercise per Session:   Stress:   . Feeling of Stress :   Social Connections:   . Frequency of Communication with Friends and Family:   . Frequency of Social Gatherings with Friends and Family:   . Attends Religious Services:   . Active Member of Clubs or Organizations:   . Attends Archivist Meetings:   Marland Kitchen Marital Status:   Intimate Partner Violence:   . Fear of Current or Ex-Partner:   . Emotionally Abused:   Marland Kitchen Physically  Abused:   . Sexually Abused:     Outpatient Medications Prior to Visit  Medication Sig Dispense Refill  . escitalopram (LEXAPRO) 10 MG tablet Take 1 tablet (10 mg total) by mouth daily. 30 tablet 3  . hydrochlorothiazide (HYDRODIURIL) 25 MG tablet Take 1 tablet (25 mg total) by mouth daily. 30 tablet 3  . hydrocortisone (CORTEF) 20 MG tablet Take 1/2 (one-half) tablet by mouth once daily 45 tablet 3  . Multiple Vitamins-Minerals (CENTRUM SILVER 50+WOMEN) TABS Take 1 tablet by mouth daily.    . polyethylene glycol (MIRALAX / GLYCOLAX) packet Take 17 g by mouth 2 (two) times daily. (until having soft bowel movement daily, then use as needed or daily) 14 each 0  .  prednisoLONE acetate (PRED FORTE) 1 % ophthalmic suspension INSTILL 1 DROP INTO EACH EYE TWICE DAILY 5 mL 6  . Propylene Glycol (SYSTANE BALANCE OP) Apply 2 drops to eye 3 (three) times daily as needed (dry eyes).     . sodium chloride (MURO 128) 2 % ophthalmic solution Place 1 drop into both eyes 2 (two) times daily. 15 mL 0   No facility-administered medications prior to visit.    No Known Allergies  ROS Review of Systems See hpi   Objective:    Physical Exam  BP (!) 154/85 (BP Location: Left Arm, Patient Position: Sitting, Cuff Size: Normal)   Pulse 61   Temp 97.8 F (36.6 C) (Temporal)   Ht 5\' 4"  (1.626 m)   Wt 129 lb 3.2 oz (58.6 kg)   SpO2 100%   BMI 22.18 kg/m  Wt Readings from Last 3 Encounters:  01/29/20 129 lb 3.2 oz (58.6 kg)  01/28/20 130 lb (59 kg)  12/31/19 131 lb (59.4 kg)   Physical Exam  Constitutional: Oriented to person, place, and time. Appears well-developed and well-nourished. Visual impairment HENT:  Head: Normocephalic and atraumatic.  Eyes: Conjunctivae and EOM are normal. Visible calcifications Neck: no thyromegaly, neck supple Cardiovascular: Normal rate, regular rhythm, normal heart sounds and intact distal pulses.  No murmur heard. Pulmonary/Chest: Effort normal and breath sounds normal. No stridor. No respiratory distress. Has no wheezes.  Neurological: Is alert and oriented to person, place, and time.  Skin: Skin is warm. Capillary refill takes less than 2 seconds.  Psychiatric: Has a normal mood and affect. Behavior is normal. Judgment and thought content normal.    Health Maintenance Due  Topic Date Due  . COVID-19 Vaccine (1) Never done  . TETANUS/TDAP  Never done  . COLONOSCOPY  Never done  . DEXA SCAN  Never done  . PNA vac Low Risk Adult (1 of 2 - PCV13) Never done    There are no preventive care reminders to display for this patient.  Lab Results  Component Value Date   TSH 0.041 (L) 12/31/2019   Lab Results  Component  Value Date   WBC 7.5 12/25/2019   HGB 10.3 (L) 12/25/2019   HCT 31.1 (L) 12/25/2019   MCV 85.4 12/25/2019   PLT 197 12/25/2019   Lab Results  Component Value Date   NA 141 12/25/2019   K 3.4 (L) 12/25/2019   CO2 26 12/25/2019   GLUCOSE 101 (H) 12/25/2019   BUN 17 12/25/2019   CREATININE 1.48 (H) 12/25/2019   BILITOT 0.5 12/25/2019   ALKPHOS 48 12/25/2019   AST 34 12/25/2019   ALT 19 12/25/2019   PROT 6.1 (L) 12/25/2019   ALBUMIN 3.6 12/25/2019   CALCIUM 8.9 12/25/2019   ANIONGAP  10 12/25/2019   Lab Results  Component Value Date   CHOL 197 12/31/2019   Lab Results  Component Value Date   HDL 52 12/31/2019   Lab Results  Component Value Date   LDLCALC 122 (H) 12/31/2019   Lab Results  Component Value Date   TRIG 130 12/31/2019   Lab Results  Component Value Date   CHOLHDL 3.8 12/31/2019   Lab Results  Component Value Date   HGBA1C 6.1 (A) 12/31/2019      Assessment & Plan:   Problem List Items Addressed This Visit      Endocrine   Adrenal insufficiency (HCC) - will refer to Endocrinology to discussed her adrenal insufficiency    Other Visit Diagnoses    Uncontrolled hypertension    -  Primary Improving    Bilateral deafness    -  Reviewed specialist notes   Hyperthyroidism    -  Discussed referral Concern that patient is still taking her levothyroxine which she was advised that she no longer needs       No orders of the defined types were placed in this encounter.   Follow-up: Return in about 4 weeks (around 02/26/2020) for Thyroid visit virtually as a double book.    Doristine Bosworth, MD

## 2020-01-29 NOTE — Telephone Encounter (Signed)
Spoke with Dr.Shah discussed case with him.  He says the patient has a lot of scarring and there are but also a lot of inflammation.  He says there are some cases in literature of patients like this who have had a history of syphilis even with low titers with no treatment history of IV penicillin who have inflammation in the eyes and have been given a diagnosis of neurosyphilis.  I found similar literature without regards to neurosyphilis and ocular syphilis and maintain skepticism.  However I think that if there is a chance the patient's vision could improve and it would help clarify what is going on her eye then I think giving 2 weeks of IV penicillin is not unreasonable.  Therefore I am going to try to get her set up with a PICC line and penicillin 25 4,000,000 units/day x 14 days.  I called one of her sons who is on her phone number and his name is   Gregary Signs but he would also like me to talk to older brother Kimber Relic [phone]  Call him around 10:00.

## 2020-01-29 NOTE — Telephone Encounter (Signed)
Order for PICC placement entered.  RN faxed demographics, insurance information, office note, written order for  to [facility] for home health coordination.  Patient has no documented penicillin administration, will need first dose at Short Stay. Please advise

## 2020-01-29 NOTE — Patient Instructions (Addendum)
[facility]  [phone]  You have  Adrenal Insufficiency and Hyperthyroidism May 6 at [facility] at 8:10am

## 2020-01-31 ENCOUNTER — Telehealth: Payer: Self-pay | Admitting: *Deleted

## 2020-01-31 ENCOUNTER — Other Ambulatory Visit: Payer: Self-pay | Admitting: Infectious Disease

## 2020-01-31 NOTE — Telephone Encounter (Signed)
Left message for patient's son [name] asking him to call back regarding appointment for his mother. [name] is scheduled for PICC placement Tuesday 4/27 at 2:00 (1:45 arrival) at [facility]. Andree Coss, RN

## 2020-01-31 NOTE — Telephone Encounter (Signed)
Orders received. PICC placement Faxed to [facility]. Short stay orders faxed to  Andree Coss, RN

## 2020-01-31 NOTE — Addendum Note (Signed)
Addended by: Andree Coss on: 01/31/2020 12:21 PM   Modules accepted: Orders

## 2020-02-01 NOTE — Telephone Encounter (Signed)
Confirmed with [name], notified Debbie at [facility].  Per Eunice Blase, patient's daily copay for penicillin is $50+/day. She was not able to get in touch with a family member to notify them. RN gave her [name]'s telephone number to relay to him. Andree Coss, RN

## 2020-02-04 LAB — OTHER LAB TEST
Miscellaneous Test Results: 26.2
NORMAL RANGE:: <=0.89
PRICE:: 77

## 2020-02-04 LAB — ARUP MISC ORDER: PRICE:: 80

## 2020-02-04 LAB — MISCELLANEOUS LAB ORDER 2
Miscellaneous Test Results: 12.8
NORMAL RANGE:: <=0.89
PRICE:: 77

## 2020-02-04 LAB — VARICELLA ZOSTER ANTIBODY, IGG: Varicella IgG: 1552 index

## 2020-02-04 NOTE — Telephone Encounter (Signed)
Received notification from [facility] pharmacy letting us know that the PICC placement was rescheduled by family to June 7th. Please advise. Andree Coss, RN

## 2020-02-04 NOTE — Telephone Encounter (Signed)
That is unacceptably late if whole point is to improve her vision with IV abx waiting greater thana  month makes no sense. I can admit her to the hospital if we want to go that route--certainly not cheaper

## 2020-02-05 ENCOUNTER — Ambulatory Visit (HOSPITAL_COMMUNITY): Payer: Medicare Other

## 2020-02-05 NOTE — Telephone Encounter (Signed)
Other option is doxycycline 100 mg Bid x 28 days

## 2020-02-05 NOTE — Telephone Encounter (Signed)
Understood but her optho thinks it may be the case and DOES want her treated we could do Ceftriaxone insteawd if they could do that

## 2020-02-05 NOTE — Telephone Encounter (Signed)
I left a message for son Kimber Relic (the one who came to the appointment), spoke with son Terrick.  The brothers are concerned that their mom will need more supervision or assistance while on the IV. The 3rd brother Sidonie Dickens is a Engineer, site and can assist his mother after the school year is over, so they rescheduled the PICC to June.  They were under the impression that there is not a big rush for treatment, that it may not even help. Please advise?

## 2020-02-05 NOTE — Telephone Encounter (Signed)
Per the sons, the issue is the PICC line in combination with her living alone with sensory deficits.

## 2020-02-06 ENCOUNTER — Other Ambulatory Visit: Payer: Self-pay | Admitting: Infectious Disease

## 2020-02-06 MED ORDER — DOXYCYCLINE HYCLATE 100 MG PO TABS: 100.0000 mg | ORAL_TABLET | Freq: Two times a day (BID) | ORAL | 0 refills | Status: AC

## 2020-02-06 NOTE — Telephone Encounter (Signed)
I called and left VM for son. I sent doxy to pharmacy  on [address]. We should also let Dr. Sherryll Burger know the patient does not want to do IV antibiotics

## 2020-02-06 NOTE — Telephone Encounter (Signed)
Patient will need call with this oral treatment option, will need to know pharmacy choice.   Please call her son Aziel with update. Andree Coss, RN

## 2020-02-08 ENCOUNTER — Ambulatory Visit (HOSPITAL_COMMUNITY): Payer: Medicare Other

## 2020-02-08 NOTE — Telephone Encounter (Signed)
Unable to get in touch with providers office. Lorenso Courier, [place]

## 2020-02-12 ENCOUNTER — Other Ambulatory Visit: Payer: Self-pay

## 2020-02-14 ENCOUNTER — Encounter: Payer: Self-pay | Admitting: Internal Medicine

## 2020-02-14 ENCOUNTER — Ambulatory Visit (INDEPENDENT_AMBULATORY_CARE_PROVIDER_SITE_OTHER): Payer: Medicare Other | Admitting: Internal Medicine

## 2020-02-14 ENCOUNTER — Other Ambulatory Visit (INDEPENDENT_AMBULATORY_CARE_PROVIDER_SITE_OTHER): Payer: Medicare Other

## 2020-02-14 ENCOUNTER — Other Ambulatory Visit: Payer: Self-pay

## 2020-02-14 VITALS — BP 118/78 | HR 54 | Ht 64.0 in | Wt 128.6 lb

## 2020-02-14 DIAGNOSIS — E2740 Unspecified adrenocortical insufficiency: Secondary | ICD-10-CM | POA: Diagnosis not present

## 2020-02-14 DIAGNOSIS — E230 Hypopituitarism: Secondary | ICD-10-CM | POA: Diagnosis not present

## 2020-02-14 DIAGNOSIS — E038 Other specified hypothyroidism: Secondary | ICD-10-CM

## 2020-02-14 LAB — FOLLICLE STIMULATING HORMONE: FSH: 1.8 m[IU]/mL

## 2020-02-14 LAB — BASIC METABOLIC PANEL
BUN: 17 mg/dL (ref 6–23)
CO2: 29 mEq/L (ref 19–32)
Calcium: 9.5 mg/dL (ref 8.4–10.5)
Chloride: 101 mEq/L (ref 96–112)
Creatinine, Ser: 1.45 mg/dL — ABNORMAL HIGH (ref 0.40–1.20)
GFR: 42.58 mL/min — ABNORMAL LOW (ref >60.00)
Glucose, Bld: 89 mg/dL (ref 70–99)
Potassium: 3.3 mEq/L — ABNORMAL LOW (ref 3.5–5.1)
Sodium: 138 mEq/L (ref 135–145)

## 2020-02-14 LAB — LUTEINIZING HORMONE: LH: 0.35 m[IU]/mL

## 2020-02-14 LAB — TSH: TSH: 0.08 u[IU]/mL — ABNORMAL LOW (ref 0.35–4.50)

## 2020-02-14 LAB — T4, FREE: Free T4: 0.35 ng/dL — ABNORMAL LOW (ref 0.60–1.60)

## 2020-02-14 MED ORDER — HYDROCORTISONE 10 MG PO TABS: ORAL_TABLET | ORAL | 3 refills | Status: DC

## 2020-02-14 MED ORDER — LEVOTHYROXINE SODIUM 50 MCG PO TABS: 50.0000 ug | ORAL_TABLET | Freq: Every day | ORAL | 1 refills | Status: DC

## 2020-02-14 NOTE — Patient Instructions (Addendum)
-  Start Levothyroxine 50 mcg daily  - Hydrocortisone 10 mg with Breakfast and 5 mg in the afternoon between 2-4 pm    ADRENAL INSUFFICIENCY SICK DAY RULES:  Should you face an extreme emotional or physical stress such as trauma, surgery or acute illness, this will require extra steroid coverage so that the body can meet that stress.   Without increasing the steroid dose you may experience severe weakness, headache, dizziness, nausea and vomiting and possibly a more serious deterioration in health.  Typically the dose of steroids will only need to be increased for a couple of days if you have an illness that is transient and managed in the community.   If you are unable to take/absorb an increased dose of steroids orally because of vomiting or diarrhea, you will urgently require steroid injections and should present to an Emergency Department.  The general advice for any serious illness is as follows: 1. Double the normal daily steroid dose for up to 3 days if you have a temperature of more than 37.50C (99.31F) with signs of sickness, or severe emotional or physical distress 2. Contact your primary care doctor and Endocrinologist if the illness worsens or it lasts for more than 3 days.  3. In cases of severe illness, urgent medical assistance should be promptly sought. 4. If you experience vomiting/diarrhea or are unable to take steroids by mouth, please administer the Hydrocortisone injection kit and seek urgent medical help.

## 2020-02-14 NOTE — Progress Notes (Signed)
Name: [name]  MRN/ DOB: [id number], 20-Dec-1945    Age/ Sex: 73 y.o., female    PCP: Doristine Bosworth, MD   Reason for Endocrinology Evaluation: Adrenal insufficieny/ low TSH     Date of Initial Endocrinology Evaluation: 02/14/2020     HPI: [name] is a 74 y.o. female with a past medical history of Deafness, and adrenal insufficiency. The patient presented for initial endocrinology clinic visit on 02/14/2020 for consultative assistance with her low TSH .     She is accompanied by her son Joanne Signs) today, who provided history , pt is deaf and visually impaired with very limited history.   In review of her records she has been diagnosed with hypothyroidism > 20 yrs ago, she was on LT-4 replacement until 06/2019 when this was discontinued due to persistently low TSH.  Pt noted thyromegaly while off levothyroxine and had restarted taking her old prescription three times a week which has helped with thyromegaly.    She has been diagnosed with adrenal insufficiency sometime between 2005 and 2019 as this appeared in her chart in 2019 when she was admitted for a fall . She is on  HC 20 mg tablets , taking half a tablet once  daily   She has no head injury or prior exposure to radiation but was admitted in 2005 with septic shock after being admitted for AMS and fever, requiring intubation   She has been noted with weight loss per son Per son also there's no reported headaches, change in bowel movements nor depression or anxiety.    She is not on biotin   Son  with thyroid disease No  Steroid intake other than HC    HISTORY:  Past Medical History:  Past Medical History:  Diagnosis Date  . Adrenal insufficiency (Addison's disease) (HCC)   . 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 Hypertension   . Syphilis 01/28/2020  . Thyroid disease    hypothyroidism  . Type 2 diabetes mellitus (HCC)     Past Surgical History:  Past Surgical History:  Procedure  Laterality Date  . EYE SURGERY    . FEMUR IM NAIL Left 02/02/2018  . FEMUR IM NAIL Left 02/02/2018   Procedure: INTRAMEDULLARY (IM) NAIL LEFT FEMUR;  Surgeon: Roby Lofts, MD;  Location: [facility];  Service: Orthopedics;  Laterality: Left;      Social History:  reports that she has quit smoking. She has never used smokeless tobacco. She reports that she does not drink alcohol or use drugs.  Family History: family history includes Vitiligo in her son.   HOME MEDICATIONS: Allergies as of 02/14/2020   No Known Allergies     Medication List       Accurate as of Feb 14, 2020  8:10 AM. If you have any questions, ask your nurse or doctor.        Centrum Silver 50+Women Tabs Take 1 tablet by mouth daily.   doxycycline 100 MG tablet Commonly known as: VIBRA-TABS Take 1 tablet (100 mg total) by mouth 2 (two) times daily for 28 days.   escitalopram 10 MG tablet Commonly known as: Lexapro Take 1 tablet (10 mg total) by mouth daily.   hydrochlorothiazide 25 MG tablet Commonly known as: HYDRODIURIL Take 1 tablet (25 mg total) by mouth daily.   hydrocortisone 20 MG tablet Commonly known as: CORTEF Take 1/2 (one-half) tablet by mouth once daily   polyethylene glycol 17 g packet Commonly known as:  MIRALAX / GLYCOLAX Take 17 g by mouth 2 (two) times daily. (until having soft bowel movement daily, then use as needed or daily)   prednisoLONE acetate 1 % ophthalmic suspension Commonly known as: PRED FORTE INSTILL 1 DROP INTO EACH EYE TWICE DAILY   sodium chloride 2 % ophthalmic solution Commonly known as: MURO 128 Place 1 drop into both eyes 2 (two) times daily.   SYSTANE BALANCE OP Apply 2 drops to eye 3 (three) times daily as needed (dry eyes).         REVIEW OF SYSTEMS: A comprehensive ROS was conducted with the patient and is negative except as per HPI    OBJECTIVE:  VS: BP 118/78 (BP Location: Left Arm, Patient Position: Sitting, Cuff Size: Normal)   Pulse (!) 54    Ht 5\' 4"  (1.626 m)   Wt 128 lb 9.6 oz (58.3 kg)   SpO2 98%   BMI 22.07 kg/m     Body surface area is 1.62 meters squared.  Wt Readings from Last 3 Encounters:  02/14/20 128 lb 9.6 oz (58.3 kg)  01/29/20 129 lb 3.2 oz (58.6 kg)  01/28/20 130 lb (59 kg)     EXAM: General: Pt appears well and is in NAD  Neck: General: Supple without adenopathy. Thyroid: Thyroid size normal.  No goiter or nodules appreciated.   Lungs: Clear with good BS bilat with no rales, rhonchi, or wheezes  Heart: Auscultation: RRR.  Abdomen: Normoactive bowel sounds, soft, nontender, without masses or organomegaly palpable  Extremities:  BL LE: No pretibial edema normal ROM and strength.  Skin: Hair: Texture and amount normal with gender appropriate distribution Skin Inspection: No rashes Skin Palpation: Skin temperature, texture, and thickness normal to palpation  Mental Status: Judgment, insight: Intact Mood and affect: No depression, anxiety, or agitation     DATA REVIEWED:   Results for [name], [name] (MRN [id number]) as of 02/15/2020 09:17  Ref. 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 No prior hx of head trauma or radiation.  - She had a CT scan of the brain in 03/2019 with no mention of pituitary pathology, of note this was not a dedicated pituitary scan.  Will consider a dedicated pituitary MRI in the future , not sure if she actually has an empty sella , but the stalk seems to be normal.  - Her FSH, LH   And prolactin are abnormally low  - TSH is low with low FT4 and T3 - Will obtain ACTH , pt is on suboptimals hydrocortisone replacement , could shed some light as if this is primary vs secondary  - Will obtain IGF- 1 and prolactin  levels    2. Secondary Hypothyroidism:   - Pt is clinically hypothyroid - TSH should not be used in making any LT- 4 replacement adjustments due to hypopituitarism - Will start LT- 4 replacement as below     Medications : Levothyroxine 50 mcg daily  3. Adrenal Insufficiency :   - I suspect this is secondary given her hypopituitarism - She is not on enough corticosteroid replacement at this time - Will adjust HC as below - I have explained to the son the importance of HC replacement, when to take it , we also discussed the sick day rules and that insufficient intake of hydrocortisone could be fatal to the pt    Medications:  Hydrocortisone 10 mg with breakfast and 5 mg between 2-4 pm    F/U in 3 months  Labs in 6 weeks    Signed electronically by: Lyndle Herrlich, MD  [facility] Endocrinology  [facility] [address] [place] [place], [place] [address] Phone: [phone] FAX: [phone]   CC: Doristine Bosworth, MD [address] [place] [address] Phone: [phone] Fax: [phone]   Return to Endocrinology clinic as below: Future Appointments  Date Time Provider Department Center  03/17/2020 12:00 PM [facility] 1 [facility]

## 2020-02-15 ENCOUNTER — Telehealth: Payer: Self-pay | Admitting: Family Medicine

## 2020-02-15 ENCOUNTER — Encounter: Payer: Self-pay | Admitting: Internal Medicine

## 2020-02-15 NOTE — Telephone Encounter (Signed)
Pt's son would like a cb concerning who you might recommend for her to see at pcp or transfer to your new clinic. Please advise at [phone].

## 2020-02-19 LAB — T3: T3, Total: 37 ng/dL — ABNORMAL LOW (ref 76–181)

## 2020-02-19 LAB — PROLACTIN: Prolactin: <1 ng/mL — ABNORMAL LOW

## 2020-02-19 LAB — INSULIN-LIKE GROWTH FACTOR
IGF-I, LC/MS: 261 ng/mL — ABNORMAL HIGH (ref 34–245)
Z-Score (Female): 2.1 SD — ABNORMAL HIGH (ref ?–2.0)

## 2020-02-19 LAB — ACTH: C206 ACTH: <5 pg/mL — ABNORMAL LOW (ref 6–50)

## 2020-02-19 NOTE — Telephone Encounter (Signed)
Spoke with pt's son and scheduled toc

## 2020-02-21 ENCOUNTER — Telehealth: Payer: Self-pay | Admitting: Internal Medicine

## 2020-02-21 NOTE — Telephone Encounter (Signed)
I placed a call to [name]' s son [name] , this was a limited conversation as he was getting ready to start a calls.    I explained to him that her growth hormone levels are elevated and will need further testing to confirm these results.   Pt will need 75- g oral glucose test with growth hormone check, we are awaiting on 75- gram glucose to obtain prior to proceeding with this, will update by next week    Son expressed understanding    [name], MD  [facility] Endocrinology  [facility] [address] [place], [place] [address] Phone: [phone] FAX: [phone]

## 2020-02-26 ENCOUNTER — Encounter: Payer: Self-pay | Admitting: Infectious Disease

## 2020-02-26 DIAGNOSIS — Z961 Presence of intraocular lens: Secondary | ICD-10-CM | POA: Diagnosis not present

## 2020-02-26 DIAGNOSIS — H179 Unspecified corneal scar and opacity: Secondary | ICD-10-CM | POA: Diagnosis not present

## 2020-02-26 DIAGNOSIS — H18421 Band keratopathy, right eye: Secondary | ICD-10-CM | POA: Diagnosis not present

## 2020-02-26 DIAGNOSIS — H30031 Focal chorioretinal inflammation, peripheral, right eye: Secondary | ICD-10-CM | POA: Diagnosis not present

## 2020-02-26 DIAGNOSIS — H35371 Puckering of macula, right eye: Secondary | ICD-10-CM | POA: Diagnosis not present

## 2020-02-26 LAB — HM DIABETES EYE EXAM

## 2020-03-11 ENCOUNTER — Telehealth: Payer: Self-pay | Admitting: Internal Medicine

## 2020-03-11 DIAGNOSIS — E349 Endocrine disorder, unspecified: Secondary | ICD-10-CM | POA: Insufficient documentation

## 2020-03-11 DIAGNOSIS — R7989 Other specified abnormal findings of blood chemistry: Secondary | ICD-10-CM | POA: Insufficient documentation

## 2020-03-11 NOTE — Telephone Encounter (Signed)
Lft vm for return call

## 2020-03-11 NOTE — Telephone Encounter (Signed)
PLease call son " Gregary Signs : and schedule the pt for a lab appointment for growth hormone test.   He is aware of such testing but I was waiting on the glucose drink.    Pt will need to have labs at baseline and then 2 hours later. She has to be fasting " except water"     Thanks    [name], MD  [facility] Endocrinology  [facility] [address] [place], [place] [address] Phone: [phone] FAX: [phone]

## 2020-03-12 NOTE — Telephone Encounter (Signed)
Pt already had labs scheduled for 6/18 @ 8:30

## 2020-03-12 NOTE — Telephone Encounter (Signed)
I did inform son of that also that pt needs to be fasting.

## 2020-03-14 ENCOUNTER — Other Ambulatory Visit: Payer: Self-pay | Admitting: Radiology

## 2020-03-17 ENCOUNTER — Ambulatory Visit (HOSPITAL_COMMUNITY): Admission: RE | Admit: 2020-03-17 | Payer: Medicare Other | Source: Ambulatory Visit

## 2020-03-28 ENCOUNTER — Other Ambulatory Visit: Payer: Self-pay

## 2020-03-28 ENCOUNTER — Other Ambulatory Visit (INDEPENDENT_AMBULATORY_CARE_PROVIDER_SITE_OTHER): Payer: Medicare Other

## 2020-03-28 DIAGNOSIS — E038 Other specified hypothyroidism: Secondary | ICD-10-CM

## 2020-03-28 DIAGNOSIS — E349 Endocrine disorder, unspecified: Secondary | ICD-10-CM

## 2020-03-28 LAB — TSH: TSH: <0.01 u[IU]/mL — ABNORMAL LOW (ref 0.35–4.50)

## 2020-03-28 LAB — T4, FREE: Free T4: 0.78 ng/dL (ref 0.60–1.60)

## 2020-03-31 ENCOUNTER — Encounter: Payer: Self-pay | Admitting: Internal Medicine

## 2020-03-31 LAB — GROWTH HORMONE
Growth Hormone: <0.1 ng/mL (ref <=7.1)
Growth Hormone: <0.1 ng/mL (ref <=7.1)

## 2020-04-07 ENCOUNTER — Telehealth: Payer: Self-pay | Admitting: Family Medicine

## 2020-04-07 NOTE — Telephone Encounter (Signed)
Patient's son is calling in asking for lab results.

## 2020-04-07 NOTE — Telephone Encounter (Signed)
Informed pt son that a letter was mailed out with results and went over what was in the letter.

## 2020-04-10 ENCOUNTER — Ambulatory Visit: Payer: Medicare Other | Admitting: Family Medicine

## 2020-04-22 DIAGNOSIS — H179 Unspecified corneal scar and opacity: Secondary | ICD-10-CM | POA: Diagnosis not present

## 2020-04-22 DIAGNOSIS — H35371 Puckering of macula, right eye: Secondary | ICD-10-CM | POA: Diagnosis not present

## 2020-04-22 DIAGNOSIS — Z961 Presence of intraocular lens: Secondary | ICD-10-CM | POA: Diagnosis not present

## 2020-04-22 DIAGNOSIS — H18421 Band keratopathy, right eye: Secondary | ICD-10-CM | POA: Diagnosis not present

## 2020-04-22 DIAGNOSIS — H30031 Focal chorioretinal inflammation, peripheral, right eye: Secondary | ICD-10-CM | POA: Diagnosis not present

## 2020-04-25 ENCOUNTER — Encounter: Payer: Medicare Other | Admitting: Family Medicine

## 2020-05-10 DIAGNOSIS — R4020 Unspecified coma: Secondary | ICD-10-CM | POA: Diagnosis not present

## 2020-05-10 DIAGNOSIS — I959 Hypotension, unspecified: Secondary | ICD-10-CM | POA: Diagnosis not present

## 2020-05-10 DIAGNOSIS — T679XXA Effect of heat and light, unspecified, initial encounter: Secondary | ICD-10-CM | POA: Diagnosis not present

## 2020-05-10 DIAGNOSIS — I10 Essential (primary) hypertension: Secondary | ICD-10-CM | POA: Diagnosis not present

## 2020-05-10 DIAGNOSIS — R55 Syncope and collapse: Secondary | ICD-10-CM | POA: Diagnosis not present

## 2020-05-16 ENCOUNTER — Encounter: Payer: Self-pay | Admitting: Internal Medicine

## 2020-05-16 ENCOUNTER — Other Ambulatory Visit: Payer: Self-pay

## 2020-05-16 ENCOUNTER — Ambulatory Visit (INDEPENDENT_AMBULATORY_CARE_PROVIDER_SITE_OTHER): Payer: Medicare Other | Admitting: Internal Medicine

## 2020-05-16 VITALS — BP 140/70 | HR 76 | Ht 64.0 in | Wt 126.2 lb

## 2020-05-16 DIAGNOSIS — E230 Hypopituitarism: Secondary | ICD-10-CM

## 2020-05-16 DIAGNOSIS — E038 Other specified hypothyroidism: Secondary | ICD-10-CM

## 2020-05-16 DIAGNOSIS — E2740 Unspecified adrenocortical insufficiency: Secondary | ICD-10-CM

## 2020-05-16 LAB — BASIC METABOLIC PANEL
BUN: 21 mg/dL (ref 6–23)
CO2: 32 mEq/L (ref 19–32)
Calcium: 9.5 mg/dL (ref 8.4–10.5)
Chloride: 106 mEq/L (ref 96–112)
Creatinine, Ser: 1.4 mg/dL — ABNORMAL HIGH (ref 0.40–1.20)
GFR: 44.31 mL/min — ABNORMAL LOW (ref >60.00)
Glucose, Bld: 83 mg/dL (ref 70–99)
Potassium: 4.4 mEq/L (ref 3.5–5.1)
Sodium: 143 mEq/L (ref 135–145)

## 2020-05-16 LAB — T4, FREE: Free T4: 0.77 ng/dL (ref 0.60–1.60)

## 2020-05-16 NOTE — Patient Instructions (Addendum)
-  Please OBTAIN a medical alert bracelet ( Hypopituitarism ) - Continue Levothyroxine 50 mcg daily  - Hydrocortisone 10 mg with Breakfast and 5 mg in the afternoon between 2-4 pm    ADRENAL INSUFFICIENCY SICK DAY RULES:  Should you face an extreme emotional or physical stress such as trauma, surgery or acute illness, this will require extra steroid coverage so that the body can meet that stress.   Without increasing the steroid dose you may experience severe weakness, headache, dizziness, nausea and vomiting and possibly a more serious deterioration in health.  Typically the dose of steroids will only need to be increased for a couple of days if you have an illness that is transient and managed in the community.   If you are unable to take/absorb an increased dose of steroids orally because of vomiting or diarrhea, you will urgently require steroid injections and should present to an Emergency Department.  The general advice for any serious illness is as follows: 1. Double the normal daily steroid dose for up to 3 days if you have a temperature of more than 37.50C (99.42F) with signs of sickness, or severe emotional or physical distress 2. Contact your primary care doctor and Endocrinologist if the illness worsens or it lasts for more than 3 days.  3. In cases of severe illness, urgent medical assistance should be promptly sought. 4. If you experience vomiting/diarrhea or are unable to take steroids by mouth, please administer the Hydrocortisone injection kit and seek urgent medical help.

## 2020-05-16 NOTE — Progress Notes (Signed)
Name: [name]  MRN/ DOB: [id number], 1946/04/20    Age/ Sex: 74 y.o., female     PCP: Doristine Bosworth, MD   Reason for Endocrinology Evaluation: Hypopituitarism      Initial Endocrinology Clinic Visit: 02/14/2020    PATIENT IDENTIFIER: [name] is a 74 y.o., female with a past medical history of deafness and hypopituitarism.  She has followed with [facility] since 02/14/2020 for consultative assistance with management of her hypopituitarism    HISTORICAL SUMMARY:   In review of her records she has been diagnosed with hypothyroidism > 20 yrs ago, she was on LT-4 replacement until 06/2019 when this was discontinued due to persistently low TSH.  Pt noted thyromegaly while off levothyroxine and had restarted taking her old prescription three times a week which has helped with thyromegaly.    She has been diagnosed with adrenal insufficiency sometime between 2005 and 2019 as this appeared in her chart in 2019 when she was admitted for a fall . She is on  HC 20 mg tablets , taking half a tablet once  daily   She has no head injury or prior exposure to radiation but was admitted in 2005 with septic shock after being admitted for AMS and fever, requiring intubation    Pt was restarted on LT-4 replacement in 02/2020 with a FT4 of 0.35 ng/dL , prolactin was undetectable at < 0.1 ng/mL, as well as an ACTH < 5 pg/mL,  with inappropriately normal  LH and FSH.   IGF-a level was elevated at 261 ng/mL but acromegaly was ruled out with undetectable growth hormone following a 75 g- oral glucose tolerance test  Hydrocortisone has been adjusted    Son  with thyroid disease  SUBJECTIVE:     Today (05/16/2020):  [name] is here for a follow up on panhypopituitarism . She is accompanied by her son Gregary Signs today who provided all the history  Weight had been stable  Denies fever   Had an episode of feeling hot , dizzy and weak ambulance called. Was given fluids,  pt recovered well. Son attributes this to air condition issue   Home Endocrine Medications: Levothyroxine 50 mcg daily  HC 10 mg QAM and 5 mg PM       ROS:  As per HPI.   HISTORY:  Past Medical History:  Past Medical History:  Diagnosis Date  . Adrenal insufficiency (Addison's disease) (HCC)   . 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 Hypertension   . Syphilis 01/28/2020  . Thyroid disease    hypothyroidism  . Type 2 diabetes mellitus (HCC)    Past Surgical History:  Past Surgical History:  Procedure Laterality Date  . EYE SURGERY    . FEMUR IM NAIL Left 02/02/2018  . FEMUR IM NAIL Left 02/02/2018   Procedure: INTRAMEDULLARY (IM) NAIL LEFT FEMUR;  Surgeon: Roby Lofts, MD;  Location: [facility] OR;  Service: Orthopedics;  Laterality: Left;    Social History:  reports that she has quit smoking. She has never used smokeless tobacco. She reports that she does not drink alcohol and does not use drugs. Family History:  Family History  Problem Relation Age of Onset  . Vitiligo Son      HOME MEDICATIONS: Allergies as of 05/16/2020   No Known Allergies     Medication List       Accurate as of May 16, 2020  8:23 AM. If you have any questions, ask your nurse  or doctor.        Centrum Silver 50+Women Tabs Take 1 tablet by mouth daily.   escitalopram 10 MG tablet Commonly known as: Lexapro Take 1 tablet (10 mg total) by mouth daily.   hydrochlorothiazide 25 MG tablet Commonly known as: HYDRODIURIL Take 1 tablet (25 mg total) by mouth daily.   hydrocortisone 10 MG tablet Commonly known as: CORTEF Take 1 tablet (10 mg total) by mouth in the morning AND 0.5 tablets (5 mg total) daily in the afternoon.   levothyroxine 50 MCG tablet Commonly known as: SYNTHROID Take 1 tablet (50 mcg total) by mouth daily.   polyethylene glycol 17 g packet Commonly known as: MIRALAX / GLYCOLAX Take 17 g by mouth 2 (two) times daily. (until having soft bowel movement  daily, then use as needed or daily)   prednisoLONE acetate 1 % ophthalmic suspension Commonly known as: PRED FORTE INSTILL 1 DROP INTO EACH EYE TWICE DAILY   sodium chloride 2 % ophthalmic solution Commonly known as: MURO 128 Place 1 drop into both eyes 2 (two) times daily.   SYSTANE BALANCE OP Apply 2 drops to eye 3 (three) times daily as needed (dry eyes).         OBJECTIVE:   PHYSICAL EXAM: VS: BP 140/70   Pulse 76   Ht 5\' 4"  (1.626 m)   Wt 126 lb 3.2 oz (57.2 kg)   SpO2 99%   BMI 21.66 kg/m    EXAM: General: Pt appears well and is in NAD  Neck: General: Supple without adenopathy. Thyroid: Thyroid size normal.  No goiter or nodules appreciated.   Lungs: Clear with good BS bilat with no rales, rhonchi, or wheezes  Heart: Auscultation: RRR.  Abdomen: Normoactive bowel sounds, soft, nontender, without masses or organomegaly palpable  Extremities:  BL LE: No pretibial edema normal ROM and strength.     DATA REVIEWED: Results for TAKELIA, URIETA (MRN Norval Morton) as of 05/17/2020 08:08  Ref. 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 No prior hx of head trauma or radiation.  - She had a CT scan of the brain in 03/2019 with no mention of pituitary pathology.  Will consider a  dedicated pituitary MRI in the future  - Her FSH, LH , ACTH  And prolactin are abnormally low  - She initially had elevated IGF-1 level but growth hormone was undetectable with oral glucose tolerance test     2. Secondary Hypothyroidism:   - Pt is clinically euthyroid  - TSH should NOT  be used in making any LT- 4 replacement adjustments due to hypopituitarism - Will increase LT- 4 replacement as below ,  to get to a goal of FT4 of ~ 1 ng/dL     Medications : Stop Levothyroxine 50 mcg  Start Levothyroxine 75 mcg daily     3. Secondary Adrenal Insufficiency :   - I have explained to the son the importance of HC replacement, when to take it , we also discussed the sick day rules and that insufficient intake of hydrocortisone could be fatal to the pt  - I have asked him to obtain a medical alert bracelet    Medications:  Hydrocortisone 10 mg with breakfast and 5 mg between 2-4 pm    F/U in 6 months      Signed electronically by: Lyndle Herrlich, MD  [facility]  [facility] [address] [place] [place], [place] [address] Phone: [phone] FAX: [phone]      CC: Doristine Bosworth, MD [phone] [address] [place] [address] [place] [address] Phone: [phone]  Fax: [phone]   Return to Endocrinology clinic as below: No future appointments.

## 2020-05-17 ENCOUNTER — Telehealth: Payer: Self-pay | Admitting: Internal Medicine

## 2020-05-17 MED ORDER — LEVOTHYROXINE SODIUM 75 MCG PO TABS
75.0000 ug | ORAL_TABLET | Freq: Every day | ORAL | 3 refills | Status: AC
Start: 2020-05-17 — End: ?

## 2020-05-17 NOTE — Telephone Encounter (Signed)
Can you please let the son Gregary Signs) know that I will increase Mother's levothyroxine from 50 to 75 mcg. She can start that one after she finishes her current bottle.    Her kidney function is stable and her electrolytes are normal.     Thanks  [name], MD  [facility]  [facility] [address] [place], [place] [address] Phone: [phone] FAX: [phone]

## 2020-05-19 NOTE — Telephone Encounter (Signed)
Called pt's son Gregary Signs, and left detailed voicemail with all of MD's information and message. Requested that pt's son return phone call if he has any questions at all.

## 2020-07-01 DIAGNOSIS — H30031 Focal chorioretinal inflammation, peripheral, right eye: Secondary | ICD-10-CM | POA: Diagnosis not present

## 2020-07-01 DIAGNOSIS — H179 Unspecified corneal scar and opacity: Secondary | ICD-10-CM | POA: Diagnosis not present

## 2020-07-01 DIAGNOSIS — H18421 Band keratopathy, right eye: Secondary | ICD-10-CM | POA: Diagnosis not present

## 2020-07-01 DIAGNOSIS — Z961 Presence of intraocular lens: Secondary | ICD-10-CM | POA: Diagnosis not present

## 2020-07-01 DIAGNOSIS — H35371 Puckering of macula, right eye: Secondary | ICD-10-CM | POA: Diagnosis not present

## 2020-09-09 DIAGNOSIS — Z961 Presence of intraocular lens: Secondary | ICD-10-CM | POA: Diagnosis not present

## 2020-09-09 DIAGNOSIS — H30031 Focal chorioretinal inflammation, peripheral, right eye: Secondary | ICD-10-CM | POA: Diagnosis not present

## 2020-09-09 DIAGNOSIS — H179 Unspecified corneal scar and opacity: Secondary | ICD-10-CM | POA: Diagnosis not present

## 2020-09-09 DIAGNOSIS — H35371 Puckering of macula, right eye: Secondary | ICD-10-CM | POA: Diagnosis not present

## 2020-09-09 DIAGNOSIS — H18421 Band keratopathy, right eye: Secondary | ICD-10-CM | POA: Diagnosis not present

## 2020-09-17 DIAGNOSIS — H268 Other specified cataract: Secondary | ICD-10-CM | POA: Diagnosis not present

## 2020-09-17 DIAGNOSIS — Z961 Presence of intraocular lens: Secondary | ICD-10-CM | POA: Diagnosis not present

## 2020-09-17 DIAGNOSIS — H35371 Puckering of macula, right eye: Secondary | ICD-10-CM | POA: Diagnosis not present

## 2020-09-17 DIAGNOSIS — H179 Unspecified corneal scar and opacity: Secondary | ICD-10-CM | POA: Diagnosis not present

## 2020-11-21 ENCOUNTER — Other Ambulatory Visit: Payer: Self-pay

## 2020-11-21 ENCOUNTER — Encounter: Payer: Self-pay | Admitting: Internal Medicine

## 2020-11-21 ENCOUNTER — Ambulatory Visit (INDEPENDENT_AMBULATORY_CARE_PROVIDER_SITE_OTHER): Payer: Medicare Other | Admitting: Internal Medicine

## 2020-11-21 VITALS — BP 124/76 | HR 80 | Ht 64.0 in | Wt 141.0 lb

## 2020-11-21 DIAGNOSIS — E2740 Unspecified adrenocortical insufficiency: Secondary | ICD-10-CM | POA: Diagnosis not present

## 2020-11-21 DIAGNOSIS — E230 Hypopituitarism: Secondary | ICD-10-CM

## 2020-11-21 DIAGNOSIS — E038 Other specified hypothyroidism: Secondary | ICD-10-CM | POA: Diagnosis not present

## 2020-11-21 LAB — BASIC METABOLIC PANEL
BUN: 18 mg/dL (ref 6–23)
CO2: 33 mEq/L — ABNORMAL HIGH (ref 19–32)
Calcium: 9.5 mg/dL (ref 8.4–10.5)
Chloride: 105 mEq/L (ref 96–112)
Creatinine, Ser: 1.13 mg/dL (ref 0.40–1.20)
GFR: 47.46 mL/min — ABNORMAL LOW (ref >60.00)
Glucose, Bld: 99 mg/dL (ref 70–99)
Potassium: 4.5 mEq/L (ref 3.5–5.1)
Sodium: 143 mEq/L (ref 135–145)

## 2020-11-21 LAB — T4, FREE: Free T4: 1.15 ng/dL (ref 0.60–1.60)

## 2020-11-21 NOTE — Progress Notes (Signed)
Name: Amaziah Ghosh  MRN/ DOB: [id number], 14-May-1946    Age/ Sex: 75 y.o., female     PCP: Doristine Bosworth, MD   Reason for Endocrinology Evaluation: Hypopituitarism      Initial Endocrinology Clinic Visit: 02/14/2020    PATIENT IDENTIFIER: [name] is a 75 y.o., female with a past medical history of deafness and hypopituitarism.  She has followed with [facility] since 02/14/2020 for consultative assistance with management of her hypopituitarism    HISTORICAL SUMMARY:   In review of her records she has been diagnosed with hypothyroidism > 20 yrs ago, she was on LT-4 replacement until 06/2019 when this was discontinued due to persistently low TSH.  Pt noted thyromegaly while off levothyroxine and had restarted taking her old prescription three times a week which has helped with thyromegaly.    She has been diagnosed with adrenal insufficiency sometime between 2005 and 2019 as this appeared in her chart in 2019 when she was admitted for a fall . She is on  HC 20 mg tablets , taking half a tablet once  daily   She has no head injury or prior exposure to radiation but was admitted in 2005 with septic shock after being admitted for AMS and fever, requiring intubation    Pt was restarted on LT-4 replacement in 02/2020 with a FT4 of 0.35 ng/dL , prolactin was undetectable at < 0.1 ng/mL, as well as an ACTH < 5 pg/mL,  with inappropriately normal  LH and FSH.   IGF-a level was elevated at 261 ng/mL but acromegaly was ruled out with undetectable growth hormone following a 75 g- oral glucose tolerance test  Hydrocortisone has been adjusted    Son  with thyroid disease  SUBJECTIVE:     Today (11/21/2020):  Ms. Alford is here for a follow up on panhypopituitarism . Pt with visual and hearing impairment. She is accompanied by her son Eliott Nine  today who provided all the history  Weight had been stable  Denies fever   She sleeps a lot but nothing out of the  ordinary  NO local neck symptoms     Home Endocrine Medications: Levothyroxine 75 mcg daily  HC 10 mg QAM and 5 mg PM      HISTORY:  Past Medical History:  Past Medical History:  Diagnosis Date  . Adrenal insufficiency (Addison's disease) (HCC)   . 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 Hypertension   . Syphilis 01/28/2020  . Thyroid disease    hypothyroidism  . Type 2 diabetes mellitus (HCC)    Past Surgical History:  Past Surgical History:  Procedure Laterality Date  . EYE SURGERY    . FEMUR IM NAIL Left 02/02/2018  . FEMUR IM NAIL Left 02/02/2018   Procedure: INTRAMEDULLARY (IM) NAIL LEFT FEMUR;  Surgeon: Roby Lofts, MD;  Location: [facility] OR;  Service: Orthopedics;  Laterality: Left;    Social History:  reports that she has quit smoking. She has never used smokeless tobacco. She reports that she does not drink alcohol and does not use drugs. Family History:  Family History  Problem Relation Age of Onset  . Vitiligo Son      HOME MEDICATIONS: Allergies as of 11/21/2020   No Known Allergies     Medication List       Accurate as of November 21, 2020  7:18 AM. If you have any questions, ask your nurse or doctor.  Centrum Silver 50+Women Tabs Take 1 tablet by mouth daily.   escitalopram 10 MG tablet Commonly known as: Lexapro Take 1 tablet (10 mg total) by mouth daily.   hydrochlorothiazide 25 MG tablet Commonly known as: HYDRODIURIL Take 1 tablet (25 mg total) by mouth daily.   hydrocortisone 10 MG tablet Commonly known as: CORTEF Take 1 tablet (10 mg total) by mouth in the morning AND 0.5 tablets (5 mg total) daily in the afternoon.   levothyroxine 75 MCG tablet Commonly known as: SYNTHROID Take 1 tablet (75 mcg total) by mouth daily.   polyethylene glycol 17 g packet Commonly known as: MIRALAX / GLYCOLAX Take 17 g by mouth 2 (two) times daily. (until having soft bowel movement daily, then use as needed or daily)    prednisoLONE acetate 1 % ophthalmic suspension Commonly known as: PRED FORTE INSTILL 1 DROP INTO EACH EYE TWICE DAILY   sodium chloride 2 % ophthalmic solution Commonly known as: MURO 128 Place 1 drop into both eyes 2 (two) times daily.   SYSTANE BALANCE OP Apply 2 drops to eye 3 (three) times daily as needed (dry eyes).         OBJECTIVE:   PHYSICAL EXAM: VS:BP 124/76   Pulse 80   Ht 5\' 4"  (1.626 m)   Wt 141 lb (64 kg)   SpO2 98%   BMI 24.20 kg/m   EXAM: General: Pt appears well and is in NAD  Neck: General: Supple without adenopathy. Thyroid: Thyroid size normal.  No goiter or nodules appreciated.   Lungs: Clear with good BS bilat with no rales, rhonchi, or wheezes  Heart: Auscultation: RRR.  Abdomen: Normoactive bowel sounds, soft, nontender, without masses or organomegaly palpable  Extremities:  BL LE: No pretibial edema normal ROM and strength.     DATA REVIEWED:  Results for NYLEE, BARBUTO (MRN Norval Morton) as of 11/24/2020 09:02  Ref. Range 11/21/2020 10:43  Sodium Latest Ref Range: 135 - 145 mEq/L 143  Potassium Latest Ref Range: 3.5 - 5.1 mEq/L 4.5  Chloride Latest Ref Range: 96 - 112 mEq/L 105  CO2 Latest Ref Range: 19 - 32 mEq/L 33 (H)  Glucose Latest Ref Range: 70 - 99 mg/dL 99  BUN Latest Ref Range: 6 - 23 mg/dL 18  Creatinine Latest Ref Range: 0.40 - 1.20 mg/dL 01/19/2021  Calcium Latest Ref Range: 8.4 - 10.5 mg/dL 9.5  GFR Latest Ref Range: >60.00 mL/min 47.46 (L)  T4,Free(Direct) Latest Ref Range: 0.60 - 1.60 ng/dL 9.93     ASSESSMENT / PLAN / RECOMMENDATIONS:   1. 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 No prior hx of head trauma or radiation.  - She had a CT scan of the brain in 03/2019 with no mention of pituitary pathology - Her FSH, LH , ACTH  And prolactin are abnormally low  - She initially had elevated IGF-1 level but  growth hormone was undetectable with oral glucose tolerance test     2. Secondary Hypothyroidism:   - Pt is clinically euthyroid  - TSH should NOT  be used in making any LT- 4 replacement adjustments due to hypopituitarism - Will continue LT- 4 replacement as below  - FT4 level is normal today   Medications : Continue  Levothyroxine 75 mcg daily     3. Secondary Adrenal Insufficiency :   - I have explained  to the son the importance of HC replacement, when to take it , we also discussed the sick day rules and that insufficient intake of hydrocortisone could be fatal to the pt  - I have asked him to obtain a medical alert bracelet  - BMP normal with stable low GFR  Medications:  Hydrocortisone 10 mg with breakfast and 5 mg between 2-4 pm       F/U in 6 months      Signed electronically by: Lyndle Herrlich, MD  [facility] Endocrinology  [facility] [address] [place] [place], [place] [address] Phone: [phone] FAX: [phone]      CC: Doristine Bosworth, MD [phone] [address] [place] [address] Phone: [phone]  Fax: [phone]   Return to Endocrinology clinic as below: Future Appointments  Date Time Provider Department Center  11/21/2020 10:10 AM Michelle Vanhise, Konrad Dolores, MD [id number] None

## 2020-11-21 NOTE — Patient Instructions (Addendum)
-  Please OBTAIN a medical alert bracelet ( Hypopituitarism ) - Continue Levothyroxine 75 mcg daily  - Hydrocortisone 10 mg, ONE tablet  with Breakfast and Half a tablet (5 mg)  in the afternoon between 2-4 pm    ADRENAL INSUFFICIENCY SICK DAY RULES:  Should you face an extreme emotional or physical stress such as trauma, surgery or acute illness, this will require extra steroid coverage so that the body can meet that stress.   Without increasing the steroid dose you may experience severe weakness, headache, dizziness, nausea and vomiting and possibly a more serious deterioration in health.  Typically the dose of steroids will only need to be increased for a couple of days if you have an illness that is transient and managed in the community.   If you are unable to take/absorb an increased dose of steroids orally because of vomiting or diarrhea, you will urgently require steroid injections and should present to an Emergency Department.  The general advice for any serious illness is as follows: 1. Double the normal daily steroid dose for up to 3 days if you have a temperature of more than 37.50C (99.47F) with signs of sickness, or severe emotional or physical distress 2. Contact your primary care doctor and Endocrinologist if the illness worsens or it lasts for more than 3 days.  3. In cases of severe illness, urgent medical assistance should be promptly sought. 4. If you experience vomiting/diarrhea or are unable to take steroids by mouth, please administer the Hydrocortisone injection kit and seek urgent medical help.

## 2020-11-24 ENCOUNTER — Encounter: Payer: Self-pay | Admitting: Internal Medicine

## 2021-01-15 ENCOUNTER — Encounter (HOSPITAL_COMMUNITY): Payer: Self-pay

## 2021-01-15 ENCOUNTER — Emergency Department (HOSPITAL_COMMUNITY): Payer: Medicare Other

## 2021-01-15 ENCOUNTER — Other Ambulatory Visit: Payer: Self-pay

## 2021-01-15 ENCOUNTER — Inpatient Hospital Stay (HOSPITAL_COMMUNITY)
Admission: EM | Admit: 2021-01-15 | Discharge: 2021-01-17 | DRG: 064 | Disposition: A | Discharge status: Home Health | Payer: Medicare Other | Attending: Internal Medicine | Admitting: Internal Medicine

## 2021-01-15 DIAGNOSIS — I10 Essential (primary) hypertension: Secondary | ICD-10-CM | POA: Diagnosis present

## 2021-01-15 DIAGNOSIS — E785 Hyperlipidemia, unspecified: Secondary | ICD-10-CM | POA: Diagnosis present

## 2021-01-15 DIAGNOSIS — E119 Type 2 diabetes mellitus without complications: Secondary | ICD-10-CM

## 2021-01-15 DIAGNOSIS — R4781 Slurred speech: Secondary | ICD-10-CM | POA: Diagnosis present

## 2021-01-15 DIAGNOSIS — Z79890 Hormone replacement therapy (postmenopausal): Secondary | ICD-10-CM

## 2021-01-15 DIAGNOSIS — G9341 Metabolic encephalopathy: Secondary | ICD-10-CM | POA: Diagnosis present

## 2021-01-15 DIAGNOSIS — H9190 Unspecified hearing loss, unspecified ear: Secondary | ICD-10-CM | POA: Diagnosis present

## 2021-01-15 DIAGNOSIS — R4182 Altered mental status, unspecified: Secondary | ICD-10-CM

## 2021-01-15 DIAGNOSIS — H548 Legal blindness, as defined in USA: Secondary | ICD-10-CM | POA: Diagnosis present

## 2021-01-15 DIAGNOSIS — E2740 Unspecified adrenocortical insufficiency: Secondary | ICD-10-CM | POA: Diagnosis present

## 2021-01-15 DIAGNOSIS — K5900 Constipation, unspecified: Secondary | ICD-10-CM | POA: Diagnosis present

## 2021-01-15 DIAGNOSIS — E230 Hypopituitarism: Secondary | ICD-10-CM | POA: Diagnosis present

## 2021-01-15 DIAGNOSIS — D696 Thrombocytopenia, unspecified: Secondary | ICD-10-CM | POA: Diagnosis present

## 2021-01-15 DIAGNOSIS — E039 Hypothyroidism, unspecified: Secondary | ICD-10-CM | POA: Diagnosis present

## 2021-01-15 DIAGNOSIS — E271 Primary adrenocortical insufficiency: Secondary | ICD-10-CM | POA: Diagnosis present

## 2021-01-15 DIAGNOSIS — Z87891 Personal history of nicotine dependence: Secondary | ICD-10-CM

## 2021-01-15 DIAGNOSIS — E11649 Type 2 diabetes mellitus with hypoglycemia without coma: Secondary | ICD-10-CM | POA: Diagnosis not present

## 2021-01-15 DIAGNOSIS — R29810 Facial weakness: Secondary | ICD-10-CM | POA: Diagnosis present

## 2021-01-15 DIAGNOSIS — I639 Cerebral infarction, unspecified: Principal | ICD-10-CM | POA: Diagnosis present

## 2021-01-15 DIAGNOSIS — Z79899 Other long term (current) drug therapy: Secondary | ICD-10-CM

## 2021-01-15 DIAGNOSIS — R52 Pain, unspecified: Secondary | ICD-10-CM

## 2021-01-15 DIAGNOSIS — I6330 Cerebral infarction due to thrombosis of unspecified cerebral artery: Secondary | ICD-10-CM | POA: Insufficient documentation

## 2021-01-15 DIAGNOSIS — A523 Neurosyphilis, unspecified: Secondary | ICD-10-CM | POA: Diagnosis present

## 2021-01-15 DIAGNOSIS — Z20822 Contact with and (suspected) exposure to covid-19: Secondary | ICD-10-CM | POA: Diagnosis present

## 2021-01-15 DIAGNOSIS — R29711 NIHSS score 11: Secondary | ICD-10-CM | POA: Diagnosis present

## 2021-01-15 LAB — COMPREHENSIVE METABOLIC PANEL
ALT: 14 U/L (ref 0–44)
AST: 31 U/L (ref 15–41)
Albumin: 3.5 g/dL (ref 3.5–5.0)
Alkaline Phosphatase: 58 U/L (ref 38–126)
Anion gap: 9 (ref 5–15)
BUN: 20 mg/dL (ref 8–23)
CO2: 25 mmol/L (ref 22–32)
Calcium: 9.5 mg/dL (ref 8.9–10.3)
Chloride: 107 mmol/L (ref 98–111)
Creatinine, Ser: 1.12 mg/dL — ABNORMAL HIGH (ref 0.44–1.00)
GFR, Estimated: 51 mL/min — ABNORMAL LOW (ref >60)
Glucose, Bld: 80 mg/dL (ref 70–99)
Potassium: 4.1 mmol/L (ref 3.5–5.1)
Sodium: 141 mmol/L (ref 135–145)
Total Bilirubin: 0.6 mg/dL (ref 0.3–1.2)
Total Protein: 6.1 g/dL — ABNORMAL LOW (ref 6.5–8.1)

## 2021-01-15 LAB — CBC WITH DIFFERENTIAL/PLATELET
Abs Immature Granulocytes: 0.04 10*3/uL (ref 0.00–0.07)
Basophils Absolute: 0 10*3/uL (ref 0.0–0.1)
Basophils Relative: 1 %
Eosinophils Absolute: 0.1 10*3/uL (ref 0.0–0.5)
Eosinophils Relative: 2 %
HCT: 38.7 % (ref 36.0–46.0)
Hemoglobin: 12.3 g/dL (ref 12.0–15.0)
Immature Granulocytes: 1 %
Lymphocytes Relative: 30 %
Lymphs Abs: 2.3 10*3/uL (ref 0.7–4.0)
MCH: 25.6 pg — ABNORMAL LOW (ref 26.0–34.0)
MCHC: 31.8 g/dL (ref 30.0–36.0)
MCV: 80.6 fL (ref 80.0–100.0)
Monocytes Absolute: 0.5 10*3/uL (ref 0.1–1.0)
Monocytes Relative: 7 %
Neutro Abs: 4.6 10*3/uL (ref 1.7–7.7)
Neutrophils Relative %: 59 %
Platelets: 130 10*3/uL — ABNORMAL LOW (ref 150–400)
RBC: 4.8 MIL/uL (ref 3.87–5.11)
RDW: 13.8 % (ref 11.5–15.5)
WBC: 7.6 10*3/uL (ref 4.0–10.5)
nRBC: 0 % (ref 0.0–0.2)

## 2021-01-15 MED ORDER — SODIUM CHLORIDE 0.9 % IV BOLUS
500.0000 mL | Freq: Once | INTRAVENOUS | Status: AC
  Administered 2021-01-16: 500 mL via INTRAVENOUS

## 2021-01-15 NOTE — ED Notes (Signed)
[facility] called  

## 2021-01-15 NOTE — ED Triage Notes (Signed)
BIB [facility] from home. Lives alone. Deaf and visual impaired. Family states that the pt was altered when someone came to check on her at 9pm tonight. When E[facility] arrived pt was sitting on the toilet. Pt has not been speaking with EMS. LKW last night. Son is on the way.   205/74 60 heart rate  98% room air 117 CBG

## 2021-01-15 NOTE — ED Provider Notes (Signed)
[facility] EMERGENCY DEPARTMENT Provider Note   CSN: [id number] Arrival date & time: 01/15/21  2218     History Chief Complaint  Patient presents with  . Altered Mental Status    [name] is a 75 y.o. female.  Patient with history of hypopituitarism, deaf, visual impairment presents with her son who states she was found at home tonight with altered mental status. She lives alone and is capable of all ADL's. Her son talks to her by captioned phone call and has a camera in the home to check on her. There was no phone contact tonight as usual and he did not see her by using the cameras so family went to check on her. They found her in the bathroom, sitting on the toilet. She told her brother she had fallen earlier in the day. He found the water running in the bathroom, uncertain for how long. He looked for any sign of fall in the home or injury on the patient and found none. Brother also reports that last night when he went to see her, he felt her speech was slurred. No evidence of vomiting. She has not been coughing. Her brother reports that on his arrival she was very talkative. EMS reports that since their arrival, she has not been speaking. Unknown whether she has eaten today.  The history is provided by the patient. No language interpreter was used.       Past Medical History:  Diagnosis Date  . Adrenal insufficiency (Addison's disease) (HCC)   . 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 Hypertension   . Syphilis 01/28/2020  . Thyroid disease    hypothyroidism  . Type 2 diabetes mellitus [facility])     Patient Active Problem List   Diagnosis Date Noted  . Elevated growth hormone level 03/11/2020  . 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 Syphilis 01/28/2020  . 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 Anemia 02/02/2018  . Thrombocytopenia (HCC) 02/02/2018  . Hypokalemia 02/02/2018  .  Hypomagnesemia 02/02/2018  . Femur fracture, left (HCC) 02/02/2018  . Adrenal insufficiency (HCC) 02/02/2018  . Hypothyroidism 02/02/2018  . Type II diabetes mellitus (HCC) 02/02/2018  . Elevated troponin 02/02/2018    Past Surgical History:  Procedure Laterality Date  . EYE SURGERY    . FEMUR IM NAIL Left 02/02/2018  . FEMUR IM NAIL Left 02/02/2018   Procedure: INTRAMEDULLARY (IM) NAIL LEFT FEMUR;  Surgeon: Roby Lofts, MD;  Location: [facility] OR;  Service: Orthopedics;  Laterality: Left;     OB History   No obstetric history on file.     Family History  Problem Relation Age of Onset  . Vitiligo Son     Social History   Tobacco Use  . Smoking status: Former Games developer  . Smokeless tobacco: Never Used  Vaping Use  . Vaping Use: Never used  Substance Use Topics  . Alcohol use: Never  . Drug use: Never    Home Medications Prior to Admission medications   Medication Sig Start Date End Date Taking? Authorizing Provider  escitalopram (LEXAPRO) 10 MG tablet Take 1 tablet (10 mg total) by mouth daily. 07/04/19   Doristine Bosworth, MD  hydrochlorothiazide (HYDRODIURIL) 25 MG tablet Take 1 tablet (25 mg total) by mouth daily. 07/04/19   Doristine Bosworth, MD  hydrocortisone (CORTEF) 10 MG tablet Take 1 tablet (10 mg total) by mouth in the morning AND 0.5 tablets (5 mg total) daily  in the afternoon. 02/14/20   Shamleffer, Konrad Dolores, MD  levothyroxine (SYNTHROID) 75 MCG tablet Take 1 tablet (75 mcg total) by mouth daily. 05/17/20   Shamleffer, Konrad Dolores, MD  Multiple Vitamins-Minerals (CENTRUM SILVER 50+WOMEN) TABS Take 1 tablet by mouth daily.    [provider]  polyethylene glycol (MIRALAX / GLYCOLAX) packet Take 17 g by mouth 2 (two) times daily. (until having soft bowel movement daily, then use as needed or daily) 02/06/18   Zigmund Daniel., MD  prednisoLONE acetate (PRED FORTE) 1 % ophthalmic suspension INSTILL 1 DROP INTO EACH EYE TWICE DAILY 08/03/19    Doristine Bosworth, MD  Propylene Glycol (SYSTANE BALANCE OP) Apply 2 drops to eye 3 (three) times daily as needed (dry eyes).     [provider]  sodium chloride (MURO 128) 2 % ophthalmic solution Place 1 drop into both eyes 2 (two) times daily. 04/18/18   Doristine Bosworth, MD    Allergies    Patient has no known allergies.  Review of Systems   Review of Systems  Unable to perform ROS: Mental status change    Physical Exam Updated Vital Signs Temp 99 F (37.2 C) (Oral)   Ht 5' (1.524 m)   Wt 59 kg   BMI 25.39 kg/m   Physical Exam Vitals and nursing note reviewed.     ED Results / Procedures / Treatments   Labs (all labs ordered are listed, but only abnormal results are displayed) Labs Reviewed - No data to display  EKG None  Radiology No results found.  Procedures Procedures   Medications Ordered in ED Medications - No data to display  ED Course  I have reviewed the triage vital signs and the nursing notes.  Pertinent labs & imaging results that were available during my care of the patient were reviewed by me and considered in my medical decision making (see chart for details).    MDM Rules/Calculators/A&P                          Patient to ED with her son for evaluation of altered mental status. She is deaf, visually impaired but lives alone and able to care for herself with family close by. Family found her tonight confused. She was sitting on the toilet, there was running water, patient reporting fall without apparent injury. No evidence illness.   Her son is at bedside. He reports similar episodes in the past without finding on full evaluation. Symptoms resolved in 24-48 hours and patient returned to independent living.   VSS stable in the ED, although blood pressure has been elevated with no history of hypertension. Labs are unremarkable. Head and neck CT negative.   The patient became agitated while in the room, attempting to get out of bed  despite son's reassurance. Ativan provided and she is quiet and sleeping on recheck.   No cause for her changed mental status is identified. She will need admission for observation for improvement to return to her home where she lives alone. Hospitalist paged.   Blood pressure is noted to be elevated - no h/o HTN. Will monitor.  Final Clinical Impression(s) / ED Diagnoses Final diagnoses:  None   1. Altered mental status 2. Hypertension  Rx / DC Orders ED Discharge Orders    None       Danne Harbor 01/16/21 0238    Pollyann Savoy, MD 01/16/21 [phone]

## 2021-01-16 ENCOUNTER — Observation Stay (HOSPITAL_COMMUNITY): Payer: Medicare Other

## 2021-01-16 ENCOUNTER — Emergency Department (HOSPITAL_COMMUNITY): Payer: Medicare Other

## 2021-01-16 ENCOUNTER — Encounter (HOSPITAL_COMMUNITY): Payer: Self-pay | Admitting: Internal Medicine

## 2021-01-16 ENCOUNTER — Inpatient Hospital Stay (HOSPITAL_COMMUNITY): Payer: Medicare Other

## 2021-01-16 DIAGNOSIS — I10 Essential (primary) hypertension: Secondary | ICD-10-CM | POA: Diagnosis not present

## 2021-01-16 DIAGNOSIS — G9341 Metabolic encephalopathy: Secondary | ICD-10-CM

## 2021-01-16 DIAGNOSIS — I6522 Occlusion and stenosis of left carotid artery: Secondary | ICD-10-CM

## 2021-01-16 DIAGNOSIS — I6389 Other cerebral infarction: Secondary | ICD-10-CM

## 2021-01-16 DIAGNOSIS — Z20822 Contact with and (suspected) exposure to covid-19: Secondary | ICD-10-CM | POA: Diagnosis present

## 2021-01-16 DIAGNOSIS — I639 Cerebral infarction, unspecified: Principal | ICD-10-CM

## 2021-01-16 DIAGNOSIS — R29711 NIHSS score 11: Secondary | ICD-10-CM | POA: Diagnosis present

## 2021-01-16 DIAGNOSIS — R4182 Altered mental status, unspecified: Secondary | ICD-10-CM | POA: Diagnosis present

## 2021-01-16 DIAGNOSIS — H9190 Unspecified hearing loss, unspecified ear: Secondary | ICD-10-CM | POA: Diagnosis present

## 2021-01-16 DIAGNOSIS — E11649 Type 2 diabetes mellitus with hypoglycemia without coma: Secondary | ICD-10-CM | POA: Diagnosis not present

## 2021-01-16 DIAGNOSIS — K5900 Constipation, unspecified: Secondary | ICD-10-CM | POA: Diagnosis present

## 2021-01-16 DIAGNOSIS — Z79899 Other long term (current) drug therapy: Secondary | ICD-10-CM | POA: Diagnosis not present

## 2021-01-16 DIAGNOSIS — D696 Thrombocytopenia, unspecified: Secondary | ICD-10-CM | POA: Diagnosis present

## 2021-01-16 DIAGNOSIS — R4781 Slurred speech: Secondary | ICD-10-CM | POA: Diagnosis present

## 2021-01-16 DIAGNOSIS — A523 Neurosyphilis, unspecified: Secondary | ICD-10-CM | POA: Diagnosis present

## 2021-01-16 DIAGNOSIS — E039 Hypothyroidism, unspecified: Secondary | ICD-10-CM | POA: Diagnosis present

## 2021-01-16 DIAGNOSIS — R29810 Facial weakness: Secondary | ICD-10-CM | POA: Diagnosis present

## 2021-01-16 DIAGNOSIS — H548 Legal blindness, as defined in USA: Secondary | ICD-10-CM | POA: Diagnosis present

## 2021-01-16 DIAGNOSIS — Z79890 Hormone replacement therapy (postmenopausal): Secondary | ICD-10-CM | POA: Diagnosis not present

## 2021-01-16 DIAGNOSIS — E785 Hyperlipidemia, unspecified: Secondary | ICD-10-CM | POA: Diagnosis present

## 2021-01-16 DIAGNOSIS — E230 Hypopituitarism: Secondary | ICD-10-CM | POA: Diagnosis present

## 2021-01-16 DIAGNOSIS — Z87891 Personal history of nicotine dependence: Secondary | ICD-10-CM | POA: Diagnosis not present

## 2021-01-16 DIAGNOSIS — E271 Primary adrenocortical insufficiency: Secondary | ICD-10-CM | POA: Diagnosis present

## 2021-01-16 LAB — CBG MONITORING, ED
Glucose-Capillary: 49 mg/dL — ABNORMAL LOW (ref 70–99)
Glucose-Capillary: 51 mg/dL — ABNORMAL LOW (ref 70–99)
Glucose-Capillary: 158 mg/dL — ABNORMAL HIGH (ref 70–99)
Glucose-Capillary: 81 mg/dL (ref 70–99)

## 2021-01-16 LAB — VITAMIN B12: Vitamin B-12: 4607 pg/mL — ABNORMAL HIGH (ref 180–914)

## 2021-01-16 LAB — GLUCOSE, CAPILLARY
Glucose-Capillary: 116 mg/dL — ABNORMAL HIGH (ref 70–99)
Glucose-Capillary: 165 mg/dL — ABNORMAL HIGH (ref 70–99)
Glucose-Capillary: 170 mg/dL — ABNORMAL HIGH (ref 70–99)

## 2021-01-16 LAB — URINALYSIS, ROUTINE W REFLEX MICROSCOPIC
Bacteria, UA: NONE SEEN
Bilirubin Urine: NEGATIVE
Glucose, UA: NEGATIVE mg/dL
Ketones, ur: 5 mg/dL — AB
Nitrite: NEGATIVE
Protein, ur: NEGATIVE mg/dL
Specific Gravity, Urine: 1.01 (ref 1.005–1.030)
pH: 6 (ref 5.0–8.0)

## 2021-01-16 LAB — T4, FREE: Free T4: 1.27 ng/dL — ABNORMAL HIGH (ref 0.61–1.12)

## 2021-01-16 LAB — ECHOCARDIOGRAM COMPLETE
Area-P 1/2: 2.87 cm2
Height: 60 in
S' Lateral: 2.2 cm
Weight: 2080 oz

## 2021-01-16 LAB — BASIC METABOLIC PANEL
Anion gap: 7 (ref 5–15)
BUN: 14 mg/dL (ref 8–23)
CO2: 26 mmol/L (ref 22–32)
Calcium: 9.1 mg/dL (ref 8.9–10.3)
Chloride: 107 mmol/L (ref 98–111)
Creatinine, Ser: 1 mg/dL (ref 0.44–1.00)
GFR, Estimated: 58 mL/min — ABNORMAL LOW (ref >60)
Glucose, Bld: 174 mg/dL — ABNORMAL HIGH (ref 70–99)
Potassium: 3.5 mmol/L (ref 3.5–5.1)
Sodium: 140 mmol/L (ref 135–145)

## 2021-01-16 LAB — PROTIME-INR
INR: 1.1 (ref 0.8–1.2)
Prothrombin Time: 13.5 seconds (ref 11.4–15.2)

## 2021-01-16 LAB — HEMOGLOBIN A1C
Hgb A1c MFr Bld: 6.3 % — ABNORMAL HIGH (ref 4.8–5.6)
Mean Plasma Glucose: 134.11 mg/dL

## 2021-01-16 LAB — RESP PANEL BY RT-PCR (FLU A&B, COVID) ARPGX2
Influenza A by PCR: NEGATIVE
Influenza B by PCR: NEGATIVE
SARS Coronavirus 2 by RT PCR: NEGATIVE

## 2021-01-16 LAB — TSH: TSH: <0.01 u[IU]/mL — ABNORMAL LOW (ref 0.350–4.500)

## 2021-01-16 MED ORDER — STROKE: EARLY STAGES OF RECOVERY BOOK
Freq: Once | Status: DC
  Filled 2021-01-16: qty 1

## 2021-01-16 MED ORDER — IOHEXOL 350 MG/ML SOLN
75.0000 mL | Freq: Once | INTRAVENOUS | Status: AC | PRN
  Administered 2021-01-16: 75 mL via INTRAVENOUS

## 2021-01-16 MED ORDER — HYDRALAZINE HCL 20 MG/ML IJ SOLN: 10.0000 mg | Freq: Four times a day (QID) | INTRAMUSCULAR | Status: DC | PRN

## 2021-01-16 MED ORDER — ASPIRIN 300 MG RE SUPP: 300.0000 mg | Freq: Every day | RECTAL | Status: DC

## 2021-01-16 MED ORDER — ACETAMINOPHEN 325 MG PO TABS: 650.0000 mg | ORAL_TABLET | Freq: Four times a day (QID) | ORAL | Status: DC | PRN

## 2021-01-16 MED ORDER — METOPROLOL TARTRATE 5 MG/5ML IV SOLN: 5.0000 mg | Freq: Four times a day (QID) | INTRAVENOUS | Status: DC | PRN

## 2021-01-16 MED ORDER — DEXTROSE IN LACTATED RINGERS 5 % IV SOLN: INTRAVENOUS | Status: DC

## 2021-01-16 MED ORDER — ACETAMINOPHEN 650 MG RE SUPP: 650.0000 mg | Freq: Four times a day (QID) | RECTAL | Status: DC | PRN

## 2021-01-16 MED ORDER — SODIUM CHLORIDE (HYPERTONIC) 2 % OP SOLN
1.0000 [drp] | Freq: Two times a day (BID) | OPHTHALMIC | Status: DC
  Administered 2021-01-16 – 2021-01-17 (×2): 1 [drp] via OPHTHALMIC
  Filled 2021-01-16: qty 15

## 2021-01-16 MED ORDER — ENOXAPARIN SODIUM 30 MG/0.3ML ~~LOC~~ SOLN
30.0000 mg | SUBCUTANEOUS | Status: DC
  Administered 2021-01-16: 30 mg via SUBCUTANEOUS
  Filled 2021-01-16: qty 0.3

## 2021-01-16 MED ORDER — AMLODIPINE BESYLATE 5 MG PO TABS
5.0000 mg | ORAL_TABLET | Freq: Every day | ORAL | Status: DC
  Administered 2021-01-17: 5 mg via ORAL
  Filled 2021-01-16: qty 1

## 2021-01-16 MED ORDER — ASPIRIN 300 MG RE SUPP
300.0000 mg | Freq: Every day | RECTAL | Status: DC
  Administered 2021-01-16: 300 mg via RECTAL
  Filled 2021-01-16: qty 1

## 2021-01-16 MED ORDER — BISACODYL 10 MG RE SUPP
10.0000 mg | Freq: Once | RECTAL | Status: AC
  Administered 2021-01-16: 10 mg via RECTAL
  Filled 2021-01-16: qty 1

## 2021-01-16 MED ORDER — INSULIN ASPART 100 UNIT/ML ~~LOC~~ SOLN
0.0000 [IU] | Freq: Three times a day (TID) | SUBCUTANEOUS | Status: DC
  Administered 2021-01-16 – 2021-01-17 (×2): 1 [IU] via SUBCUTANEOUS

## 2021-01-16 MED ORDER — ASPIRIN 325 MG PO TABS
325.0000 mg | ORAL_TABLET | Freq: Every day | ORAL | Status: DC
  Administered 2021-01-17: 325 mg via ORAL
  Filled 2021-01-16: qty 1

## 2021-01-16 MED ORDER — ONDANSETRON HCL 4 MG/2ML IJ SOLN
4.0000 mg | Freq: Four times a day (QID) | INTRAMUSCULAR | Status: DC | PRN
  Filled 2021-01-16: qty 2

## 2021-01-16 MED ORDER — HYDROCORTISONE NA SUCCINATE PF 100 MG IJ SOLR
50.0000 mg | Freq: Three times a day (TID) | INTRAMUSCULAR | Status: DC
  Administered 2021-01-16 – 2021-01-17 (×4): 50 mg via INTRAVENOUS
  Filled 2021-01-16 (×5): qty 2

## 2021-01-16 MED ORDER — HYDROCORTISONE 5 MG PO TABS
5.0000 mg | ORAL_TABLET | Freq: Every day | ORAL | Status: DC
  Filled 2021-01-16: qty 1

## 2021-01-16 MED ORDER — PREDNISOLONE ACETATE 1 % OP SUSP
1.0000 [drp] | Freq: Two times a day (BID) | OPHTHALMIC | Status: DC
  Administered 2021-01-16 – 2021-01-17 (×2): 1 [drp] via OPHTHALMIC
  Filled 2021-01-16: qty 5

## 2021-01-16 MED ORDER — ASPIRIN 325 MG PO TABS: 325.0000 mg | ORAL_TABLET | Freq: Every day | ORAL | Status: DC

## 2021-01-16 MED ORDER — HYDROCORTISONE 10 MG PO TABS
10.0000 mg | ORAL_TABLET | Freq: Every day | ORAL | Status: DC
  Filled 2021-01-16: qty 1

## 2021-01-16 MED ORDER — ONDANSETRON HCL 4 MG PO TABS: 4.0000 mg | ORAL_TABLET | Freq: Four times a day (QID) | ORAL | Status: DC | PRN

## 2021-01-16 MED ORDER — LORAZEPAM 2 MG/ML IJ SOLN
0.5000 mg | Freq: Once | INTRAMUSCULAR | Status: AC
  Administered 2021-01-16: 0.5 mg via INTRAVENOUS
  Filled 2021-01-16: qty 1

## 2021-01-16 MED ORDER — HYDRALAZINE HCL 20 MG/ML IJ SOLN
5.0000 mg | Freq: Four times a day (QID) | INTRAMUSCULAR | Status: DC | PRN
  Administered 2021-01-16: 5 mg via INTRAVENOUS
  Filled 2021-01-16: qty 1

## 2021-01-16 MED ORDER — GLUCOSE 40 % PO GEL
ORAL | Status: AC
  Administered 2021-01-16: 75 g
  Filled 2021-01-16: qty 2

## 2021-01-16 MED ORDER — METOPROLOL TARTRATE 5 MG/5ML IV SOLN
5.0000 mg | Freq: Once | INTRAVENOUS | Status: AC
  Administered 2021-01-16: 5 mg via INTRAVENOUS
  Filled 2021-01-16: qty 5

## 2021-01-16 MED ORDER — SODIUM CHLORIDE 0.9 % IV SOLN: INTRAVENOUS | Status: DC

## 2021-01-16 MED ORDER — ENOXAPARIN SODIUM 40 MG/0.4ML ~~LOC~~ SOLN
40.0000 mg | SUBCUTANEOUS | Status: DC
  Administered 2021-01-17: 40 mg via SUBCUTANEOUS
  Filled 2021-01-16: qty 0.4

## 2021-01-16 MED ORDER — LEVOTHYROXINE SODIUM 75 MCG PO TABS: 75.0000 ug | ORAL_TABLET | Freq: Every day | ORAL | Status: DC

## 2021-01-16 MED ORDER — STROKE: EARLY STAGES OF RECOVERY BOOK: Freq: Once | Status: DC

## 2021-01-16 MED ORDER — DEXTROSE 50 % IV SOLN
INTRAVENOUS | Status: AC
  Administered 2021-01-16: 50 mL
  Filled 2021-01-16: qty 50

## 2021-01-16 NOTE — ED Notes (Signed)
Report given to Karie Schwalbe RN.

## 2021-01-16 NOTE — Progress Notes (Signed)
PT Cancellation Note  Patient Details Name: [name] MRN: [id number] DOB: January 28, 1946   Cancelled Treatment:    Reason Eval/Treat Not Completed: Patient at procedure or test/unavailable. Pt off unit at [facility], will follow up as time allows.   Arlyss Gandy 01/16/2021, 5:13 PM

## 2021-01-16 NOTE — Consult Note (Addendum)
NEUROLOGY CONSULTATION NOTE   Date of service: January 16, 2021 Patient Name: [name] MRN:  [id number] DOB:  January 29, 1946 Reason for consult: "Acute Right Basal Ganglia Infarct" _ _ _   _ __   _ __ _ _  __ __   _ __   __ _  History of Present Illness  [name] is a 75 y.o. 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 who presents with via EMS due to altered mental status.  According to family members, the patient is able to conduct her daily living activities at home with some assistance from family.  They usually communicate with her with captioned phone calls and have a camera at home to monitor her remotely.  There was no phone contact as usual in the evening and she could not be seen on the home cameras so her family went to check on her.  She was found by family members sitting on the toilet and she has stated that she had a fall earlier in the day.  There were no signs of of any apparent injury.  Her brother stated he felt that her speech was slurred the previous night when he went to visit her.  Her brother described that she was very talkative when he arrived, but EMS reported that since they arrived to the scene the patient has not been speaking much.  There has not being any complaints pain.  The patient was given lorazepam 0.5 mg IVP x1 dose earlier and she is unable to provide further information or answer review of systems.  Her son [name] was by bedside and provided HPI information.  He stated that she has had a similar episode in the past without any specific etiology being found during work-up.  Her son was at bedside he reports that his mother has been independent before this episode. She lives by herself and is able to cook and clean and watch tv as she wants. States last  well known was Wednesday 4/6 when his brother brought her dinner about 7-8 PM. He then spoke with her on the phone that evening at 9PM and she was normal then. He reports that she was unreachable by phone yesterday and so his brother went over to her house around 8. He found the patient on the toilet in the bathroom. She told him that she had fallen. The brother noticed that the patient had slurred speech and facial droop. It took about 15 minutes for the second brother to get to the house in that time she spoke less and less, by the time EMS arrived she was no longer speaking. Neither brother noticed specific acute weakness in their mother. The brother reports that in the past she has had similar episodes of acute change but she always recovered in about 3-5 hours and workup was negative.  Last known well-sometime on 01/14/2021. tPA given: No outside the window. Modified Rankin score at least  2   ROS  Unable to assess due to visual and auditory deficits  Constitutional   HEENT   Respiratory   CV   GI   GU   MSK   Skin   Neurological   Psychiatric    Past History   Past Medical History:  Diagnosis Date  . Adrenal insufficiency (Addison's disease) (HCC)   . Chorioretinal inflammation of  right eye 01/28/2020  . Hypertension   . Syphilis 01/28/2020  . Thyroid disease    hypothyroidism  . Type 2 diabetes mellitus (HCC)    Past Surgical History:  Procedure Laterality Date  . EYE SURGERY    . FEMUR IM NAIL Left 02/02/2018  . FEMUR IM NAIL Left 02/02/2018   Procedure: INTRAMEDULLARY (IM) NAIL LEFT FEMUR;  Surgeon: Roby LoftsHaddix, Kevin P, MD;  Location: [facility] OR;  Service: Orthopedics;  Laterality: Left;   Family History  Problem Relation Age of Onset  . Vitiligo Son    Social History   Socioeconomic History  . Marital status: Divorced    Spouse name: Not on file  . Number of children: Not on file  . Years of education: Not on file  . Highest education level: Not on file  Occupational  History  . Not on file  Tobacco Use  . Smoking status: Former Games developermoker  . Smokeless tobacco: Never Used  Vaping Use  . Vaping Use: Never used  Substance and Sexual Activity  . Alcohol use: Never  . Drug use: Never  . Sexual activity: Not on file  Other Topics Concern  . Not on file  Social History Narrative  . Not on file   Social Determinants of Health   Financial Resource Strain: Not on file  Food Insecurity: Not on file  Transportation Needs: Not on file  Physical Activity: Not on file  Stress: Not on file  Social Connections: Not on file   No Known Allergies  Medications   Medications Prior to Admission  Medication Sig Dispense Refill Last Dose  . hydrocortisone (CORTEF) 10 MG tablet Take 1 tablet (10 mg total) by mouth in the morning AND 0.5 tablets (5 mg total) daily in the afternoon. 135 tablet 3 Past Week at Unknown time  . levothyroxine (SYNTHROID) 75 MCG tablet Take 1 tablet (75 mcg total) by mouth daily. 90 tablet 3 Past Week at Unknown time  . Multiple Vitamins-Minerals (CENTRUM SILVER 50+WOMEN) TABS Take 1 tablet by mouth daily.   Past Week at Unknown time  . prednisoLONE acetate (PRED FORTE) 1 % ophthalmic suspension INSTILL 1 DROP INTO EACH EYE TWICE DAILY 5 mL 6 Past Week at Unknown time  . Propylene Glycol (SYSTANE BALANCE OP) Apply 2 drops to eye 3 (three) times daily as needed (dry eyes).    Past Week at Unknown time  . sodium chloride (MURO 128) 2 % ophthalmic solution Place 1 drop into both eyes 2 (two) times daily. 15 mL 0 Past Week at Unknown time  . polyethylene glycol (MIRALAX / GLYCOLAX) packet Take 17 g by mouth 2 (two) times daily. (until having soft bowel movement daily, then use as needed or daily) 14 each 0      Vitals   Vitals:   01/16/21 1015 01/16/21 1045 01/16/21 1306 01/16/21 1356  BP: (!) 167/97 (!) 153/82 (!) 171/67 (!) 167/79  Pulse: 66 68 71 68  Resp: 13 13 16 18   Temp:   98.5 F (36.9 C)   TempSrc:   Axillary   SpO2: 100% 100%  100% 98%  Weight:      Height:         Body mass index is 25.39 kg/m.  Physical Exam   General: Laying comfortably in bed; in no acute distress.  HENT: Normal oropharynx and mucosa. Normal external appearance of ears and nose.  Neck: Supple, no pain or tenderness  CV: No JVD. No peripheral edema.  Pulmonary: Symmetric  Chest rise. Normal respiratory effort.  Abdomen: Soft to touch, non-tender.  Ext: No cyanosis, edema, or deformity  Skin: No rash. Normal palpation of skin.   Musculoskeletal: Normal digits and nails by inspection. No clubbing.   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   CN IX & X Unable to assess   CN XI Able to turn head and move shoulders   CN XII Unable to assess   Motor:  Muscle bulk: reduced, tone generally reduced, mild tremor seen Able to lift arms off of bed to noxious stimuli bilaterally approx 3+/5 Able to move legs to noxious stimuli bilaterally approx 3-/5  Sensation:  Light touch Grimace to noxious stimuli in all 4 limbs   Pin prick    Temperature    Vibration   Proprioception    Coordination/Complex Motor:  Unable to assess  Labs   CBC:  Recent Labs  Lab 01/15/21 2233  WBC 7.6  NEUTROABS 4.6  HGB 12.3  HCT 38.7  MCV 80.6  PLT 130*    Basic Metabolic Panel:  Lab Results  Component Value Date   NA 140 01/16/2021   K 3.5 01/16/2021   CO2 26 01/16/2021   GLUCOSE 174 (H) 01/16/2021   BUN 14 01/16/2021   CREATININE 1.00 01/16/2021    CALCIUM 9.1 01/16/2021   GFRNONAA 58 (L) 01/16/2021   GFRAA 40 (L) 12/25/2019   Lipid Panel:  Lab Results  Component Value Date   LDLCALC 122 (H) 12/31/2019   HgbA1c:  Lab Results  Component Value Date   HGBA1C 6.3 (H) 01/16/2021   Urine Drug Screen: No results found for: LABOPIA, COCAINSCRNUR, LABBENZ, AMPHETMU, THCU, LABBARB  Alcohol Level No results found for: [facility]  CT Head without contrast: No acute intracranial abnormalities. Chronic atrophy and small vessel ischemic changes. Normal alignment of the cervical spine. Degenerative changes. No acute displaced fractures identified.   CT Angio Head & Neck Ordered   MRI Brain WO Contrast Incomplete, severely motion degraded examination. Acute right basal ganglia infarct   Vas US Carotid Right Carotid: Velocities in the right ICA are consistent with a 1-39%  stenosis.   Left Carotid: Velocities in the left ICA are consistent with a 60-79%  stenosis.   Vertebrals: Bilateral vertebral arteries demonstrate antegrade flow.  Subclavians: Normal flow hemodynamics were seen in bilateral subclavian arteries.    EEG Ordered   2D Echo Ordered   Impression   [name] is a 75 y.o. female with PMH significant for adrenal insufficiency, corrected no inflammation of the right eye, hypertension, syphilis, thyroid disease, hypothyroidism, type II DM, severe hearing and moderate visual impairment. 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 She is able to move all her limbs Arms>Legs. She has sensation in all 4 limbs. Is significantly drowsy which appears out of proportion to location of stroke.   Impression: R basal ganglia- Seen on MRI will further evaluate with CT  Angio Head and Neck Neurosyphilis- Could be the cause of her AMS out of proportion for her stroke    Recommendations  - 2D Echo ordered -Lipid Panel ordered -Start Aspirin 325 mg -Will  start High Intensity Statin pending LDL level -EEG ordered -CT Angio Head and Neck ordered -PT/OT evaluations ordered -NPO until SLP evaluation  -Telemetry -Frequent neurochecks Start normalizing blood pressure  Stroke order set placed in the chart. ______________________________________________________________________   Arna Snipe MD Resident  Attending addendum Patient seen and examined Agree with history and physical as above. Very difficult examination due to bilateral near blindness as well as deafness. Imaging reviewed personally-right basal ganglia stroke.  Likely small vessel etiology. Outside the window for IV TPA Location not consistent with LVO-is not a candidate for EVT. Recommendations as above   -- Milon Dikes, MD Neurologist [facility] Pager: [phone]

## 2021-01-16 NOTE — ED Notes (Signed)
Patient transported to xray then to MRI

## 2021-01-16 NOTE — ED Notes (Signed)
md at bedside talking to son

## 2021-01-16 NOTE — ED Notes (Signed)
Pt had an episode of vomiting in xray. Pt cleaned of vomit. No blood in emesis assessed. Purewick in place. Son remains at bedside. Unable to perform neuro check on patient due to chronic health conditions and patient's condition.

## 2021-01-16 NOTE — H&P (Signed)
History and Physical    [name] SHF:[id number] DOB: 1946-06-01 DOA: 01/15/2021  PCP: Doristine Bosworth, MD  Patient coming from: Home.  I have personally briefly reviewed patient's old medical records in [facility]  Chief Complaint: AMS.  HPI: [name] is a 75 y.o. female with medical history significant of adrenal insufficiency, corrected no inflammation of the right eye, hypertension, syphilis, thyroid disease, hypothyroidism, type II DM, severe hearing moderate visual impairment who was brought to the emergency department via EMS due to altered mental status.  According to family members, the patient is able to conduct her daily living activities at home with some assistance from family.  They usually communicate with her with captioned phone calls and have a camera at home to monitor her remotely.  There was no phone contact as usual in the evening and she could not be seen on the home cameras so her family went to check on her.  She was found by family members sitting on the toilet and she has stated that she had a fall earlier in the day.  There were no signs of of any apparent injury.  Her brother stated he felt that her speech was slurred the previous night when he went to visit her.  Her brother described that she was very talkative when he arrived, but EMS reported that since they arrived to the scene the patient has not been speaking much.  There has not being any complaints pain.  The patient was given lorazepam 0.5 mg IVP x1 dose earlier and she is unable to provide further information or answer review of systems.  Her son Sidonie Dickens was by bedside and provided HPI information.  He stated that she has had a similar episode in the past without any specific etiology being found during work-up.  ED Course: Initial vital signs were temperature 99 F, pulse 66, respirations 12, BP 161/132 mmHg O2 sat 97% on room air.  The patient received a 500 mL NS bolus and lorazepam 0.5 mg IVP  x1 dose.  I added Lopressor 5 mg IVP x1 dose while in the ED.  Lab work: Her urinalysis shows small hemoglobinuria, ketonuria 5 mg/dL and trace leukocyte esterase.  The rest of the UA was normal.  CBC showed a white count of 7.6, hemoglobin 12.3 g/dL platelets 502.  CMP showed a creatinine of 1.12 mg/dL with a GFR firewood 51 mL/min.  Total protein 6.1 g/dL.  The rest of the value of the CMP were normal.  Imaging: One-view portable chest radiograph did not show any active disease.  There was aortic calcifications seen.  Review of Systems: As per HPI otherwise all other systems reviewed and are negative.  Past Medical History:  Diagnosis Date  . Adrenal insufficiency (Addison's disease) (HCC)   . 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 Hypertension   . Syphilis 01/28/2020  . Thyroid disease    hypothyroidism  . Type 2 diabetes mellitus (HCC)     Past Surgical History:  Procedure Laterality Date  . EYE SURGERY    . FEMUR IM NAIL Left 02/02/2018  . FEMUR IM NAIL Left 02/02/2018   Procedure: INTRAMEDULLARY (IM) NAIL LEFT FEMUR;  Surgeon: Roby Lofts, MD;  Location: [facility] OR;  Service: Orthopedics;  Laterality: Left;    Social History  reports that she has quit smoking. She has never used smokeless tobacco. She reports that she does not drink alcohol and does not use drugs.  No Known Allergies  Family History  Problem Relation Age of Onset  . Vitiligo Son    Prior to Admission medications   Medication Sig Start Date End Date Taking? Authorizing Provider  hydrocortisone (CORTEF) 10 MG tablet Take 1 tablet (10 mg total) by mouth in the morning AND 0.5 tablets (5 mg total) daily in the afternoon. 02/14/20  Yes Shamleffer, Konrad Dolores, MD  levothyroxine (SYNTHROID) 75 MCG tablet Take 1 tablet (75 mcg total) by mouth daily. 05/17/20  Yes Shamleffer, Konrad Dolores, MD  Multiple Vitamins-Minerals (CENTRUM SILVER 50+WOMEN) TABS Take 1 tablet by mouth daily.   Yes [provider]  prednisoLONE acetate (PRED FORTE) 1 % ophthalmic suspension INSTILL 1 DROP INTO EACH EYE TWICE DAILY 08/03/19  Yes Stallings, Zoe A, MD  Propylene Glycol (SYSTANE BALANCE OP) Apply 2 drops to eye 3 (three) times daily as needed (dry eyes).    Yes [provider]  sodium chloride (MURO 128) 2 % ophthalmic solution Place 1 drop into both eyes 2 (two) times daily. 04/18/18  Yes Doristine Bosworth, MD  polyethylene glycol (MIRALAX / GLYCOLAX) packet Take 17 g by mouth 2 (two) times daily. (until having soft bowel movement daily, then use as needed or daily) 02/06/18   Zigmund Daniel., MD    Physical Exam: Vitals:   01/16/21 0100 01/16/21 0200 01/16/21 0238 01/16/21 0345  BP: (!) 166/155 (!) 155/140 (!) 240/80 (!) 196/93  Pulse: 83 78  81  Resp: 14 17  13   Temp:      TempSrc:      SpO2: 100% 99%  100%  Weight:      Height:        Constitutional: NAD, somnolent. Eyes: PERRL, lids and conjunctivae normal ENMT: Mucous membranes are mildly dry. Posterior pharynx clear of any exudate or lesions. Neck: normal, supple, no masses, no thyromegaly Respiratory: Clear to auscultation bilaterally, no wheezing, no crackles. Normal respiratory effort. No accessory muscle use.  Cardiovascular: Regular rate and rhythm, no murmurs / rubs / gallops. No extremity edema. 2+ pedal pulses. No carotid bruits.  Abdomen: No distention.  Bowel sounds positive.  Soft, no tenderness, no masses palpated. No hepatosplenomegaly. Bowel sounds positive.  Musculoskeletal: no clubbing / cyanosis. Good ROM, no contractures. Normal muscle tone.  Skin: Has some old abrasions her right knee. Neurologic: Sedated.  She seems to be grossly nonfocal. Psychiatric: Sedated.  Somnolent.  Labs on Admission: I have personally reviewed following labs and imaging studies  CBC: Recent Labs  Lab 01/15/21 2233  WBC 7.6  NEUTROABS 4.6  HGB 12.3  HCT 38.7  MCV 80.6  PLT 130*    Basic Metabolic  Panel: Recent Labs  Lab 01/15/21 2233  NA 141  K 4.1  CL 107  CO2 25  GLUCOSE 80  BUN 20  CREATININE 1.12*  CALCIUM 9.5    GFR: Estimated Creatinine Clearance: 34.3 mL/min (A) (by C-G formula based on SCr of 1.12 mg/dL (H)).  Liver Function Tests: Recent Labs  Lab 01/15/21 2233  AST 31  ALT 14  ALKPHOS 58  BILITOT 0.6  PROT 6.1*  ALBUMIN 3.5    Urine analysis:    Component Value Date/Time   COLORURINE YELLOW 01/16/2021 0122   APPEARANCEUR CLEAR 01/16/2021 0122   LABSPEC 1.010 01/16/2021 0122   PHURINE 6.0 01/16/2021 0122   GLUCOSEU NEGATIVE 01/16/2021 0122   HGBUR SMALL (A) 01/16/2021 0122   BILIRUBINUR NEGATIVE 01/16/2021 0122   KETONESUR 5 (A) 01/16/2021 0122   PROTEINUR NEGATIVE  01/16/2021 0122   NITRITE NEGATIVE 01/16/2021 0122   LEUKOCYTESUR TRACE (A) 01/16/2021 0122   Radiological Exams on Admission: CT Head Wo Contrast  Result Date: 01/16/2021 CLINICAL DATA:  Altered mental status of unknown cause. EXAM: CT HEAD WITHOUT CONTRAST CT CERVICAL SPINE WITHOUT CONTRAST TECHNIQUE: Multidetector CT imaging of the head and cervical spine was performed following the standard protocol without intravenous contrast. Multiplanar CT image reconstructions of the cervical spine were also generated. COMPARISON:  CT head 03/19/2019.  CT cervical spine 02/02/2018 FINDINGS: CT HEAD FINDINGS Brain: Diffuse cerebral atrophy. Ventricular dilatation likely due to central atrophy. Low-attenuation changes in the deep white matter likely due to small vessel ischemia. Cavum septum pellucidum. No mass-effect or midline shift. No abnormal extra-axial fluid collections. Gray-white matter junctions are distinct. Basal cisterns are not effaced. No acute intracranial hemorrhage. Vascular: Moderate intracranial arterial vascular calcifications. Skull: Calvarium appears intact. No acute displaced fractures identified. 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 Sinuses/Orbits: Paranasal sinuses and mastoid air cells are clear. Other: None. CT CERVICAL SPINE FINDINGS Alignment: Normal alignment of the cervical vertebrae and facet joints. Skull base and vertebrae: Skull base appears intact. No vertebral compression deformities. No focal bone lesion or bone destruction. Degenerative changes noted in the temporomandibular joints bilaterally. Soft tissues and spinal canal: No prevertebral soft tissue swelling. No abnormal paraspinal soft tissue mass or infiltration. Calcifications in the cervical carotid arteries. Disc levels: Degenerative changes with narrowed interspaces and endplate hypertrophic changes throughout. Mild degenerative changes in the facet joints. Upper chest: Lung apices are clear. Other: None. IMPRESSION: 1. No acute intracranial abnormalities. Chronic atrophy and small vessel ischemic changes. 2. Normal alignment of the cervical spine. Degenerative changes. No acute displaced fractures identified. Electronically Signed   By: Burman NievesWilliam  Stevens M.D.   On: 01/16/2021 00:25   CT Cervical Spine Wo Contrast  Result Date: 01/16/2021 CLINICAL DATA:  Altered mental status of unknown cause. EXAM: CT HEAD WITHOUT CONTRAST CT CERVICAL SPINE WITHOUT CONTRAST TECHNIQUE: Multidetector CT imaging of the head and cervical spine was performed following the standard protocol without intravenous contrast. Multiplanar CT image reconstructions of the cervical spine were also generated. COMPARISON:  CT head 03/19/2019.  CT cervical spine 02/02/2018 FINDINGS: CT HEAD FINDINGS Brain: Diffuse cerebral atrophy. Ventricular dilatation likely due to central atrophy. Low-attenuation changes in the deep white matter likely due to small vessel ischemia. Cavum septum pellucidum. No mass-effect or midline shift. No abnormal extra-axial fluid collections. Gray-white matter junctions are distinct. Basal cisterns are not effaced. No acute  intracranial hemorrhage. Vascular: Moderate intracranial arterial vascular calcifications. Skull: Calvarium appears intact. No acute displaced fractures identified. 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 Sinuses/Orbits: Paranasal sinuses and mastoid air cells are clear. Other: None. CT CERVICAL SPINE FINDINGS Alignment: Normal alignment of the cervical vertebrae and facet joints. Skull base and vertebrae: Skull base appears intact. No vertebral compression deformities. No focal bone lesion or bone destruction. Degenerative changes noted in the temporomandibular joints bilaterally. Soft tissues and spinal canal: No prevertebral soft tissue swelling. No abnormal paraspinal soft tissue mass or infiltration. Calcifications in the cervical carotid arteries. Disc levels: Degenerative changes with narrowed interspaces and endplate hypertrophic changes throughout. Mild degenerative changes in the facet joints. Upper chest: Lung apices are clear. Other: None. IMPRESSION: 1. No acute intracranial abnormalities. Chronic atrophy and small vessel ischemic changes.  2. Normal alignment of the cervical spine. Degenerative changes. No acute displaced fractures identified. Electronically Signed   By: Burman Nieves M.D.   On: 01/16/2021 00:25   DG Chest Portable 1 View  Result Date: 01/16/2021 CLINICAL DATA:  Altered mental status EXAM: PORTABLE CHEST 1 VIEW COMPARISON:  December 25, 2019 FINDINGS: The heart size is stable. Aortic calcifications are noted. There is no pneumothorax or large pleural effusion. No definite focal infiltrate. Incidentally noted are left-sided carotid artery calcifications. IMPRESSION: No active disease. Electronically Signed   By: Katherine Mantle M.D.   On: 01/16/2021 00:06    EKG: Independently reviewed.   Assessment/Plan Principal Problem:   Acute metabolic  encephalopathy Observation/telemetry. Neurochecks every 4 hours. Continue gentle IV hydration. Check carotid Doppler (Left-sided coronary artery calcifications) Consider MRI imaging if no improvement. Continue supportive care. Consult TOC team.  Active Problems:   Hypertension Begin amlodipine 5 mg p.o. daily. Metoprolol 5 mg IVP every 6 hours as needed. Monitor blood pressure closely. 1 to 2 weeks follow-up with PCP after discharge.    Thrombocytopenia (HCC) Monitor platelet count.    Adrenal insufficiency (HCC) Continue hydrocortisone 10 mg p.o. daily. Continue hydrocortisone 5 mg p.o. daily in the afternoon.    Hypothyroidism Continue levothyroxine 75 mcg p.o. daily. Most recent TSH levels have been low. Will repeat TSH level later today.    Type II diabetes mellitus (HCC) Carbohydrate modified diet. CBG monitoring with RI SS. Check hemoglobin A1c.    DVT prophylaxis: Lovenox SQ. Code Status:   Full code. Family Communication:   Disposition Plan:   Patient is from:  Home.  Anticipated DC to:  Home.  Anticipated DC date:  01/16/2021 or 01/17/2021.  Anticipated DC barriers: Clinical status.  Consults called: Admission status:  Observation/telemetry.  Severity of Illness:  High severity due to change in mental status from baseline with behavioral changes and history of fall.  The patient will need to remain in the hospital for close monitoring and further evaluation as it is not safe at the moment for her to be discharged.  Bobette Mo MD [facility]  How to contact the [facility] Attending or Consulting provider 7A - 7P or covering provider during after hours 7P -7A, for this patient?   1. Check the care team in [facility] and look for a) attending/consulting [facility] provider listed and b) the [facility] team listed 2. Log into [email or web address] and use [place] universal password to access. If you do not have the password, please contact the hospital  operator. 3. Locate the [facility] provider you are looking for under [facility] and page to a number that you can be directly reached. 4. If you still have difficulty reaching the provider, please page the [facility] (Director on Call) for the Hospitalists listed on amion for assistance.  01/16/2021, 4:30 AM   This document was prepared using Dragon voice recognition software and may contain some unintended transcription errors.

## 2021-01-16 NOTE — ED Notes (Signed)
Per protocol for hypoglycemia not awake and wouldn't take juice.  2 tubes of glucose gel administered

## 2021-01-16 NOTE — ED Notes (Signed)
PTAR cancelled 

## 2021-01-16 NOTE — Progress Notes (Signed)
Carotid duplex bilateral study completed.   Please see CV Proc for preliminary results.   Saman Umstead, RDMS, RVT  

## 2021-01-16 NOTE — Progress Notes (Signed)
  Echocardiogram 2D Echocardiogram has been performed.  Delcie Roch 01/16/2021, 5:15 PM

## 2021-01-16 NOTE — Progress Notes (Signed)
EEG will be performed in morning, patient is resting.

## 2021-01-16 NOTE — ED Notes (Signed)
Nurse notified of pt's CBG resulting @ 51

## 2021-01-16 NOTE — ED Notes (Signed)
Attempted to call report; RN unable to take report at this time.

## 2021-01-16 NOTE — Progress Notes (Addendum)
  Admission HPI reviewed.  Patient seen and examined. She was sleepy, said few worlds when I move her legs, was complaining of pain. She is hard of hearing , deaf,.   1-Acute metabolic Encephalopathy:  -BP elevated, plan to get MRI to rule out Stroke and PRESS.  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  -Neurology consulted.  -Neurology will give recommendation in regards Carotid doppler: : Velocities in the left ICA are consistent with a 60-79%  -Permissive HT>  -Started aspirin  2-History of Adrenal Insufficiency;  Change hydrocortisone to IV to avoid adrenal crisis.   3-Hypothyroidism:  TSH low. Plan to check Free T 3 and T 4. Adjust medications as needed/   4-Hypoglycemia; Started on D 5 LR>  5-Constipation;  Dulcolax ordered.   Son Updated at bedside.   Joanne Skufca Md.

## 2021-01-17 ENCOUNTER — Inpatient Hospital Stay (HOSPITAL_COMMUNITY): Payer: Medicare Other

## 2021-01-17 ENCOUNTER — Other Ambulatory Visit: Payer: Self-pay | Admitting: Cardiology

## 2021-01-17 DIAGNOSIS — I639 Cerebral infarction, unspecified: Secondary | ICD-10-CM

## 2021-01-17 DIAGNOSIS — I6330 Cerebral infarction due to thrombosis of unspecified cerebral artery: Secondary | ICD-10-CM | POA: Insufficient documentation

## 2021-01-17 LAB — CBC
HCT: 36.9 % (ref 36.0–46.0)
Hemoglobin: 12.6 g/dL (ref 12.0–15.0)
MCH: 26.5 pg (ref 26.0–34.0)
MCHC: 34.1 g/dL (ref 30.0–36.0)
MCV: 77.5 fL — ABNORMAL LOW (ref 80.0–100.0)
Platelets: 276 10*3/uL (ref 150–400)
RBC: 4.76 MIL/uL (ref 3.87–5.11)
RDW: 13.8 % (ref 11.5–15.5)
WBC: 8.7 10*3/uL (ref 4.0–10.5)
nRBC: 0 % (ref 0.0–0.2)

## 2021-01-17 LAB — BASIC METABOLIC PANEL
Anion gap: 6 (ref 5–15)
BUN: 11 mg/dL (ref 8–23)
CO2: 28 mmol/L (ref 22–32)
Calcium: 9.6 mg/dL (ref 8.9–10.3)
Chloride: 106 mmol/L (ref 98–111)
Creatinine, Ser: 1.05 mg/dL — ABNORMAL HIGH (ref 0.44–1.00)
GFR, Estimated: 55 mL/min — ABNORMAL LOW (ref >60)
Glucose, Bld: 197 mg/dL — ABNORMAL HIGH (ref 70–99)
Potassium: 3.7 mmol/L (ref 3.5–5.1)
Sodium: 140 mmol/L (ref 135–145)

## 2021-01-17 LAB — LIPID PANEL
Cholesterol: 179 mg/dL (ref 0–200)
HDL: 43 mg/dL (ref >40)
LDL Cholesterol: 120 mg/dL — ABNORMAL HIGH (ref 0–99)
Total CHOL/HDL Ratio: 4.2 RATIO
Triglycerides: 79 mg/dL (ref <150)
VLDL: 16 mg/dL (ref 0–40)

## 2021-01-17 LAB — URINE CULTURE: Culture: <10000 — AB

## 2021-01-17 LAB — GLUCOSE, CAPILLARY
Glucose-Capillary: 177 mg/dL — ABNORMAL HIGH (ref 70–99)
Glucose-Capillary: 111 mg/dL — ABNORMAL HIGH (ref 70–99)
Glucose-Capillary: 122 mg/dL — ABNORMAL HIGH (ref 70–99)

## 2021-01-17 LAB — T3, FREE: T3, Free: 2.5 pg/mL (ref 2.0–4.4)

## 2021-01-17 MED ORDER — ROSUVASTATIN CALCIUM 20 MG PO TABS: 20.0000 mg | ORAL_TABLET | Freq: Every day | ORAL | 1 refills | Status: AC

## 2021-01-17 MED ORDER — ASPIRIN EC 81 MG PO TBEC: 81.0000 mg | DELAYED_RELEASE_TABLET | Freq: Every day | ORAL | 2 refills | Status: AC

## 2021-01-17 MED ORDER — ROSUVASTATIN CALCIUM 20 MG PO TABS
20.0000 mg | ORAL_TABLET | Freq: Every day | ORAL | Status: DC
  Administered 2021-01-17: 20 mg via ORAL
  Filled 2021-01-17: qty 1

## 2021-01-17 MED ORDER — AMLODIPINE BESYLATE 5 MG PO TABS: 5.0000 mg | ORAL_TABLET | Freq: Every day | ORAL | 1 refills | Status: AC

## 2021-01-17 MED ORDER — CLOPIDOGREL BISULFATE 75 MG PO TABS: 75.0000 mg | ORAL_TABLET | Freq: Every day | ORAL | 0 refills | Status: AC

## 2021-01-17 NOTE — Discharge Instructions (Signed)
Our office will call to arrange 30 day monitor to see if irregular hart beat caused your stroke.

## 2021-01-17 NOTE — Discharge Summary (Signed)
Physician Discharge Summary  Ankita Newcomer ZOX:[id number] DOB: 12/22/1945 DOA: 01/15/2021  PCP: Doristine Bosworth, MD  Admit date: 01/15/2021 Discharge date: 01/17/2021  Admitted From: Home Disposition: Home  Recommendations for Outpatient Follow-up:  1. Follow up with PCP in 1-2 weeks 2. Follow-up with neurology in 2 to 4 weeks 3. Follow-up with cardiology for a 30-day monitor. 4. Please obtain BMP/CBC in one week 5. Please follow up on the following pending results: EEG  Home Health: Yes Equipment/Devices: None Discharge Condition: Stable CODE STATUS: Full Diet recommendation: Heart Healthy / Carb Modified   Brief/Interim Summary: Jonell Brumbaugh is a 75 y.o. female with medical history significant of adrenal insufficiency, corrected no inflammation of the right eye, hypertension, syphilis, thyroid disease, hypothyroidism, type II DM, severe hearing moderate visual impairment who was brought to the emergency department via EMS due to altered mental status.  According to family members, the patient is able to conduct her daily living activities at home with some assistance from family.  They usually communicate with her with captioned phone calls and have a camera at home to monitor her remotely.  There was no phone contact as usual in the evening and she could not be seen on the home cameras so her family went to check on her.  She was found by family members sitting on the toilet and she has stated that she had a fall earlier in the day.  There were no signs of of any apparent injury.  Her brother stated he felt that her speech was slurred the previous night when he went to visit her. Patient received 1 dose of lorazepam 0.5 mg IV with EMS and was very drowsy on arrival.  CT head was without any intracranial abnormalities.  MRI brain with acute right Basal ganglia infarct.  Unable to assess her complete deficits as patient is deaf and blind and does not cooperate with exam.  Moving all  extremities and trying to get out of bed when seen today. Patient wants to go home and per son she always become agitated when not at home. Neurology was consulted and she completed the stroke work-up.  Vascular carotid ultrasound with left ICA stenosis of about 60 to 79%.  EEG was done pending results.  2D echocardiogram without any significant abnormality, and normal EF and grade 1 diastolic dysfunction.  She was started on high intensity statin, DAPT with aspirin and Plavix for 3 weeks followed by continuation of aspirin.  We also talked with cardiology and they will call her earlier next week to arrange a 30-day monitor to rule out A. Fib. Her blood pressure was elevated so she was started on a low-dose amlodipine.  Her primary care provider should be able to monitor and titrate accordingly.  She did receive stress doses of steroid for her adrenal insufficiency and will resume home dose of hydrocortisone at home.  She has well-controlled diabetes with A1c of 6.3 which is being controlled with diet currently.  She has an history of hypothyroidism.  TSH was low and free T4 was mildly elevated along with normal free T3.  She might need some adjustment in her Synthroid dose which can be done by her PCP as an outpatient.  She will continue rest of her home meds and follow-up with her providers.  Discharge Diagnoses:  Principal Problem:   Acute metabolic encephalopathy Active Problems:   Thrombocytopenia (HCC)   Adrenal insufficiency (HCC)   Hypothyroidism   Type II diabetes mellitus (HCC)   Hypertension  Cerebral thrombosis with cerebral infarction   Discharge Instructions  Discharge Instructions    Diet - low sodium heart healthy   Complete by: As directed    Discharge instructions   Complete by: As directed    It was pleasure taking care of you. You are being started on few new medications.  You will take aspirin and Plavix both together for 3 weeks and then discontinue Plavix and  continue with aspirin only. You are also being started on a new cholesterol medicine called Crestor. You are also been started on a blood pressure medication called amlodipine. You will need a cardiac monitor to see if you are experiencing any abnormal rhythm which can cause stroke, you will get a call from cardiology office earlier next week for the appointment to get your monitor. Follow-up with neurology in 2 to 4 weeks. Follow-up with your primary care doctor.   Increase activity slowly   Complete by: As directed      Allergies as of 01/17/2021   No Known Allergies     Medication List    TAKE these medications   amLODipine 5 MG tablet Commonly known as: NORVASC Take 1 tablet (5 mg total) by mouth daily with breakfast. Start taking on: January 18, 2021   aspirin EC 81 MG tablet Take 1 tablet (81 mg total) by mouth daily. Swallow whole.   Centrum Silver 50+Women Tabs Take 1 tablet by mouth daily.   clopidogrel 75 MG tablet Commonly known as: PLAVIX Take 1 tablet (75 mg total) by mouth daily for 21 days.   hydrocortisone 10 MG tablet Commonly known as: CORTEF Take 1 tablet (10 mg total) by mouth in the morning AND 0.5 tablets (5 mg total) daily in the afternoon.   levothyroxine 75 MCG tablet Commonly known as: SYNTHROID Take 1 tablet (75 mcg total) by mouth daily.   polyethylene glycol 17 g packet Commonly known as: MIRALAX / GLYCOLAX Take 17 g by mouth 2 (two) times daily. (until having soft bowel movement daily, then use as needed or daily)   prednisoLONE acetate 1 % ophthalmic suspension Commonly known as: PRED FORTE INSTILL 1 DROP INTO EACH EYE TWICE DAILY   rosuvastatin 20 MG tablet Commonly known as: CRESTOR Take 1 tablet (20 mg total) by mouth daily. Start taking on: January 18, 2021   sodium chloride 2 % ophthalmic solution Commonly known as: MURO 128 Place 1 drop into both eyes 2 (two) times daily.   SYSTANE BALANCE OP Apply 2 drops to eye 3 (three) times  daily as needed (dry eyes).       Follow-up Information    Doristine Bosworth, MD. Schedule an appointment as soon as possible for a visit.   Specialty: Internal Medicine Contact information: [address] [place] [address] [place] [address] [phone]        Micki Riley, MD. Schedule an appointment as soon as possible for a visit in 1 month(s).   Specialties: Neurology, Radiology Contact information: [address] [place] [address] [phone]              No Known Allergies  Consultations:  Neurology  Procedures/Studies: CT ANGIO HEAD W OR WO CONTRAST  Result Date: 01/16/2021 CLINICAL DATA:  Encephalopathy EXAM: CT ANGIOGRAPHY HEAD AND NECK TECHNIQUE: Multidetector CT imaging of the head and neck was performed using the standard protocol during bolus administration of intravenous contrast. Multiplanar CT image reconstructions and MIPs were obtained to evaluate  the vascular anatomy. Carotid stenosis measurements (when applicable) are obtained utilizing NASCET criteria, using the distal internal carotid diameter as the denominator. CONTRAST:  75mL OMNIPAQUE IOHEXOL 350 MG/ML SOLN COMPARISON:  Brain MRI 01/16/2021 FINDINGS: CT HEAD FINDINGS Brain: Early subacute infarct of the right basal ganglia. No intracranial hemorrhage. Moderate motion degradation. No hydrocephalus. Incidentally noted cavum septum pellucidum et vergae. Skull: The visualized skull base, calvarium and extracranial soft tissues are normal. Sinuses/Orbits: No fluid levels or advanced mucosal thickening of the visualized paranasal sinuses. No mastoid or middle ear effusion. The orbits are normal. CTA NECK FINDINGS SKELETON: There is no bony spinal canal stenosis. No lytic or blastic lesion. OTHER NECK: Normal pharynx, larynx and major salivary glands. No cervical lymphadenopathy. Unremarkable thyroid gland. UPPER CHEST: No pneumothorax or pleural effusion. No nodules or masses.  AORTIC ARCH: There is calcific atherosclerosis of the aortic arch. There is no aneurysm, dissection or hemodynamically significant stenosis of the visualized portion of the aorta. Conventional 3 vessel aortic branching pattern. The visualized proximal subclavian arteries are widely patent. RIGHT CAROTID SYSTEM: Normal without aneurysm, dissection or stenosis. LEFT CAROTID SYSTEM: No dissection, occlusion or aneurysm. There is mixed density atherosclerosis extending into the proximal ICA, resulting in less than 50% stenosis. VERTEBRAL ARTERIES: Right dominant configuration. Both origins are clearly patent. There is no dissection, occlusion or flow-limiting stenosis to the skull base (V1-V3 segments). CTA HEAD FINDINGS POSTERIOR CIRCULATION: --Vertebral arteries: Left V4 segment terminates in PICA. Normal right. --Inferior cerebellar arteries: Normal. --Basilar artery: Normal. --Superior cerebellar arteries: Normal. --Posterior cerebral arteries (PCA): Normal. ANTERIOR CIRCULATION: --Intracranial internal carotid arteries: Atherosclerotic calcification of both internal carotid arteries at the skull base with bilateral moderate-to-severe stenosis, worse on the right. --Anterior cerebral arteries (ACA): Normal. Both A1 segments are present. Patent anterior communicating artery (a-comm). --Middle cerebral arteries (MCA): Normal. VENOUS SINUSES: As permitted by contrast timing, patent. ANATOMIC VARIANTS: Small bilateral posterior communicating arteries present. Review of the MIP images confirms the above findings. IMPRESSION: 1. Early subacute infarct of the right basal ganglia. No intracranial hemorrhage or mass effect. 2. No emergent large vessel occlusion. 3. 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 Aortic Atherosclerosis (ICD10-I70.0). Electronically Signed   By: Deatra Robinson M.D.   On: 01/16/2021 19:19   DG Abdomen 1 View  Result Date:  01/16/2021 CLINICAL DATA:  Pre MRI assessment EXAM: ABDOMEN - 1 VIEW COMPARISON:  May 24, 2004 FINDINGS: Postoperative screw and rod fixation noted in the proximal left femur. No other metallic structures evident. There is moderate stool throughout the colon. There is no bowel dilatation or air-fluid level to suggest bowel obstruction. No free air. There are scattered foci of arterial vascular calcification IMPRESSION: Screw and rod fixation in the proximal left femur. No other metallic structures noted on this study. Moderate stool in colon. No bowel obstruction or free air evident. Electronically Signed   By: Bretta Bang III M.D.   On: 01/16/2021 11:41   CT Head Wo Contrast  Result Date: 01/16/2021 CLINICAL DATA:  Altered mental status of unknown cause. EXAM: CT HEAD WITHOUT CONTRAST CT CERVICAL SPINE WITHOUT CONTRAST TECHNIQUE: Multidetector CT imaging of the head and cervical spine was performed following the standard protocol without intravenous contrast. Multiplanar CT image reconstructions of the cervical spine were also generated. COMPARISON:  CT head 03/19/2019.  CT cervical spine 02/02/2018 FINDINGS: CT HEAD FINDINGS Brain: Diffuse cerebral atrophy. Ventricular dilatation likely due to central atrophy. Low-attenuation changes in  the deep white matter likely due to small vessel ischemia. Cavum septum pellucidum. No mass-effect or midline shift. No abnormal extra-axial fluid collections. Gray-white matter junctions are distinct. Basal cisterns are not effaced. No acute intracranial hemorrhage. Vascular: Moderate intracranial arterial vascular calcifications. Skull: Calvarium appears intact. No acute displaced fractures identified. 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 Sinuses/Orbits: Paranasal sinuses and mastoid air cells are clear. Other: None. CT CERVICAL SPINE FINDINGS Alignment: Normal alignment  of the cervical vertebrae and facet joints. Skull base and vertebrae: Skull base appears intact. No vertebral compression deformities. No focal bone lesion or bone destruction. Degenerative changes noted in the temporomandibular joints bilaterally. Soft tissues and spinal canal: No prevertebral soft tissue swelling. No abnormal paraspinal soft tissue mass or infiltration. Calcifications in the cervical carotid arteries. Disc levels: Degenerative changes with narrowed interspaces and endplate hypertrophic changes throughout. Mild degenerative changes in the facet joints. Upper chest: Lung apices are clear. Other: None. IMPRESSION: 1. No acute intracranial abnormalities. Chronic atrophy and small vessel ischemic changes. 2. Normal alignment of the cervical spine. Degenerative changes. No acute displaced fractures identified. Electronically Signed   By: Burman Nieves M.D.   On: 01/16/2021 00:25   CT ANGIO NECK W OR WO CONTRAST  Result Date: 01/16/2021 CLINICAL DATA:  Encephalopathy EXAM: CT ANGIOGRAPHY HEAD AND NECK TECHNIQUE: Multidetector CT imaging of the head and neck was performed using the standard protocol during bolus administration of intravenous contrast. Multiplanar CT image reconstructions and MIPs were obtained to evaluate the vascular anatomy. Carotid stenosis measurements (when applicable) are obtained utilizing NASCET criteria, using the distal internal carotid diameter as the denominator. CONTRAST:  75mL OMNIPAQUE IOHEXOL 350 MG/ML SOLN COMPARISON:  Brain MRI 01/16/2021 FINDINGS: CT HEAD FINDINGS Brain: Early subacute infarct of the right basal ganglia. No intracranial hemorrhage. Moderate motion degradation. No hydrocephalus. Incidentally noted cavum septum pellucidum et vergae. Skull: The visualized skull base, calvarium and extracranial soft tissues are normal. Sinuses/Orbits: No fluid levels or advanced mucosal thickening of the visualized paranasal sinuses. No mastoid or middle ear effusion.  The orbits are normal. CTA NECK FINDINGS SKELETON: There is no bony spinal canal stenosis. No lytic or blastic lesion. OTHER NECK: Normal pharynx, larynx and major salivary glands. No cervical lymphadenopathy. Unremarkable thyroid gland. UPPER CHEST: No pneumothorax or pleural effusion. No nodules or masses. AORTIC ARCH: There is calcific atherosclerosis of the aortic arch. There is no aneurysm, dissection or hemodynamically significant stenosis of the visualized portion of the aorta. Conventional 3 vessel aortic branching pattern. The visualized proximal subclavian arteries are widely patent. RIGHT CAROTID SYSTEM: Normal without aneurysm, dissection or stenosis. LEFT CAROTID SYSTEM: No dissection, occlusion or aneurysm. There is mixed density atherosclerosis extending into the proximal ICA, resulting in less than 50% stenosis. VERTEBRAL ARTERIES: Right dominant configuration. Both origins are clearly patent. There is no dissection, occlusion or flow-limiting stenosis to the skull base (V1-V3 segments). CTA HEAD FINDINGS POSTERIOR CIRCULATION: --Vertebral arteries: Left V4 segment terminates in PICA. Normal right. --Inferior cerebellar arteries: Normal. --Basilar artery: Normal. --Superior cerebellar arteries: Normal. --Posterior cerebral arteries (PCA): Normal. ANTERIOR CIRCULATION: --Intracranial internal carotid arteries: Atherosclerotic calcification of both internal carotid arteries at the skull base with bilateral moderate-to-severe stenosis, worse on the right. --Anterior cerebral arteries (ACA): Normal. Both A1 segments are present. Patent anterior communicating artery (a-comm). --Middle cerebral arteries (MCA): Normal. VENOUS SINUSES: As permitted by contrast timing, patent. ANATOMIC VARIANTS: Small bilateral posterior communicating arteries  present. Review of the MIP images confirms the above findings. IMPRESSION: 1. Early subacute infarct of the right basal ganglia. No intracranial hemorrhage or mass  effect. 2. No emergent large vessel occlusion. 3. 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 Aortic Atherosclerosis (ICD10-I70.0). Electronically Signed   By: Deatra Robinson M.D.   On: 01/16/2021 19:19   CT Cervical Spine Wo Contrast  Result Date: 01/16/2021 CLINICAL DATA:  Altered mental status of unknown cause. EXAM: CT HEAD WITHOUT CONTRAST CT CERVICAL SPINE WITHOUT CONTRAST TECHNIQUE: Multidetector CT imaging of the head and cervical spine was performed following the standard protocol without intravenous contrast. Multiplanar CT image reconstructions of the cervical spine were also generated. COMPARISON:  CT head 03/19/2019.  CT cervical spine 02/02/2018 FINDINGS: CT HEAD FINDINGS Brain: Diffuse cerebral atrophy. Ventricular dilatation likely due to central atrophy. Low-attenuation changes in the deep white matter likely due to small vessel ischemia. Cavum septum pellucidum. No mass-effect or midline shift. No abnormal extra-axial fluid collections. Gray-white matter junctions are distinct. Basal cisterns are not effaced. No acute intracranial hemorrhage. Vascular: Moderate intracranial arterial vascular calcifications. Skull: Calvarium appears intact. No acute displaced fractures identified. 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 Sinuses/Orbits: Paranasal sinuses and mastoid air cells are clear. Other: None. CT CERVICAL SPINE FINDINGS Alignment: Normal alignment of the cervical vertebrae and facet joints. Skull base and vertebrae: Skull base appears intact. No vertebral compression deformities. No focal bone lesion or bone destruction. Degenerative changes noted in the temporomandibular joints bilaterally. Soft tissues and spinal canal: No prevertebral soft tissue swelling. No abnormal paraspinal soft tissue mass or infiltration. Calcifications in  the cervical carotid arteries. Disc levels: Degenerative changes with narrowed interspaces and endplate hypertrophic changes throughout. Mild degenerative changes in the facet joints. Upper chest: Lung apices are clear. Other: None. IMPRESSION: 1. No acute intracranial abnormalities. Chronic atrophy and small vessel ischemic changes. 2. Normal alignment of the cervical spine. Degenerative changes. No acute displaced fractures identified. Electronically Signed   By: Burman Nieves M.D.   On: 01/16/2021 00:25   MR BRAIN WO CONTRAST  Result Date: 01/16/2021 CLINICAL DATA:  Delirium. EXAM: MRI HEAD WITHOUT CONTRAST TECHNIQUE: Multiplanar, multiecho pulse sequences of the brain and surrounding structures were obtained without intravenous contrast. COMPARISON:  Head CT 01/16/2021 and MRI 05/21/2004 FINDINGS: The examination had to be discontinued prior to completion due to patient movement and inability to follow instructions. Axial and coronal diffusion, sagittal T1, and axial T2 sequences were obtained with the latter 2 sequences being severely motion degraded. Brain: There is a large acute right basal ganglia infarct involving essentially the entirety of the putamen and head and body of the caudate. No gross intracranial hemorrhage, mass effect, or extra-axial fluid collection is identified. There is a cavum septum pellucidum et vergae. There is moderate cerebral atrophy. Vascular: Major intracranial vascular flow voids are grossly preserved. Skull and upper cervical spine: Small hemangioma in the right frontal skull, unchanged from 2005. Sinuses/Orbits: Right cataract extraction. Grossly clear paranasal sinuses and mastoid air cells. Other: None. IMPRESSION: 1. Incomplete, severely motion degraded examination. 2. Acute right basal ganglia infarct. Electronically Signed   By: Sebastian Ache M.D.   On: 01/16/2021 12:45   DG Chest Portable 1 View  Result Date: 01/16/2021 CLINICAL DATA:  Altered mental status EXAM:  PORTABLE CHEST 1 VIEW COMPARISON:  December 25, 2019 FINDINGS: The heart  size is stable. Aortic calcifications are noted. There is no pneumothorax or large pleural effusion. No definite focal infiltrate. Incidentally noted are left-sided carotid artery calcifications. IMPRESSION: No active disease. Electronically Signed   By: Katherine Mantle M.D.   On: 01/16/2021 00:06   ECHOCARDIOGRAM COMPLETE  Result Date: 01/16/2021    ECHOCARDIOGRAM REPORT   Patient Name:   Jordi Humann Date of Exam: 01/16/2021 Medical Rec #:  [id number]       Height:       60.0 in Accession #:    [id number]      Weight:       130.0 lb Date of Birth:  11-07-1945       BSA:          1.554 m Patient Age:    75 years        BP:           196/54 mmHg Patient Gender: F               HR:           75 bpm. Exam Location:  Inpatient Procedure: 2D Echo Indications:    stroke  History:        Patient has prior history of Echocardiogram examinations, most                 recent 01/23/2018. Risk Factors:Diabetes and Hypertension.  Sonographer:    Delcie Roch Referring Phys: [id number] BELKYS A REGALADO  Sonographer Comments: Image acquisition challenging due to uncooperative patient. IMPRESSIONS  1. Left ventricular ejection fraction, by estimation, is 70 to 75%. The left ventricle has hyperdynamic function. The left ventricle has no regional wall motion abnormalities. There is moderate asymmetric left ventricular hypertrophy of the basal-septal  segment. Left ventricular diastolic parameters are consistent with Grade I diastolic dysfunction (impaired relaxation).  2. Right ventricular systolic function is normal. The right ventricular size is normal. Tricuspid regurgitation signal is inadequate for assessing PA pressure.  3. The mitral valve is normal in structure. No evidence of mitral valve regurgitation.  4. The aortic valve was not well visualized. Aortic valve regurgitation is not visualized. No aortic stenosis is present.  5. The inferior vena cava  is normal in size with greater than 50% respiratory variability, suggesting right atrial pressure of 3 mmHg. FINDINGS  Left Ventricle: Left ventricular ejection fraction, by estimation, is 70 to 75%. The left ventricle has hyperdynamic function. The left ventricle has no regional wall motion abnormalities. The left ventricular internal cavity size was normal in size. There is moderate asymmetric left ventricular hypertrophy of the basal-septal segment. Left ventricular diastolic parameters are consistent with Grade I diastolic dysfunction (impaired relaxation). Right Ventricle: The right ventricular size is normal. No increase in right ventricular wall thickness. Right ventricular systolic function is normal. Tricuspid regurgitation signal is inadequate for assessing PA pressure. Left Atrium: Left atrial size was normal in size. Right Atrium: Right atrial size was normal in size. Pericardium: There is no evidence of pericardial effusion. Mitral Valve: The mitral valve is normal in structure. No evidence of mitral valve regurgitation. Tricuspid Valve: The tricuspid valve is normal in structure. Tricuspid valve regurgitation is trivial. Aortic Valve: The aortic valve was not well visualized. Aortic valve regurgitation is not visualized. No aortic stenosis is present. Pulmonic Valve: The pulmonic valve was not well visualized. Pulmonic valve regurgitation is not visualized. Aorta: The aortic root is normal in size and structure. Venous: The inferior vena cava is normal in size  with greater than 50% respiratory variability, suggesting right atrial pressure of 3 mmHg. IAS/Shunts: The interatrial septum was not well visualized.  LEFT VENTRICLE PLAX 2D LVIDd:         4.20 cm  Diastology LVIDs:         2.20 cm  LV e' medial:    6.64 cm/s LV PW:         0.90 cm  LV E/e' medial:  16.6 LV IVS:        0.80 cm  LV e' lateral:   10.80 cm/s LVOT diam:     1.80 cm  LV E/e' lateral: 10.2 LV SV:         65 LV SV Index:   42 LVOT  Area:     2.54 cm  RIGHT VENTRICLE RV S prime:     14.30 cm/s TAPSE (M-mode): 2.1 cm LEFT ATRIUM             Index       RIGHT ATRIUM           Index LA diam:        3.20 cm 2.06 cm/m  RA Area:     10.50 cm LA Vol (A2C):   46.6 ml 29.98 ml/m RA Volume:   22.40 ml  14.41 ml/m LA Vol (A4C):   37.5 ml 24.13 ml/m LA Biplane Vol: 42.4 ml 27.28 ml/m  AORTIC VALVE LVOT Vmax:   125.00 cm/s LVOT Vmean:  83.500 cm/s LVOT VTI:    0.257 m  AORTA Ao Root diam: 2.80 cm Ao Asc diam:  3.10 cm MITRAL VALVE MV Area (PHT): 2.87 cm     SHUNTS MV Decel Time: 264 msec     Systemic VTI:  0.26 m MV E velocity: 110.00 cm/s  Systemic Diam: 1.80 cm MV A velocity: 148.00 cm/s MV E/A ratio:  0.74 Epifanio Lesches MD Electronically signed by Epifanio Lesches MD Signature Date/Time: 01/16/2021/10:39:53 PM    Final    DG HIP UNILAT WITH PELVIS 1V LEFT  Result Date: 01/16/2021 CLINICAL DATA:  Altered mental status and questionable fall EXAM: DG HIP (WITH OR WITHOUT PELVIS) 1V*L*; DG HIP (WITH OR WITHOUT PELVIS) 1V RIGHT COMPARISON:  02/02/2018 left femur radiography FINDINGS: Left femoral nail fixation with interval healing of the shaft fracture. No evidence of acute fracture. The visible hardware is intact. No evidence of right hip fracture or subluxation. No evidence of pelvic ring fracture or diastasis. Generalized osteopenia. Symphysis pubis ankylosis. Mild degenerative hip spurring for age. IMPRESSION: No acute finding. Electronically Signed   By: Marnee Spring M.D.   On: 01/16/2021 09:35   DG HIP UNILAT WITH PELVIS 1V RIGHT  Result Date: 01/16/2021 CLINICAL DATA:  Altered mental status and questionable fall EXAM: DG HIP (WITH OR WITHOUT PELVIS) 1V*L*; DG HIP (WITH OR WITHOUT PELVIS) 1V RIGHT COMPARISON:  02/02/2018 left femur radiography FINDINGS: Left femoral nail fixation with interval healing of the shaft fracture. No evidence of acute fracture. The visible hardware is intact. No evidence of right hip fracture or  subluxation. No evidence of pelvic ring fracture or diastasis. Generalized osteopenia. Symphysis pubis ankylosis. Mild degenerative hip spurring for age. IMPRESSION: No acute finding. Electronically Signed   By: Marnee Spring M.D.   On: 01/16/2021 09:35   VAS US CAROTID  Result Date: 01/16/2021 Carotid Arterial Duplex Study Indications:       Carotid calcifications seen on recent imaging. Comparison Study:  No prior studies. Performing Technologist: Jean Rosenthal RDMS,RVT  Examination Guidelines: A complete evaluation includes B-mode imaging, spectral Doppler, color Doppler, and power Doppler as needed of all accessible portions of each vessel. Bilateral testing is considered an integral part of a complete examination. Limited examinations for reoccurring indications may be performed as noted.  Right Carotid Findings: +----------+--------+--------+--------+------------------+--------+           PSV cm/sEDV cm/sStenosisPlaque DescriptionComments +----------+--------+--------+--------+------------------+--------+ CCA Prox  108     17                                         +----------+--------+--------+--------+------------------+--------+ CCA Distal114     20                                         +----------+--------+--------+--------+------------------+--------+ ICA Prox  73      27      1-39%   heterogenous               +----------+--------+--------+--------+------------------+--------+ ICA Distal95      32                                         +----------+--------+--------+--------+------------------+--------+ ECA       140     12                                         +----------+--------+--------+--------+------------------+--------+ +----------+--------+-------+----------------+-------------------+           PSV cm/sEDV cmsDescribe        Arm Pressure (mmHG) +----------+--------+-------+----------------+-------------------+ [id number]             Multiphasic, WNL                    +----------+--------+-------+----------------+-------------------+ +---------+--------+---+--------+--+---------+ VertebralPSV cm/s111EDV cm/s22Antegrade +---------+--------+---+--------+--+---------+  Left Carotid Findings: +----------+--------+--------+--------+--------------------------+--------+           PSV cm/sEDV cm/sStenosisPlaque Description        Comments +----------+--------+--------+--------+--------------------------+--------+ CCA Prox  113     28                                                 +----------+--------+--------+--------+--------------------------+--------+ CCA Distal104     27              calcific                           +----------+--------+--------+--------+--------------------------+--------+ ICA Prox  238     72      60-79%  heterogenous and irregular         +----------+--------+--------+--------+--------------------------+--------+ ICA Mid   171     63                                                 +----------+--------+--------+--------+--------------------------+--------+ ICA Distal140     52                                                 +----------+--------+--------+--------+--------------------------+--------+  ECA       217                                                        +----------+--------+--------+--------+--------------------------+--------+ +----------+--------+--------+----------------+-------------------+           PSV cm/sEDV cm/sDescribe        Arm Pressure (mmHG) +----------+--------+--------+----------------+-------------------+ [id number]             Multiphasic, WNL                    +----------+--------+--------+----------------+-------------------+ +---------+--------+--+--------+--+---------+ VertebralPSV cm/s60EDV cm/s13Antegrade +---------+--------+--+--------+--+---------+   Summary: Right Carotid: Velocities in the right ICA are  consistent with a 1-39% stenosis. Left Carotid: Velocities in the left ICA are consistent with a 60-79% stenosis. Vertebrals:  Bilateral vertebral arteries demonstrate antegrade flow. Subclavians: Normal flow hemodynamics were seen in bilateral subclavian              arteries. *See table(s) above for measurements and observations.  Electronically signed by Waverly Ferrari MD on 01/16/2021 at 3:42:50 PM.    Final      Subjective: Patient was seen and examined today.  Feeling little agitated and wants to go home.  Patient is legally blind and deaf.  Son at bedside.  Stating that she appears at her baseline and always get very agitated if not at her home.  Most likely some underlying dementia.  Discharge Exam: Vitals:   01/17/21 0801 01/17/21 1204  BP: (!) 147/132   Pulse: 80 79  Resp: 16 18  Temp: 98 F (36.7 C) 98.3 F (36.8 C)  SpO2: 100% 100%   Vitals:   01/16/21 2358 01/17/21 0348 01/17/21 0801 01/17/21 1204  BP: (!) 170/88 (!) 117/98 (!) 147/132   Pulse: 74 85 80 79  Resp: 18 17 16 18   Temp: 98.2 F (36.8 C) 98.3 F (36.8 C) 98 F (36.7 C) 98.3 F (36.8 C)  TempSrc: Oral Oral Axillary Oral  SpO2: 100% 100% 100% 100%  Weight:      Height:        General: Pt is alert, awake, not in acute distress Cardiovascular: RRR, S1/S2 +, no rubs, no gallops Respiratory: CTA bilaterally, no wheezing, no rhonchi Abdominal: Soft, NT, ND, bowel sounds + Extremities: no edema, no cyanosis   The results of significant diagnostics from this hospitalization (including imaging, microbiology, ancillary and laboratory) are listed below for reference.    Microbiology: Recent Results (from the past 240 hour(s))  Urine culture     Status: Abnormal   Collection Time: 01/15/21 10:33 PM   Specimen: Urine, Random  Result Value Ref Range Status   Specimen Description URINE, RANDOM  Final   Special Requests NONE  Final   Culture (A)  Final    <10,000 COLONIES/mL INSIGNIFICANT  GROWTH Performed at [facility], [address], [place], [place]    Report Status 01/17/2021 FINAL  Final  Resp Panel by RT-PCR (Flu A&B, Covid) Nasopharyngeal Swab     Status: None   Collection Time: 01/16/21  1:21 AM   Specimen: Nasopharyngeal Swab; Nasopharyngeal(NP) swabs in vial transport medium  Result Value Ref Range Status   SARS Coronavirus 2 by RT PCR NEGATIVE NEGATIVE Final    Comment: (NOTE) SARS-CoV-2 target nucleic acids are NOT DETECTED.  The SARS-CoV-2 RNA is  generally detectable in upper respiratory specimens during the acute phase of infection. The lowest concentration of SARS-CoV-2 viral copies this assay can detect is 138 copies/mL. A negative result does not preclude SARS-Cov-2 infection and should not be used as the sole basis for treatment or other patient management decisions. A negative result may occur with  improper specimen collection/handling, submission of specimen other than nasopharyngeal swab, presence of viral mutation(s) within the areas targeted by this assay, and inadequate number of viral copies(<138 copies/mL). A negative result must be combined with clinical observations, patient history, and epidemiological information. The expected result is Negative.  Fact Sheet for Patients:  BloggerCourse.com  Fact Sheet for Healthcare Providers:  SeriousBroker.it  This test is no t yet approved or cleared by the [place] FDA and  has been authorized for detection and/or diagnosis of SARS-CoV-2 by FDA under an Emergency Use Authorization (EUA). This EUA will remain  in effect (meaning this test can be used) for the duration of the COVID-19 declaration under Section 564(b)(1) of the Act, 21 U.S.C.section 360bbb-3(b)(1), unless the authorization is terminated  or revoked sooner.       Influenza A by PCR NEGATIVE NEGATIVE Final   Influenza B by PCR NEGATIVE NEGATIVE Final     Comment: (NOTE) The Xpert Xpress SARS-CoV-2/FLU/RSV plus assay is intended as an aid in the diagnosis of influenza from Nasopharyngeal swab specimens and should not be used as a sole basis for treatment. Nasal washings and aspirates are unacceptable for Xpert Xpress SARS-CoV-2/FLU/RSV testing.  Fact Sheet for Patients: BloggerCourse.com  Fact Sheet for Healthcare Providers: SeriousBroker.it  This test is not yet approved or cleared by the [place] FDA and has been authorized for detection and/or diagnosis of SARS-CoV-2 by FDA under an Emergency Use Authorization (EUA). This EUA will remain in effect (meaning this test can be used) for the duration of the COVID-19 declaration under Section 564(b)(1) of the Act, 21 U.S.C. section 360bbb-3(b)(1), unless the authorization is terminated or revoked.  Performed at [facility], [address] [place], [place] [address]      Labs: BNP (last 3 results) No results for input(s): BNP in the last 8760 hours. Basic Metabolic Panel: Recent Labs  Lab 01/15/21 2233 01/16/21 1006 01/17/21 0323  NA 141 140 140  K 4.1 3.5 3.7  CL 107 107 106  CO2 GLUCOSE 80 174* 197*  BUN CREATININE 1.12* 1.00 1.05*  CALCIUM 9.5 9.1 9.6   Liver Function Tests: Recent Labs  Lab 01/15/21 2233  AST 31  ALT 14  ALKPHOS 58  BILITOT 0.6  PROT 6.1*  ALBUMIN 3.5   No results for input(s): LIPASE, AMYLASE in the last 168 hours. No results for input(s): AMMONIA in the last 168 hours. CBC: Recent Labs  Lab 01/15/21 2233 01/17/21 0323  WBC 7.6 8.7  NEUTROABS 4.6  --   HGB 12.3 12.6  HCT 38.7 36.9  MCV 80.6 77.5*  PLT 130* 276   Cardiac Enzymes: No results for input(s): CKTOTAL, CKMB, CKMBINDEX, TROPONINI in the last 168 hours. BNP: Invalid input(s): POCBNP CBG: Recent Labs  Lab 01/16/21 1523 01/16/21 1746 01/16/21 2123 01/17/21 0638 01/17/21 1208  GLUCAP  116* 165* 170* 177* 111*   D-Dimer No results for input(s): DDIMER in the last 72 hours. Hgb A1c Recent Labs    01/16/21 1006  HGBA1C 6.3*   Lipid Profile Recent Labs    01/17/21 0323  CHOL 179  HDL 43  LDLCALC 120*  TRIG 79  CHOLHDL 4.2   Thyroid function studies Recent Labs    01/16/21 1006 01/16/21 1612  TSH <0.010*  --   T3FREE  --  2.5   Anemia work up Recent Labs    01/16/21 1006  VITAMINB12 4,607*   Urinalysis    Component Value Date/Time   COLORURINE YELLOW 01/16/2021 0122   APPEARANCEUR CLEAR 01/16/2021 0122   LABSPEC 1.010 01/16/2021 0122   PHURINE 6.0 01/16/2021 0122   GLUCOSEU NEGATIVE 01/16/2021 0122   HGBUR SMALL (A) 01/16/2021 0122   BILIRUBINUR NEGATIVE 01/16/2021 0122   KETONESUR 5 (A) 01/16/2021 0122   PROTEINUR NEGATIVE 01/16/2021 0122   NITRITE NEGATIVE 01/16/2021 0122   LEUKOCYTESUR TRACE (A) 01/16/2021 0122   Sepsis Labs Invalid input(s): PROCALCITONIN,  WBC,  LACTICIDVEN Microbiology Recent Results (from the past 240 hour(s))  Urine culture     Status: Abnormal   Collection Time: 01/15/21 10:33 PM   Specimen: Urine, Random  Result Value Ref Range Status   Specimen Description URINE, RANDOM  Final   Special Requests NONE  Final   Culture (A)  Final    <10,000 COLONIES/mL INSIGNIFICANT GROWTH Performed at [facility], [address] [place], [place] [address]    Report Status 01/17/2021 FINAL  Final  Resp Panel by RT-PCR (Flu A&B, Covid) Nasopharyngeal Swab     Status: None   Collection Time: 01/16/21  1:21 AM   Specimen: Nasopharyngeal Swab; Nasopharyngeal(NP) swabs in vial transport medium  Result Value Ref Range Status   SARS Coronavirus 2 by RT PCR NEGATIVE NEGATIVE Final    Comment: (NOTE) SARS-CoV-2 target nucleic acids are NOT DETECTED.  The SARS-CoV-2 RNA is generally detectable in upper respiratory specimens during the acute phase of infection. The lowest concentration of SARS-CoV-2 viral copies this assay  can detect is 138 copies/mL. A negative result does not preclude SARS-Cov-2 infection and should not be used as the sole basis for treatment or other patient management decisions. A negative result may occur with  improper specimen collection/handling, submission of specimen other than nasopharyngeal swab, presence of viral mutation(s) within the areas targeted by this assay, and inadequate number of viral copies(<138 copies/mL). A negative result must be combined with clinical observations, patient history, and epidemiological information. The expected result is Negative.  Fact Sheet for Patients:  BloggerCourse.com  Fact Sheet for Healthcare Providers:  SeriousBroker.it  This test is no t yet approved or cleared by the [place] FDA and  has been authorized for detection and/or diagnosis of SARS-CoV-2 by FDA under an Emergency Use Authorization (EUA). This EUA will remain  in effect (meaning this test can be used) for the duration of the COVID-19 declaration under Section 564(b)(1) of the Act, 21 U.S.C.section 360bbb-3(b)(1), unless the authorization is terminated  or revoked sooner.       Influenza A by PCR NEGATIVE NEGATIVE Final   Influenza B by PCR NEGATIVE NEGATIVE Final    Comment: (NOTE) The Xpert Xpress SARS-CoV-2/FLU/RSV plus assay is intended as an aid in the diagnosis of influenza from Nasopharyngeal swab specimens and should not be used as a sole basis for treatment. Nasal washings and aspirates are unacceptable for Xpert Xpress SARS-CoV-2/FLU/RSV testing.  Fact Sheet for Patients: BloggerCourse.com  Fact Sheet for Healthcare Providers: SeriousBroker.it  This test is not yet approved or cleared by the [place] FDA and has been authorized for detection and/or diagnosis of SARS-CoV-2 by FDA under an Emergency Use Authorization (  EUA). This EUA will  remain in effect (meaning this test can be used) for the duration of the COVID-19 declaration under Section 564(b)(1) of the Act, 21 U.S.C. section 360bbb-3(b)(1), unless the authorization is terminated or revoked.  Performed at [facility], [address], [place], [place] [address]     Time coordinating discharge: Over 30 minutes  SIGNED:  Arnetha Courser, MD  [facility] 01/17/2021, 3:03 PM  If 7PM-7AM, please contact night-coverage [email or web address]  This record has been created using Conservation officer, historic buildings. Errors have been sought and corrected,but may not always be located. Such creation errors do not reflect on the standard of care.

## 2021-01-17 NOTE — Progress Notes (Signed)
Pt discharged home with two sons via wheelchair. AVS documentation printed and reviewed with family.

## 2021-01-17 NOTE — Procedures (Signed)
Routine EEG  Duration: 25 min  History: 75 year old female with stroke Technique: This is a technically satisfactory eighteen channel recording using the standard 10-20 system of electrode placement. There were no significant technical difficulties  Medications:  Scheduled Meds: .  stroke: mapping our early stages of recovery book   Does not apply Once  . amLODipine  5 mg Oral Q breakfast  . aspirin  300 mg Rectal Daily   Or  . aspirin  325 mg Oral Daily  . enoxaparin (LOVENOX) injection  40 mg Subcutaneous Q24H  . hydrocortisone sod succinate (SOLU-CORTEF) inj  50 mg Intravenous Q8H  . insulin aspart  0-6 Units Subcutaneous TID WC  . levothyroxine  75 mcg Oral Daily  . prednisoLONE acetate  1 drop Both Eyes BID  . rosuvastatin  20 mg Oral Daily  . sodium chloride  1 drop Both Eyes BID   Continuous Infusions: PRN Meds:.acetaminophen **OR** acetaminophen, hydrALAZINE, ondansetron **OR** ondansetron (ZOFRAN) IV  Report: At the onset of the EEG, the patient is awake. The background activity consists of 8-9Hz , persistent posteriorly dominant, moderate amplitude, symmetric, and rhythmic activity that is reactive to eye opening. Anteriorly, it consists of a mixture of low voltage indeterminant activity and 15-20Hz , persistent low amplitude, symmetric and rhythmic activity. Stepwise intermittent photic stimulation and hyperventilation were not done. Drowsiness is characterized by low amplitude mixed frequency activity, roving eye movements, and decreased eye blinking and muscle artifact. There was no focal slowing or epileptiform discharges seen. There were no seizures seen.  There was generalized intermittent rhythmic delta activity.   Impression: This is an abnormal EEG due to rare generalized intermittent rhythmic delta activity most consistent with non-specific encephalopathy.

## 2021-01-17 NOTE — Evaluation (Signed)
Occupational Therapy Evaluation Patient Details Name: [name] MRN: [id number] DOB: 10/27/1945 Today's Date: 01/17/2021    History of Present Illness 75 y.o. female presents to [facility] ED on 01/15/2021 with AMS. Pt lives alone and is independent in ADLs. 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 PMH: Addison's disease, HTN, chorioretinal inflammation of R eye, hypothyroidism, type 2 DM   Clinical Impression   Pt PTA: Pt living alone and very supportive family close by. Pt currently, appears at her functional baseline for ADL and mobility. Pt extremely thrown off by new surroundings versus her home setting without vision and hearing and finally comprehending that she is in a hospital and less resistant to movement and ADL.  Pt ambulating with assist for guidance only from +1 person. Pt able to fix hair and bend to maneuver socks. Pt sit to stands with minguardA. Pt does not require continued OT skilled services acutely, but could benefit in home setting for safety. OT signing off. Thank you.     Follow Up Recommendations  Home health OT;Supervision/Assistance - 24 hour (initially and HHOT for safe home set-up)    Equipment Recommendations  None recommended by OT    Recommendations for Other Services       Precautions / Restrictions Precautions Precautions: Fall Precaution Comments: blind, deaf Restrictions Weight Bearing Restrictions: No      Mobility Bed Mobility Overal bed mobility: Needs Assistance Bed Mobility: Supine to Sit     Supine to sit: Supervision     General bed mobility comments: supervision for safety    Transfers Overall transfer level: Needs assistance Equipment used: 1 person hand held assist Transfers: Sit to/from Stand Sit to Stand: Min guard         General transfer comment: min guard for safety and guiding through room    Balance Overall balance assessment: Mild deficits observed, not  formally tested                                         ADL either performed or assessed with clinical judgement   ADL Overall ADL's : At baseline                                       General ADL Comments: Pt appears at her functional baseline for ADL and mobility. Pt extremely thrown off by new surroundings and finally comprehending that she is in a hospital and less resistant to movement and ADL.     Vision Baseline Vision/History: Legally blind Patient Visual Report: Central vision impairment;Peripheral vision impairment Vision Assessment?: Vision impaired- to be further tested in functional context Additional Comments: Pt unable to see hardly anything     Perception     Praxis      Pertinent Vitals/Pain Pain Assessment: Faces Faces Pain Scale: No hurt Pain Intervention(s): Monitored during session     Hand Dominance Right   Extremity/Trunk Assessment Upper Extremity Assessment Upper Extremity Assessment: Overall WFL for tasks assessed;RUE deficits/detail;LUE deficits/detail RUE Deficits / Details: slightly resistive to touch, but FF >130* AROM, WFLs LUE Deficits / Details: slightly resistive to touch ~90* FF   Lower Extremity Assessment Lower Extremity Assessment: Overall WFL for tasks assessed   Cervical / Trunk Assessment Cervical / Trunk  Assessment: Kyphotic   Communication Communication Communication: Deaf   Cognition Arousal/Alertness: Awake/alert Behavior During Therapy: WFL for tasks assessed/performed Overall Cognitive Status: Difficult to assess                                 General Comments: Confused due to unfamiliar environment and inability to hear or see. Requiring twisting of head for "no" and nodding head manually for  "yes"   General Comments  Son in room guiding patient stating "this is closer to her baseline. she will be much  more comfortable at her own home where she does everything for  herself."    Exercises     Shoulder Instructions      Home Living Family/patient expects to be discharged to:: Private residence Living Arrangements: Alone Available Help at Discharge: Family;Available 24 hours/day Type of Home: Apartment Home Access: Stairs to enter Entrance Stairs-Number of Steps: 13   Home Layout: One level     Bathroom Shower/Tub: Chief Strategy Officer: Standard     Home Equipment: Environmental consultant - 2 wheels          Prior Functioning/Environment Level of Independence: Independent with assistive device(s)        Comments: used RW occasionally with ambulation in the house. Does not leave house unless with someone. Performs ADLs and iADLs independently.        OT Problem List: Impaired vision/perception      OT Treatment/Interventions:      OT Goals(Current goals can be found in the care plan section) Acute Rehab OT Goals Patient Stated Goal: Son states he wants her to go home OT Goal Formulation: With patient/family Potential to Achieve Goals: Good  OT Frequency:     Barriers to D/C:            Co-evaluation              AM-PAC OT "6 Clicks" Daily Activity     Outcome Measure Help from another person eating meals?: A Little Help from another person taking care of personal grooming?: A Little Help from another person toileting, which includes using toliet, bedpan, or urinal?: A Little Help from another person bathing (including washing, rinsing, drying)?: A Little Help from another person to put on and taking off regular upper body clothing?: A Little Help from another person to put on and taking off regular lower body clothing?: A Little 6 Click Score: 18   End of Session Equipment Utilized During Treatment: Gait belt Nurse Communication: Mobility status  Activity Tolerance: Patient tolerated treatment well Patient left: in bed;with call bell/phone within reach;with nursing/sitter in room;with bed alarm set  OT Visit  Diagnosis: Unsteadiness on feet (R26.81)                Time: 7867-6720 OT Time Calculation (min): 25 min Charges:  OT General Charges $OT Visit: 1 Visit OT Evaluation $OT Eval Moderate Complexity: 1 Mod  Flora Lipps, OTR/L Acute Rehabilitation Services Pager: [phone] Office: [phone]   Alyse Kathan C 01/17/2021, 5:25 PM

## 2021-01-17 NOTE — Evaluation (Signed)
Physical Therapy Evaluation Patient Details Name: [name] MRN: [id number] DOB: 06-Jan-1946 Today's Date: 01/17/2021   History of Present Illness  75 y.o. female presents to [facility] ED on 01/15/2021 with AMS. Pt lives alone and is independent in ADLs. 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 PMH: Addison's disease, HTN, chorioretinal inflammation of R eye, hypothyroidism, type 2 DM  Clinical Impression  PTA, patient lives alone and is independent with mobility with occasional use of RW. Patient overall min guard for OOB mobility with and without RW as patient needs guidance in unfamiliar environment. Son present to assist with communication as patient is deaf and blind. Patient presents with decreased activity tolerance, impaired balance, and generalized weakness. Patient will benefit from skilled PT services during acute stay to address listed deficits. Recommend HHPT and 24 hour supervision and assistance at discharge to maximize functional independence and safety in the home.     Follow Up Recommendations Home health PT;Supervision/Assistance - 24 hour    Equipment Recommendations  None recommended by PT    Recommendations for Other Services       Precautions / Restrictions Precautions Precautions: Fall Precaution Comments: blind, deaf Restrictions Weight Bearing Restrictions: No      Mobility  Bed Mobility Overal bed mobility: Needs Assistance Bed Mobility: Supine to Sit     Supine to sit: Supervision     General bed mobility comments: supervision for safety    Transfers Overall transfer level: Needs assistance Equipment used: 1 person hand held assist Transfers: Sit to/from Stand Sit to Stand: Min guard         General transfer comment: min guard for safety and guiding  Ambulation/Gait Ambulation/Gait assistance: Min guard Gait Distance (Feet): 20 Feet Assistive device: 1 person hand held assist;Rolling  [name] (2 wheeled) Gait Pattern/deviations: Step-to pattern;Decreased stride length;Narrow base of support Gait velocity: decreased   General Gait Details: min guard for safety and guiding. 44' with HHA and 10' with RW, similar gait pattern with each.  Stairs            Wheelchair Mobility    Modified Rankin (Stroke Patients Only) Modified Rankin (Stroke Patients Only) Pre-Morbid Rankin Score: No symptoms Modified Rankin: Moderately severe disability     Balance Overall balance assessment: Mild deficits observed, not formally tested                                           Pertinent Vitals/Pain Pain Assessment: Faces Faces Pain Scale: No hurt Pain Intervention(s): Monitored during session    Home Living Family/patient expects to be discharged to:: Private residence Living Arrangements: Alone Available Help at Discharge: Family;Available 24 hours/day Type of Home: Apartment Home Access: Stairs to enter   Entrance Stairs-Number of Steps: 13 Home Layout: One level Home Equipment: [name] - 2 wheels      Prior Function Level of Independence: Independent with assistive device(s)         Comments: used RW occasionally with ambulation in the house. Does not leave house unless with someone. Performs ADLs and iADLs independently.     Hand Dominance        Extremity/Trunk Assessment   Upper Extremity Assessment Upper Extremity Assessment: Defer to OT evaluation    Lower Extremity Assessment Lower Extremity Assessment: Overall WFL for tasks assessed  Communication   Communication: Deaf  Cognition Arousal/Alertness: Awake/alert Behavior During Therapy: WFL for tasks assessed/performed Overall Cognitive Status: Difficult to assess                                 General Comments: Confused due to unfamiliar environment and inability to hear or see.      General Comments      Exercises     Assessment/Plan     PT Assessment Patient needs continued PT services  PT Problem List Decreased strength;Decreased activity tolerance;Decreased balance;Decreased mobility;Decreased knowledge of precautions       PT Treatment Interventions DME instruction;Gait training;Stair training;Functional mobility training;Therapeutic activities;Therapeutic exercise;Balance training;Patient/family education;Neuromuscular re-education    PT Goals (Current goals can be found in the Care Plan section)  Acute Rehab PT Goals Patient Stated Goal: Son states he wants her to go home PT Goal Formulation: With family Time For Goal Achievement: 01/31/21 Potential to Achieve Goals: Good    Frequency Min 4X/week   Barriers to discharge        Co-evaluation               AM-PAC PT "6 Clicks" Mobility  Outcome Measure Help needed turning from your back to your side while in a flat bed without using bedrails?: A Little Help needed moving from lying on your back to sitting on the side of a flat bed without using bedrails?: A Little Help needed moving to and from a bed to a chair (including a wheelchair)?: A Little Help needed standing up from a chair using your arms (e.g., wheelchair or bedside chair)?: A Little Help needed to walk in hospital room?: A Little Help needed climbing 3-5 steps with a railing? : A Little 6 Click Score: 18    End of Session   Activity Tolerance: Patient tolerated treatment well Patient left: in bed;with call bell/phone within reach;with bed alarm set;with family/visitor present (sitting EOB) Nurse Communication: Mobility status PT Visit Diagnosis: Unsteadiness on feet (R26.81);Muscle weakness (generalized) (M62.81);Difficulty in walking, not elsewhere classified (R26.2)    Time: 7096-2836 PT Time Calculation (min) (ACUTE ONLY): 27 min   Charges:   PT Evaluation $PT Eval Moderate Complexity: 1 Mod          [name] A. [name] PT, DPT Acute Rehabilitation Services Pager  [phone] Office [phone]   [name] 01/17/2021, 1:31 PM

## 2021-01-17 NOTE — TOC Transition Note (Signed)
Transition of Care [facility]) - CM/SW Discharge Note   Patient Details  Name: [name] MRN: [id number] Date of Birth: [birth year]  [facility] CM/SW Contact:  Lawerance Sabal, RN Phone Number: 01/17/2021, 3:39 PM   Clinical Narrative:    Sherron Monday w patient and son at bedside. Deferred to son Sidonie Dickens. [facility], he is agreeable to [facility] services, would like [facility], as she had them in 2019. Referral accepted by [place]. Sidonie Dickens will provide transport home.    Final next level of care: Home w Home Health Services Barriers to Discharge: No Barriers Identified   Patient Goals and CMS Choice Patient states their goals for this hospitalization and ongoing recovery are:: to go home   Choice offered to / list presented to : Adult Children  Discharge Placement                       Discharge Plan and Services                DME Arranged: N/A         HH Arranged: PT,OT,RN,Nurse's Aide HH Agency: [facility] Date HH Agency Contacted: 01/17/21 Time HH Agency Contacted: [phone] Representative spoke with at [facility] Agency: [place]  Social Determinants of Health (SDOH) Interventions     Readmission Risk Interventions No flowsheet data found.

## 2021-01-17 NOTE — Progress Notes (Addendum)
MRI scan of the brain shows a fairly large right basal ganglia infarct and CT angiogram shows no large vessel stenosis or occlusion.  STROKE TEAM PROGRESS NOTE    Interval History   No acute events overnight, RN at bedside who reports that the patient has been confused and agitated. 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  MRI scan of the brain shows a large basal ganglia infarct and CT angiogram shows no large vessel stenosis or occlusion.  Echocardiogram is unremarkable.  There is no known history of atrial fibrillation is unclear if she is a good long-term anticoagulation candidate given her poor baseline vision and mental status and living alone. Pertinent Lab Work and Imaging    01/16/21 CT Head WO Contrast  1. No acute intracranial abnormalities. Chronic atrophy and small vessel ischemic changes. 2. Normal alignment of the cervical spine. Degenerative changes. No acute displaced fractures identified.  01/16/21 CT Angio Head and Neck W WO IV Contrast 1. Early subacute infarct of the right basal ganglia. No intracranial hemorrhage or mass effect. 2. No emergent large vessel occlusion. 3. 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  01/16/21 MRI Brain WO IV Contrast 1. Incomplete, severely motion degraded examination. 2. Acute right basal ganglia infarct.  01/16/21 Echocardiogram Complete  1. Left ventricular ejection fraction, by estimation, is 70 to 75%. The left ventricle has hyperdynamic function. The left ventricle has no regional wall motion abnormalities. There is moderate asymmetric left ventricular hypertrophy of the basal-septal segment. Left ventricular diastolic parameters are consistent with Grade I diastolic dysfunction (impaired relaxation).  2. Right ventricular systolic function is normal. The right ventricular size is normal. Tricuspid regurgitation signal is  inadequate for assessing  PA pressure.  3. The mitral valve is normal in structure. No evidence of mitral valve regurgitation.  4. The aortic valve was not well visualized. Aortic valve regurgitation is not visualized. No aortic stenosis is present.  5. The inferior vena cava is normal in size with greater than 50% respiratory variability, suggesting right atrial pressure of 3 mmHg.   Physical Examination   Constitutional: Agitated, talking sporadically  Cardiovascular: Normal RR Respiratory: No increased WOB   Mental status: Oriented to self and location only. Does not follow commands- she is HOH and blind which limits her examination she has poor insight into her condition.  Speech is fluent but off-topic and will not answer questions appropriately diminished attention, registration and recall Speech: Fluent perseverating speech  Cranial nerves: EOMIS appear intact, does not blink to threat on either side.  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  BTT present on right, not on left, mild left facial weakness  Motor: Normal bulk and tone. Unable to do a full strength exam due to patient comprehensions/visual and hearing deficits. She appears to be weaker to the LUE and LLE. When examiner lefts left arm it drifts. Does not cooperate with lifting left leg. Moves RUE/RLE spontaneously with ease.  Sensory: UTA  Coordination: UTA  Gait: Deferred   Assessment and Plan   [name] is a 75 y.o. female w/pmh of adrenal insufficiency, corrected no inflammation of the right eye, hypertension, syphilis, thyroid disease, hypothyroidism, type II DM, severe hearing moderate visual impairment who presents with AMS, found to have a R BG stroke.   #R BG Stroke  Patient presented with the symptoms described above. CTH w/NAICP. Her MRI Brain revealed  a moderate sized R BG stroke. Echocardiogram w/EF 70 to 75 %, LA size normal. Stroke labs w/LDL 120, A1C 6.3. CTA Head and Neck did not show significant  stenosis. Stroke etiology is cryptogenic however are considering a cardioembolic etiology given its larger size in the BG territory. Are considering cardiac event monitor for further work up pending discussion with family regarding their level of support. She is a fall risk given her blindness and the utility in ordering a cardiac event monitor would be to start her on anticoagulation. However if her fall risk is elevated due to lack of family support would defer on initiating anticoagulation thus no need for cardiac event monitor.  - Continue Aspirin 81 mg for secondary stroke prevention  - Initiate Atorvastatin 40 mg for secondary stroke prevention ( dose of 40 chosen given LDL is not significantly elevated)  - Possible cardiac event monitor at discharge - Follow up in stroke clinic at discharge   #Hypertension Recommend permissive hypertension for the first 24-48 hours post stroke. After that recommend to slowly lower blood pressure avoiding acute drops.   #Hyperlipidemia From a stroke prevention stand point, the LDL goal is < 70. Her LDL is 120. Statin therapy initiated with Atorvastatin 40 mg this admission.   #Diabetes type II  Hemoglobin A1C this admission noted to be 6.3, at goal from a stroke prevention stand point.   Hospital day # 1  Stark Jock, NP  [facility] Nurse Practitioner Patient seen and discussed with attending physician Dr. Pearlean Brownie  I have personally obtained history,examined this patient, reviewed notes, independently viewed imaging studies, participated in medical decision making and plan of care.ROS completed by me personally and pertinent positives fully documented  I have made any additions or clarifications directly to the above note. Agree with note above.  She presented with a large right basal ganglia infarct etiology likely cryptogenic with strong suspicion for paroxysmal A. fib.  Patient however lives alone, is legally blind and hard of hearing and  hence may be a fall risk with I suspect having some underlying cognitive impairment as well and hence may not be the best candidate for long-term anticoagulation.  We will need to decide what kind of family support she has before making decision whether she needs long-term cardiac monitoring or not.  Recommend aspirin Plavix for 3 weeks from now followed by aspirin alone and aggressive risk factor modification.  Greater than 50% time during this 35-minute visit was spent in counseling and coordination of care about her stroke and discussion with care team and answering questions. Delia Heady, MD Medical Director [facility] Pager: [phone] 01/17/2021 6:04 PM   To contact Stroke Continuity provider, please refer to [email or web address]. After hours, contact General Neurology

## 2021-01-17 NOTE — Progress Notes (Signed)
EEG complete - results pending 

## 2021-01-17 NOTE — Progress Notes (Signed)
Will arrange outpt 30 day event monitor as outpt

## 2021-01-17 NOTE — Plan of Care (Signed)
  Problem: Education: Goal: Knowledge of disease or condition will improve Outcome: Adequate for Discharge Goal: Knowledge of secondary prevention will improve Outcome: Adequate for Discharge Goal: Knowledge of patient specific risk factors addressed and post discharge goals established will improve Outcome: Adequate for Discharge   Problem: Health Behavior/Discharge Planning: Goal: Ability to manage health-related needs will improve Outcome: Adequate for Discharge   Problem: Clinical Measurements: Goal: Ability to maintain clinical measurements within normal limits will improve Outcome: Adequate for Discharge Goal: Will remain free from infection Outcome: Adequate for Discharge Goal: Diagnostic test results will improve Outcome: Adequate for Discharge Goal: Respiratory complications will improve Outcome: Adequate for Discharge Goal: Cardiovascular complication will be avoided Outcome: Adequate for Discharge   Problem: Activity: Goal: Risk for activity intolerance will decrease Outcome: Adequate for Discharge   Problem: Nutrition: Goal: Adequate nutrition will be maintained Outcome: Adequate for Discharge   Problem: Elimination: Goal: Will not experience complications related to bowel motility Outcome: Adequate for Discharge Goal: Will not experience complications related to urinary retention Outcome: Adequate for Discharge   Problem: Pain Managment: Goal: General experience of comfort will improve Outcome: Adequate for Discharge   Problem: Safety: Goal: Ability to remain free from injury will improve Outcome: Adequate for Discharge   Problem: Skin Integrity: Goal: Risk for impaired skin integrity will decrease Outcome: Adequate for Discharge

## 2021-01-19 ENCOUNTER — Encounter: Payer: Self-pay | Admitting: *Deleted

## 2021-01-19 NOTE — Progress Notes (Signed)
Patient ID: [name], female   DOB: 1946-04-27, 75 y.o.   MRN: [id number] Patient enrolled for [employer] to ship a 30 day Cardiac event monitor to her home. Letter with instructions mailed to patient.

## 2021-01-29 ENCOUNTER — Telehealth: Payer: Self-pay | Admitting: Cardiology

## 2021-01-29 DIAGNOSIS — I639 Cerebral infarction, unspecified: Secondary | ICD-10-CM

## 2021-01-29 NOTE — Telephone Encounter (Signed)
RN returned call to patients son who states since his mothers stroke she is unable to communicate and had difficulty understanding and processing information. Son states that he does not think that there is anyway the patient will be able to wear the monitor due to not understanding and not leaving the monitor in place. Son is requesting an alternative to the monitor if possible. RN explained that Dr. Mayford Knife and her nurse are out of the office today, but I would send a message for recommendations. Son verbalized understanding.

## 2021-01-29 NOTE — Telephone Encounter (Signed)
Patient's son called in to see if there is another way his mother heart can be monitor. Patient is having a hard time understanding how it is suppose to work. Please call patient son Ines Bloomer [phone]

## 2021-01-30 NOTE — Telephone Encounter (Signed)
Spoke with the patient's son and they are already scheduled for a consult with Dr. Elberta Fortis on 5/12

## 2021-02-08 ENCOUNTER — Encounter (HOSPITAL_COMMUNITY): Payer: Self-pay | Admitting: *Deleted

## 2021-02-08 ENCOUNTER — Emergency Department (HOSPITAL_COMMUNITY): Payer: Medicare Other

## 2021-02-08 ENCOUNTER — Other Ambulatory Visit: Payer: Self-pay

## 2021-02-08 ENCOUNTER — Emergency Department (HOSPITAL_COMMUNITY)
Admission: EM | Admit: 2021-02-08 | Discharge: 2021-02-08 | Disposition: A | Discharge status: Home / Self Care | Payer: Medicare Other | Attending: Emergency Medicine | Admitting: Emergency Medicine

## 2021-02-08 DIAGNOSIS — Z7982 Long term (current) use of aspirin: Secondary | ICD-10-CM | POA: Diagnosis not present

## 2021-02-08 DIAGNOSIS — E039 Hypothyroidism, unspecified: Secondary | ICD-10-CM | POA: Insufficient documentation

## 2021-02-08 DIAGNOSIS — Z79899 Other long term (current) drug therapy: Secondary | ICD-10-CM | POA: Insufficient documentation

## 2021-02-08 DIAGNOSIS — I10 Essential (primary) hypertension: Secondary | ICD-10-CM | POA: Insufficient documentation

## 2021-02-08 DIAGNOSIS — Z87891 Personal history of nicotine dependence: Secondary | ICD-10-CM | POA: Insufficient documentation

## 2021-02-08 DIAGNOSIS — R55 Syncope and collapse: Secondary | ICD-10-CM | POA: Diagnosis present

## 2021-02-08 LAB — CBC WITH DIFFERENTIAL/PLATELET
Abs Immature Granulocytes: 0.02 10*3/uL (ref 0.00–0.07)
Basophils Absolute: 0.1 10*3/uL (ref 0.0–0.1)
Basophils Relative: 1 %
Eosinophils Absolute: 0.2 10*3/uL (ref 0.0–0.5)
Eosinophils Relative: 2 %
HCT: 37.5 % (ref 36.0–46.0)
Hemoglobin: 11.8 g/dL — ABNORMAL LOW (ref 12.0–15.0)
Immature Granulocytes: 0 %
Lymphocytes Relative: 30 %
Lymphs Abs: 2.4 10*3/uL (ref 0.7–4.0)
MCH: 25.4 pg — ABNORMAL LOW (ref 26.0–34.0)
MCHC: 31.5 g/dL (ref 30.0–36.0)
MCV: 80.8 fL (ref 80.0–100.0)
Monocytes Absolute: 0.6 10*3/uL (ref 0.1–1.0)
Monocytes Relative: 8 %
Neutro Abs: 4.6 10*3/uL (ref 1.7–7.7)
Neutrophils Relative %: 59 %
Platelets: 212 10*3/uL (ref 150–400)
RBC: 4.64 MIL/uL (ref 3.87–5.11)
RDW: 14.2 % (ref 11.5–15.5)
WBC: 7.9 10*3/uL (ref 4.0–10.5)
nRBC: 0 % (ref 0.0–0.2)

## 2021-02-08 LAB — CBG MONITORING, ED: Glucose-Capillary: 75 mg/dL (ref 70–99)

## 2021-02-08 LAB — COMPREHENSIVE METABOLIC PANEL
ALT: 24 U/L (ref 0–44)
AST: 27 U/L (ref 15–41)
Albumin: 3.1 g/dL — ABNORMAL LOW (ref 3.5–5.0)
Alkaline Phosphatase: 56 U/L (ref 38–126)
Anion gap: 7 (ref 5–15)
BUN: 13 mg/dL (ref 8–23)
CO2: 26 mmol/L (ref 22–32)
Calcium: 9.1 mg/dL (ref 8.9–10.3)
Chloride: 110 mmol/L (ref 98–111)
Creatinine, Ser: 1.16 mg/dL — ABNORMAL HIGH (ref 0.44–1.00)
GFR, Estimated: 49 mL/min — ABNORMAL LOW (ref >60)
Glucose, Bld: 94 mg/dL (ref 70–99)
Potassium: 3.6 mmol/L (ref 3.5–5.1)
Sodium: 143 mmol/L (ref 135–145)
Total Bilirubin: 0.5 mg/dL (ref 0.3–1.2)
Total Protein: 5.9 g/dL — ABNORMAL LOW (ref 6.5–8.1)

## 2021-02-08 LAB — AMMONIA: Ammonia: 22 umol/L (ref 9–35)

## 2021-02-08 LAB — URINALYSIS, ROUTINE W REFLEX MICROSCOPIC
Bacteria, UA: NONE SEEN
Bilirubin Urine: NEGATIVE
Glucose, UA: NEGATIVE mg/dL
Hgb urine dipstick: NEGATIVE
Ketones, ur: NEGATIVE mg/dL
Nitrite: NEGATIVE
Protein, ur: NEGATIVE mg/dL
Specific Gravity, Urine: 1.008 (ref 1.005–1.030)
pH: 6 (ref 5.0–8.0)

## 2021-02-08 LAB — TSH: TSH: <0.01 u[IU]/mL — ABNORMAL LOW (ref 0.350–4.500)

## 2021-02-08 MED ORDER — HYDROCORTISONE 10 MG PO TABS
10.0000 mg | ORAL_TABLET | Freq: Every day | ORAL | Status: DC
  Administered 2021-02-08: 10 mg via ORAL
  Filled 2021-02-08: qty 1

## 2021-02-08 MED ORDER — SODIUM CHLORIDE 0.9 % IV BOLUS
500.0000 mL | Freq: Once | INTRAVENOUS | Status: AC
Start: 2021-02-08 — End: 2021-02-08
  Administered 2021-02-08: 500 mL via INTRAVENOUS

## 2021-02-08 NOTE — ED Provider Notes (Signed)
[facility] EMERGENCY DEPARTMENT Provider Note   CSN: [id number] Arrival date & time: 02/08/21  1434     History Chief Complaint  Patient presents with  . Loss of Consciousness    [name] is a 75 y.o. female with a history of adrenal insufficiency on hydrocortisone daily, hypertension, type 2 diabetes, cerebral thrombosis with cerebral infarct, hypothyroidism.  Patient is deaf and blind.  Family members communicate with her via finger taps.  Patient's son at bedside.  Patient's son reports history.  States that he came to his mother's house today at approximately 1300.  Patient was active and responding appropriately.  Patient had no slurred speech, facial symmetry or one-sided weakness.  Patient reported that she did not eat any breakfast this morning or take any of her morning medications.  Patient's son made her meal and gave her dose of Synthyroid.  Patient was able to eat without difficulties.  After completing her meal patient went unresponsive while sitting down.  Head tilted back and patient remained unresponsive for "a few minutes."  Patient regained consciousness and was lethargic per son.  Patient's son denies any tremors, seizures, falls focal neurological deficit, vomiting.  Patient son reports that earlier in the day patient complained of generalized weakness to bilateral arms and legs.  Triage note indicates that patient was hypotensive upon EMS arrival.  She received 500 L normal saline while in route with improvement in her blood pressure.  HPI     Past Medical History:  Diagnosis Date  . Adrenal insufficiency (Addison's disease) (HCC)   . 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 Hypertension   . Syphilis 01/28/2020  . Thyroid disease    hypothyroidism  . Type 2 diabetes mellitus [facility])     Patient Active Problem List   Diagnosis Date Noted  . Cerebral thrombosis with cerebral infarction 01/17/2021  . Acute metabolic  encephalopathy 01/16/2021  . Hypertension   . Elevated growth hormone level 03/11/2020  . 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 Syphilis 01/28/2020  . 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 Anemia 02/02/2018  . Thrombocytopenia (HCC) 02/02/2018  . Hypokalemia 02/02/2018  . Hypomagnesemia 02/02/2018  . Femur fracture, left (HCC) 02/02/2018  . Adrenal insufficiency (HCC) 02/02/2018  . Hypothyroidism 02/02/2018  . Type II diabetes mellitus (HCC) 02/02/2018  . Elevated troponin 02/02/2018    Past Surgical History:  Procedure Laterality Date  . EYE SURGERY    . FEMUR IM NAIL Left 02/02/2018  . FEMUR IM NAIL Left 02/02/2018   Procedure: INTRAMEDULLARY (IM) NAIL LEFT FEMUR;  Surgeon: Roby Lofts, MD;  Location: [facility] OR;  Service: Orthopedics;  Laterality: Left;     OB History   No obstetric history on file.     Family History  Problem Relation Age of Onset  . Vitiligo Son     Social History   Tobacco Use  . Smoking status: Former Games developer  . Smokeless tobacco: Never Used  Vaping Use  . Vaping Use: Never used  Substance Use Topics  . Alcohol use: Never  . Drug use: Never    Home Medications Prior to Admission medications   Medication Sig Start Date End Date Taking? Authorizing Provider  amLODipine (NORVASC) 5 MG tablet Take 1 tablet (5 mg total) by mouth daily with breakfast. 01/18/21   Arnetha Courser, MD  aspirin EC 81 MG tablet Take 1 tablet (81 mg total) by mouth daily. Swallow whole. 01/17/21 01/17/22  Amin,  Tilman Neat, MD  hydrocortisone (CORTEF) 10 MG tablet Take 1 tablet (10 mg total) by mouth in the morning AND 0.5 tablets (5 mg total) daily in the afternoon. 02/14/20   Shamleffer, Konrad Dolores, MD  levothyroxine (SYNTHROID) 75 MCG tablet Take 1 tablet (75 mcg total) by mouth daily. 05/17/20   Shamleffer, Konrad Dolores, MD  Multiple Vitamins-Minerals (CENTRUM SILVER 50+WOMEN) TABS Take 1 tablet by mouth daily.     [provider]  polyethylene glycol (MIRALAX / GLYCOLAX) packet Take 17 g by mouth 2 (two) times daily. (until having soft bowel movement daily, then use as needed or daily) 02/06/18   Zigmund Daniel., MD  prednisoLONE acetate (PRED FORTE) 1 % ophthalmic suspension INSTILL 1 DROP INTO EACH EYE TWICE DAILY 08/03/19   Doristine Bosworth, MD  Propylene Glycol (SYSTANE BALANCE OP) Apply 2 drops to eye 3 (three) times daily as needed (dry eyes).     [provider]  rosuvastatin (CRESTOR) 20 MG tablet Take 1 tablet (20 mg total) by mouth daily. 01/18/21   Arnetha Courser, MD  sodium chloride (MURO 128) 2 % ophthalmic solution Place 1 drop into both eyes 2 (two) times daily. 04/18/18   Doristine Bosworth, MD    Allergies    Patient has no known allergies.  Review of Systems   Review of Systems  Unable to perform ROS: Other    Physical Exam Updated Vital Signs BP (!) 146/46 (BP Location: Right Arm)   Pulse (!) 53   Temp (!) 97.4 F (36.3 C) (Oral)   Resp 12   Ht 5' (1.524 m)   Wt 59 kg   SpO2 97%   BMI 25.40 kg/m   Physical Exam Vitals and nursing note reviewed.  Constitutional:      General: She is not in acute distress.    Appearance: She is not ill-appearing, toxic-appearing or diaphoretic.  HENT:     Head: Normocephalic and atraumatic. No raccoon eyes, Battle's sign, abrasion, contusion, masses, right periorbital erythema, left periorbital erythema or laceration.  Eyes:     General: No scleral icterus.       Right eye: No discharge.        Left eye: No discharge.  Cardiovascular:     Rate and Rhythm: Normal rate.     Heart sounds: Normal heart sounds.  Pulmonary:     Effort: Pulmonary effort is normal. No tachypnea, bradypnea or respiratory distress.     Breath sounds: Normal breath sounds. No stridor.  Abdominal:     General: Abdomen is flat. There is no distension. There are no signs of injury.     Palpations: Abdomen is soft. There is no mass or  pulsatile mass.     Tenderness: There is no abdominal tenderness. There is no guarding or rebound.     Hernia: There is no hernia in the umbilical area or ventral area.  Musculoskeletal:     Cervical back: Neck supple.     Right lower leg: No swelling or tenderness. No edema.     Left lower leg: No swelling or tenderness. No edema.     Comments: No midline tenderness or deformity to cervical, thoracic, or lumbar spine  No tenderness or injury to patient's back  Skin:    General: Skin is warm and dry.     Coloration: Skin is not jaundiced or pale.  Neurological:     General: No focal deficit present.     Mental Status: She is alert.  GCS: GCS eye subscore is 3. GCS verbal subscore is 5. GCS motor subscore is 6.     Cranial Nerves: No facial asymmetry.     Motor: No pronator drift.     Comments: Neurological assessment hindered by patient's deafness and blindness.  No facial asymmetry noted.  Equal grip strength.  Patient able to lift both legs and hold against gravity without difficulty.   +5 strength to dorsiflexion and plantar flexion.  +5 strength to bilateral upper extremities.  Psychiatric:        Behavior: Behavior is cooperative.     ED Results / Procedures / Treatments   Labs (all labs ordered are listed, but only abnormal results are displayed) Labs Reviewed  CBC WITH DIFFERENTIAL/PLATELET - Abnormal; Notable for the following components:      Result Value   Hemoglobin 11.8 (*)    MCH 25.4 (*)    All other components within normal limits  COMPREHENSIVE METABOLIC PANEL - Abnormal; Notable for the following components:   Creatinine, Ser 1.16 (*)    Total Protein 5.9 (*)    Albumin 3.1 (*)    GFR, Estimated 49 (*)    All other components within normal limits  URINALYSIS, ROUTINE W REFLEX MICROSCOPIC - Abnormal; Notable for the following components:   Color, Urine STRAW (*)    Leukocytes,Ua TRACE (*)    All other components within normal limits  TSH - Abnormal;  Notable for the following components:   TSH <0.010 (*)    All other components within normal limits  AMMONIA  CBG MONITORING, ED    EKG EKG Interpretation  Date/Time:  Sunday Feb 08 2021 14:41:36 EDT Ventricular Rate:  52 PR Interval:  154 QRS Duration: 95 QT Interval:  457 QTC Calculation: 425 R Axis:   51 Text Interpretation: Sinus rhythm No significant change since last tracing Confirmed by Benjiman Core [phone]) on 02/08/2021 3:22:33 PM   Radiology DG Chest 2 View  Result Date: 02/08/2021 CLINICAL DATA:  Loss of consciousness EXAM: CHEST - 2 VIEW COMPARISON:  Chest x-rays dated 01/15/2021 and 12/25/2019 FINDINGS: Heart size and mediastinal contours are within normal limits. Lungs are clear. No pleural effusion or pneumothorax is seen. Osseous structures about the chest are unremarkable. IMPRESSION: No active cardiopulmonary disease. Electronically Signed   By: Bary Richard M.D.   On: 02/08/2021 16:20   CT Head Wo Contrast  Result Date: 02/08/2021 CLINICAL DATA:  Mental status change EXAM: CT HEAD WITHOUT CONTRAST TECHNIQUE: Contiguous axial images were obtained from the base of the skull through the vertex without intravenous contrast. COMPARISON:  01/16/2021 FINDINGS: Motion artifact is present. Below findings are within this limitation. Brain: There is no mass effect or edema. No definite new loss of gray-white differentiation. Right basal ganglia infarct on recent CT and MR imaging is not as well seen, which may reflect subacute chronicity and petechial hemorrhage. No discrete hematoma. There is no extra-axial fluid collection. Ventricles and sulci are stable in size and configuration. Vascular: There is atherosclerotic calcification at the skull base. Skull: Calvarium is unremarkable. Sinuses/Orbits: No acute finding. Other: None. IMPRESSION: Motion degraded study. Right basal ganglia infarction on recent CT and MR imaging is not as well seen, which may reflect subacute chronicity  and petechial hemorrhage. However, there is no discrete hematoma. No other apparent acute abnormality. Electronically Signed   By: Guadlupe Spanish M.D.   On: 02/08/2021 17:59    Procedures Procedures   Medications Ordered in ED Medications  sodium chloride  0.9 % bolus 500 mL (0 mLs Intravenous Stopped 02/08/21 2035)    ED Course  I have reviewed the triage vital signs and the nursing notes.  Pertinent labs & imaging results that were available during my care of the patient were reviewed by me and considered in my medical decision making (see chart for details).    MDM Rules/Calculators/A&P                          Alert 75 year old female no acute distress, nontoxic-appearing.  Patient is blind and deaf and unable to answer questions, level 5 caveat applies.  Patient is able to speak in full complete sentences.    Patient is on a bedside.  He reports that patient had a syncopal episode after eating a meal.  Single episode occurred while patient was sitting.  Patient did not fall, no tremors, no seizures, no nausea or focal neurological deficits noted.  Syncopal episode lasted for "a few minutes," and upon regaining consciousness patient was lethargic.  Patient reported that she was having generalized weakness to both her arms and legs earlier today however patient's son states this is baseline for her.  Difficult to complete neurological assessment due to patient being blind and deaf however no asymmetry noted, strength equal, +5 strength to bilateral upper extremities, patient able to lift both legs and hold against gravity without difficulty, +5 strength plantar flexion dorsiflexion, no pronator drift.  CBG obtained on arrival 75.  On arrival blood pressure 146/46.  Will obtain EKG, chest x-ray, CT head without contrast, TSH, urinalysis, CBC, CMP, ammonia.  EKG shows sinus rhythm with no significant change from previous tracing. Chest x-ray shows no active cardiopulmonary disease. CT  head without contrast shows Motion degraded study. Right basal ganglia infarction on recent CT and MR imaging is not as well seen, which may reflect subacute chronicity and petechial hemorrhage. However, there is no discrete hematoma. No other apparent acute abnormality. CBC is unremarkable. Ammonia is within normal limits. CMP shows creatinine elevated at 1.16 this is increased from 1.05 weeks prior. UA shows no signs of infection. TSH less than 0.010  On serial reexamination patient focal neurological deficits.  Patient has no further syncopal episodes.  Unclear exact etiology of patient's syncope.  Possible postprandial hypotension versus vagal response versus transient abnormal heart rhythm.  We will have patient follow-up with her primary care provider regarding TSH levels.  Patient has follow up with cardiology and is enrolled to receive a 30-day cardiac event monitor sent to her house.  Discussed all results with patient and son at bedside.  Discussed follow-up and return precautions with patient's son.  Patient's son expressed understanding of all instructions and is agreeable with this plan.       Final Clinical Impression(s) / ED Diagnoses Final diagnoses:  Syncope, unspecified syncope type    Rx / DC Orders ED Discharge Orders    None       Berneice HeinrichBadalamente, Rikia Sukhu R, PA-C 02/09/21 0334    Benjiman CorePickering, Nathan, MD 02/10/21 [phone]

## 2021-02-08 NOTE — ED Triage Notes (Signed)
Pt was sitting at table and she looked as if she was sleeping, as if leaning her head on her elbow, however she would not respond to family.  When EMS arrived, pt was alert, but lethargic.  BP of 70/palp.  Given 500 ns en-route with an increase to 108/60.  Pt is deaf and blind.  Family communicates by having pt ask questions and they tap 1 for yes and 2 for no.  Pt speaking in complete sentences.    Hr 50 bp 108/60 cbg 102 spoc 100% rr 16

## 2021-02-08 NOTE — ED Notes (Signed)
Pt d/c by MD and is provided w/ d/c instructions and follow up care  

## 2021-02-08 NOTE — Discharge Instructions (Signed)
You came to the emergency department today to be evaluated for an episode of syncope.  Your physical exam, lab work, and CT scan were reassuring.  Your TSH was found to be low, please follow-up with your primary care provider to make sure that you are thyroid medication is appropriate.  Please follow-up with your primary care doctor about your episode of passing out today.  Get help right away if you: Have a severe headache. Faint once or repeatedly. Have pain in your chest, abdomen, or back. Have a very fast or irregular heartbeat (palpitations). Have pain when you breathe. Are bleeding from your mouth or rectum, or you have black or tarry stool. Have a seizure. Are confused. Have trouble walking. Have severe weakness. Have vision problems.

## 2021-02-19 ENCOUNTER — Institutional Professional Consult (permissible substitution): Payer: Medicare Other | Admitting: Cardiology

## 2021-02-19 NOTE — Progress Notes (Deleted)
Electrophysiology Office Note   Date:  02/19/2021   ID:  [name], DOB 1946/01/22, MRN [id number]  PCP:  Doristine Bosworth, MD  Cardiologist:  *** Primary Electrophysiologist: *** Iwalani Templeton Jorja Loa, MD    Chief Complaint: ***   History of Present Illness: [name] is a 75 y.o. female who is being seen today for the evaluation of *** at the request of Turner, Cornelious Bryant, MD. Presenting today for electrophysiology evaluation.    Today, she denies*** symptoms of palpitations, chest pain, shortness of breath, orthopnea, PND, lower extremity edema, claudication, dizziness, presyncope, syncope, bleeding, or neurologic sequela. The patient is tolerating medications without difficulties.    Past Medical History:  Diagnosis Date  . Adrenal insufficiency (Addison's disease) (HCC)   . 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 Hypertension   . Syphilis 01/28/2020  . Thyroid disease    hypothyroidism  . Type 2 diabetes mellitus (HCC)    Past Surgical History:  Procedure Laterality Date  . EYE SURGERY    . FEMUR IM NAIL Left 02/02/2018  . FEMUR IM NAIL Left 02/02/2018   Procedure: INTRAMEDULLARY (IM) NAIL LEFT FEMUR;  Surgeon: Roby Lofts, MD;  Location: [facility] OR;  Service: Orthopedics;  Laterality: Left;     Current Outpatient Medications  Medication Sig Dispense Refill  . amLODipine (NORVASC) 5 MG tablet Take 1 tablet (5 mg total) by mouth daily with breakfast. 30 tablet 1  . aspirin EC 81 MG tablet Take 1 tablet (81 mg total) by mouth daily. Swallow whole. 150 tablet 2  . hydrocortisone (CORTEF) 10 MG tablet Take 1 tablet (10 mg total) by mouth in the morning AND 0.5 tablets (5 mg total) daily in the afternoon. 135 tablet 3  . levothyroxine (SYNTHROID) 75 MCG tablet Take 1 tablet (75 mcg total) by mouth daily. 90 tablet 3  . Multiple Vitamins-Minerals (CENTRUM SILVER 50+WOMEN) TABS Take 1 tablet by mouth daily.    . polyethylene glycol (MIRALAX / GLYCOLAX)  packet Take 17 g by mouth 2 (two) times daily. (until having soft bowel movement daily, then use as needed or daily) 14 each 0  . prednisoLONE acetate (PRED FORTE) 1 % ophthalmic suspension INSTILL 1 DROP INTO EACH EYE TWICE DAILY 5 mL 6  . Propylene Glycol (SYSTANE BALANCE OP) Apply 2 drops to eye 3 (three) times daily as needed (dry eyes).     . rosuvastatin (CRESTOR) 20 MG tablet Take 1 tablet (20 mg total) by mouth daily. 90 tablet 1  . sodium chloride (MURO 128) 2 % ophthalmic solution Place 1 drop into both eyes 2 (two) times daily. 15 mL 0   No current facility-administered medications for this visit.    Allergies:   Patient has no known allergies.   Social History:  The patient  reports that she has quit smoking. She has never used smokeless tobacco. She reports that she does not drink alcohol and does not use drugs.   Family History:  The patient's ***family history includes Vitiligo in her son.    ROS:  Please see the history of present illness.   Otherwise, review of systems is positive for none.   All other systems are reviewed and negative.    PHYSICAL EXAM: VS:  There were no vitals taken for this visit. , BMI There is no height or weight on file to calculate BMI. GEN: Well nourished, well developed, in no acute distress  HEENT: normal  Neck: no JVD, carotid bruits, or masses  Cardiac: ***RRR; no murmurs, rubs, or gallops,no edema  Respiratory:  clear to auscultation bilaterally, normal work of breathing GI: soft, nontender, nondistended, + BS MS: no deformity or atrophy  Skin: warm and dry, ***device pocket is well healed Neuro:  Strength and sensation are intact Psych: euthymic mood, full affect  EKG:  EKG {ACTION; IS/IS [id number]} ordered today. Personal review of the ekg ordered *** shows ***  ***Device interrogation is reviewed today in detail.  See PaceArt for details.   Recent Labs: 02/08/2021: ALT 24; BUN 13; Creatinine, Ser 1.16; Hemoglobin 11.8;  Platelets 212; Potassium 3.6; Sodium 143; TSH <0.010    Lipid Panel     Component Value Date/Time   CHOL 179 01/17/2021 0323   CHOL 197 12/31/2019 1002   TRIG 79 01/17/2021 0323   HDL 43 01/17/2021 0323   HDL 52 12/31/2019 1002   CHOLHDL 4.2 01/17/2021 0323   VLDL 16 01/17/2021 0323   LDLCALC 120 (H) 01/17/2021 0323   LDLCALC 122 (H) 12/31/2019 1002     Wt Readings from Last 3 Encounters:  02/08/21 130 lb 1.1 oz (59 kg)  01/15/21 130 lb (59 kg)  11/21/20 141 lb (64 kg)      Other studies Reviewed: Additional studies/ records that were reviewed today include: TTE 01/16/21  Review of the above records today demonstrates:  1. Left ventricular ejection fraction, by estimation, is 70 to 75%. The  left ventricle has hyperdynamic function. The left ventricle has no  regional wall motion abnormalities. There is moderate asymmetric left  ventricular hypertrophy of the basal-septal  segment. Left ventricular diastolic parameters are consistent with Grade  I diastolic dysfunction (impaired relaxation).  2. Right ventricular systolic function is normal. The right ventricular  size is normal. Tricuspid regurgitation signal is inadequate for assessing  PA pressure.  3. The mitral valve is normal in structure. No evidence of mitral valve  regurgitation.  4. The aortic valve was not well visualized. Aortic valve regurgitation  is not visualized. No aortic stenosis is present.  5. The inferior vena cava is normal in size with greater than 50%  respiratory variability, suggesting right atrial pressure of 3 mmHg.    ASSESSMENT AND PLAN:  1.  ***    Current medicines are reviewed at length with the patient today.   The patient {ACTIONS; HAS/DOES NOT HAVE:19233} concerns regarding her medicines.  The following changes were made today:  {NONE DEFAULTED:18576::"none"}  Labs/ tests ordered today include: *** No orders of the defined types were placed in this  encounter.    Disposition:   FU with Aurther Harlin {gen number [id number]} {Days to years:10300}  Signed, Dyan Labarbera Jorja Loa, MD  02/19/2021 8:41 AM     [facility] [address] [place] [address] [phone] (office) [phone] (fax)

## 2021-03-04 ENCOUNTER — Ambulatory Visit: Payer: Medicare Other | Admitting: Cardiology

## 2021-03-04 ENCOUNTER — Other Ambulatory Visit: Payer: Self-pay | Admitting: Internal Medicine

## 2021-03-04 DIAGNOSIS — E2740 Unspecified adrenocortical insufficiency: Secondary | ICD-10-CM

## 2021-03-22 ENCOUNTER — Other Ambulatory Visit: Payer: Self-pay | Admitting: Internal Medicine

## 2021-03-24 NOTE — Telephone Encounter (Signed)
D/C by MD

## 2021-04-20 ENCOUNTER — Inpatient Hospital Stay: Payer: Medicare Other | Admitting: Neurology

## 2021-05-01 ENCOUNTER — Encounter: Payer: Self-pay | Admitting: Cardiology

## 2021-05-01 ENCOUNTER — Ambulatory Visit: Payer: Medicare Other

## 2021-05-01 ENCOUNTER — Ambulatory Visit (INDEPENDENT_AMBULATORY_CARE_PROVIDER_SITE_OTHER): Payer: Medicare Other | Admitting: Cardiology

## 2021-05-01 ENCOUNTER — Other Ambulatory Visit: Payer: Self-pay

## 2021-05-01 VITALS — BP 122/60 | HR 65 | Ht 60.0 in | Wt 136.0 lb

## 2021-05-01 DIAGNOSIS — I10 Essential (primary) hypertension: Secondary | ICD-10-CM | POA: Diagnosis not present

## 2021-05-01 DIAGNOSIS — R55 Syncope and collapse: Secondary | ICD-10-CM

## 2021-05-01 DIAGNOSIS — I639 Cerebral infarction, unspecified: Secondary | ICD-10-CM

## 2021-05-01 NOTE — Progress Notes (Unsigned)
Enrolled patient for a 14 day Zio XT  monitor to be mailed to patients home  °

## 2021-05-01 NOTE — Progress Notes (Signed)
Cardiology CONSULT Note    Date:  05/01/2021   ID:  [name], DOB 09/14/1946, MRN [id number]  PCP:  Pcp, No  Cardiologist:  Armanda Magic, MD   No chief complaint on file.   History of Present Illness:  [name] is a 75 y.o. female who is being seen today for the evaluation of Syncope at the request of Doristine Bosworth, MD.  This is a 75yo female with a hx of Addison's disease, HTN, DM2, CVA and Hypothyroidism.  She is deaf and blind and communicates through finger taps with her family.  Family is with her today.  She recently was seen in the ER for a syncopal episode.  Apparently the morning of the event her son came by to see her and she appeared fine.  He made her a meal but after completing her meal she became unresponsive while sitting down with her head tilted back and was unresponsive for a few minutes. After regaining consciousness she was lethargic.  There was no witnessed seizure activity.  In ER she was hypotensive which responded to NS bolus.Labs showed SCr 1.16, Hbg 11.8 and TSH < 0.01 and EKG showed Sinsu bradycardia with no ST changes.  She was sent home.    2D echo outpt showed normal LVF with EF 70% with moderate ASSH and no valvular heart disease. Carotid dopplers showed 1-39% right and 60-79% left carotid stenosis. She is now here for heart monitor evaluation.  Of note, a month prior she had been admitted for a cryptogenic CVA. She was supposed to wear a heart monitor after this but it never was done.   She is here with her son and he provides all the history.   She denies any chest pain or pressure, SOB, DOE, PND, orthopnea, LE edema, dizziness, palpitations or syncope. She is compliant with her meds and is tolerating meds with no SE.      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   aspirin EC 81 MG tablet Take 1 tablet (81 mg total) by mouth daily. Swallow whole.   hydrocortisone (CORTEF) 10 MG tablet TAKE 1 TABLET BY MOUTH IN THE MORNING AND 1/2 TABLET DAILY IN THE AFTERNOON   levothyroxine (SYNTHROID) 75 MCG tablet Take 1 tablet (75 mcg total) by mouth daily.   Multiple Vitamins-Minerals (CENTRUM SILVER 50+WOMEN) TABS Take 1 tablet by mouth daily.    Allergies:   Patient has no known allergies.   Social History   Socioeconomic History   Marital status: Divorced    Spouse name: Not on file   Number of children: Not on file   Years of education: Not on file   Highest education level: Not on file  Occupational History   Not on file  Tobacco Use  Smoking status: Former   Smokeless tobacco: Never  Building services engineer Use: Never used  Substance and Sexual Activity   Alcohol use: Never   Drug use: Never   Sexual activity: Not on file  Other Topics Concern   Not on file  Social History Narrative   Not on file   Social Determinants of Health   Financial Resource Strain: Not on file  Food Insecurity: Not on file  Transportation Needs: Not on file  Physical Activity: Not on file  Stress: Not on file  Social Connections: Not on file     Family History:  The patient's family history includes Vitiligo in her son.   ROS:   Please see the history of present illness.    ROS All other systems reviewed and are negative.  No flowsheet data found.     PHYSICAL EXAM:   VS:  BP 122/60   Pulse 65    Ht 5' (1.524 m)   Wt 136 lb (61.7 kg)   SpO2 98%   BMI 26.56 kg/m    GEN: Well nourished, well developed, in no acute distress  HEENT: normal  Neck: no JVD, carotid bruits, or masses Cardiac: RRR; no murmurs, rubs, or gallops,no edema.  Intact distal pulses bilaterally.  Respiratory:  clear to auscultation bilaterally, normal work of breathing GI: soft, nontender, nondistended, + BS MS: no deformity or atrophy  Skin: warm and dry, no rash Neuro:  Alert and Oriented x 3, Strength and sensation are intact Psych: euthymic mood, full affect  Wt Readings from Last 3 Encounters:  05/01/21 136 lb (61.7 kg)  02/08/21 130 lb 1.1 oz (59 kg)  01/15/21 130 lb (59 kg)      Studies/Labs Reviewed:   EKG:  EKG is not ordered today.    Recent Labs: 02/08/2021: ALT 24; BUN 13; Creatinine, Ser 1.16; Hemoglobin 11.8; Platelets 212; Potassium 3.6; Sodium 143; TSH <0.010   Lipid Panel    Component Value Date/Time   CHOL 179 01/17/2021 0323   CHOL 197 12/31/2019 1002   TRIG 79 01/17/2021 0323   HDL 43 01/17/2021 0323   HDL 52 12/31/2019 1002   CHOLHDL 4.2 01/17/2021 0323   VLDL 16 01/17/2021 0323   LDLCALC 120 (H) 01/17/2021 0323   LDLCALC 122 (H) 12/31/2019 1002    Additional studies/ records that were reviewed today include:  ER notes, labs from ER, EKG, 2D echo and carotid dopplers    ASSESSMENT:    1. Syncope and collapse   2. Primary hypertension      PLAN:  In order of problems listed above:  Syncope -this occurred while sitting down after eating. -she had not felt well in the am and had not eaten until her son came by after lunch to feed her -apparently she had not eaten anything earlier that am so ? Low BS -She denies any hx of chest pain, dizziness, palpitations  -she has had 3 episodes of profound weakness in the past and then syncope -EKG is nonischemic -2D echo is normal except for moderate ASSH -orthostatic BPs in the office today  -I have recommended event  monitor to assess for arrhythmias>>if this is negative and she has any further syncopal events, then recommend ILR.  2.  HTN -BP controlled on exam today -Continue prescription drug management with amlodipine 5mg  daily    Time Spent: 25 minutes total time of encounter, including 20 minutes spent in face-to-face patient care on the date of this encounter.  This time includes coordination of care and counseling regarding above mentioned problem list. Remainder of non-face-to-face time involved reviewing chart documents/testing relevant to the patient encounter and documentation in the medical record. I have independently reviewed documentation from referring provider  Medication Adjustments/Labs and Tests Ordered: Current medicines are reviewed at length with the patient today.  Concerns regarding medicines are outlined above.  Medication changes, Labs and Tests ordered today are listed in the Patient Instructions below.  There are no Patient Instructions on file for this visit.   Signed, Armanda Magic, MD  05/01/2021 8:16 AM    [facility] [address] [place], [place], [place]  [address] Phone: [phone]; Fax: [phone]

## 2021-05-01 NOTE — Addendum Note (Signed)
Addended by: Theresia Majors on: 05/01/2021 08:39 AM   Modules accepted: Orders

## 2021-05-01 NOTE — Patient Instructions (Addendum)
Medication Instructions:  Your physician recommends that you continue on your current medications as directed. Please refer to the Current Medication list given to you today.  *If you need a refill on your cardiac medications before your next appointment, please call your pharmacy*   Testing/Procedures: Your physician has recommended that you wear an event monitor. Event monitors are medical devices that record the heart's electrical activity. Doctors most often [place] these monitors to diagnose arrhythmias. Arrhythmias are problems with the speed or rhythm of the heartbeat. The monitor is a small, portable device. You can wear one while you do your normal daily activities. This is usually used to diagnose what is causing palpitations/syncope (passing out).   Follow-Up: At [facility], you and your health needs are our priority.  As part of our continuing mission to provide you with exceptional heart care, we have created designated Provider Care Teams.  These Care Teams include your primary Cardiologist (physician) and Advanced Practice Providers (APPs -  Physician Assistants and Nurse Practitioners) who all work together to provide you with the care you need, when you need it.   Follow up as needed based on results of testing  ZIO XT- Long Term Monitor Instructions  Your physician has requested you wear a ZIO patch monitor for 14 days.  This is a single patch monitor. [employer] supplies one patch monitor per enrollment. Additional stickers are not available. Please do not apply patch if you will be having a Nuclear Stress Test,  Echocardiogram, Cardiac CT, MRI, or Chest Xray during the period you would be wearing the  monitor. The patch cannot be worn during these tests. You cannot remove and re-apply the  ZIO XT patch monitor.  Your ZIO patch monitor will be mailed 3 day USPS to your address on file. It may take 3-5 days  to receive your monitor after you have been enrolled.  Once you have  received your monitor, please review the enclosed instructions. Your monitor  has already been registered assigning a specific monitor serial # to you.  Billing and Patient Assistance Program Information  We have supplied [employer] with any of your insurance information on file for billing purposes. [employer] offers a sliding scale Patient Assistance Program for patients that do not have  insurance, or whose insurance does not completely cover the cost of the ZIO monitor.  You must apply for the Patient Assistance Program to qualify for this discounted rate.  To apply, please call [employer] at [phone], select option 4, select option 2, ask to apply for  Patient Assistance Program. Meredeth Ide will ask your household income, and how many people  are in your household. They will quote your out-of-pocket cost based on that information.  [employer] will also be able to set up a 47-month, interest-free payment plan if needed.  Applying the monitor   Shave hair from upper left chest.  Hold abrader disc by orange tab. Rub abrader in 40 strokes over the upper left chest as  indicated in your monitor instructions.  Clean area with 4 enclosed alcohol pads. Let dry.  Apply patch as indicated in monitor instructions. Patch will be placed under collarbone on left  side of chest with arrow pointing upward.  Rub patch adhesive wings for 2 minutes. Remove white label marked "1". Remove the white  label marked "2". Rub patch adhesive wings for 2 additional minutes.  While looking in a mirror, press and release button in center of patch. A small green light will  flash  3-4 times. This will be your only indicator that the monitor has been turned on.  Do not shower for the first 24 hours. You may shower after the first 24 hours.  Press the button if you feel a symptom. You will hear a small click. Record Date, Time and  Symptom in the Patient Logbook.  When you are ready to remove the patch, follow instructions on  the last 2 pages of Patient  Logbook. Stick patch monitor onto the last page of Patient Logbook.  Place Patient Logbook in the blue and white box. Use locking tab on box and tape box closed  securely. The blue and white box has prepaid postage on it. Please place it in the mailbox as  soon as possible. Your physician should have your test results approximately 7 days after the  monitor has been mailed back to [facility].  Call [facility] Customer Care at [phone] if you have questions regarding  your ZIO XT patch monitor. Call them immediately if you see an orange light blinking on your  monitor.  If your monitor falls off in less than 4 days, contact our Monitor department at [phone].  If your monitor becomes loose or falls off after 4 days call [employer] at [phone] for  suggestions on securing your monitor

## 2021-05-27 ENCOUNTER — Ambulatory Visit: Payer: Medicare Other | Admitting: Internal Medicine

## 2021-05-28 ENCOUNTER — Telehealth: Payer: Self-pay | Admitting: Cardiology

## 2021-05-28 ENCOUNTER — Telehealth: Payer: Self-pay | Admitting: Internal Medicine

## 2021-05-28 NOTE — Telephone Encounter (Signed)
Called [facility] ([facility])--gave last PCP info regarding death certificate Dr. Creta Levin [phone].

## 2021-05-28 NOTE — Telephone Encounter (Signed)
Irving Burton with [facility] ph# [phone] called re: Funeral Home needs Dr. Harvel Ricks [place] DAVE number because Dr. Lonzo Cloud has not initiated an account in the [facility] DAVE System re: Assigning Death Certificate for Katty Fretwell MRN:  [id number]  to Dr. Lonzo Cloud

## 2021-05-28 NOTE — Telephone Encounter (Signed)
Returned call to [place] with funeral services.  Advised Dr. Brennan Bailey is not available until next Monday.  Irving Burton states please send and they will wait to hear back from her.  Forwarding to DR. Turner.

## 2021-05-28 NOTE — Telephone Encounter (Signed)
Irving Burton w/ [facility] is stating that pts PCP declined to sign the pts death certificate and would like to know if Dr. Mayford Knife would be willing to... please advise

## 2021-06-01 NOTE — Telephone Encounter (Signed)
Spoke with Irving Burton and advised her that Dr. Mayford Knife would not sign death certificate for the patient.

## 2021-06-11 NOTE — Progress Notes (Deleted)
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  She has followed with [facility] since 02/14/2020 for consultative assistance with management of her hypopituitarism    HISTORICAL SUMMARY:   In review of her records she has been diagnosed with hypothyroidism > 20 yrs ago, she was on LT-4 replacement until 06/2019 when this was discontinued due to persistently low TSH.  Pt noted thyromegaly while off levothyroxine and had restarted taking her old prescription three times a week which has helped with thyromegaly.      She has been diagnosed with adrenal insufficiency sometime between 2005 and 2019 as this appeared in her chart in 2019 when she was admitted for a fall . She is on  HC 20 mg tablets , taking half a tablet once  daily    She has no head injury or prior exposure to radiation but was admitted in 2005 with septic shock after being admitted for AMS and fever, requiring intubation    Pt was restarted on LT-4 replacement in 02/2020 with a FT4 of 0.35 ng/dL , prolactin was undetectable at < 0.1 ng/mL, as well as an ACTH < 5 pg/mL,  with inappropriately normal  LH and FSH.   IGF-a level was elevated at 261 ng/mL but acromegaly was ruled out with undetectable growth hormone following a 75 g- oral glucose tolerance test  Hydrocortisone has been adjusted    Son  with thyroid disease  SUBJECTIVE:     Today (06/12/21):  [name] is here for a follow up on panhypopituitarism . Pt with visual and hearing impairment. 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 She has never used smokeless tobacco. She reports that she does not drink alcohol and does not use drugs. Family History:  Family History  Problem Relation Age of Onset   Vitiligo Son      HOME MEDICATIONS: Allergies as of June 12, 2021   No Known Allergies      Medication List        Accurate as of 06-12-2021  7:16 AM. If you have any questions, ask your nurse or doctor.          amLODipine 5 MG tablet Commonly known as: NORVASC Take 1 tablet (5 mg total) by mouth daily with breakfast.   aspirin EC 81 MG tablet Take 1 tablet (81 mg total) by mouth daily. Swallow whole.   Centrum Silver 50+Women Tabs Take 1 tablet by mouth daily.   hydrocortisone 10 MG tablet Commonly known as: CORTEF TAKE 1 TABLET BY MOUTH IN THE MORNING AND 1/2 TABLET DAILY IN THE AFTERNOON   levothyroxine 75 MCG tablet Commonly known as: SYNTHROID Take 1 tablet (75 mcg total) by mouth daily.   polyethylene glycol 17 g packet Commonly known as: MIRALAX / GLYCOLAX Take 17 g by mouth 2 (two) times daily. (until having soft bowel movement daily, then  use as needed or daily)   prednisoLONE acetate 1 % ophthalmic suspension Commonly known as: PRED FORTE INSTILL 1 DROP INTO EACH EYE TWICE DAILY   rosuvastatin 20 MG tablet Commonly known as: CRESTOR Take 1 tablet (20 mg total) by mouth daily.   sodium chloride 2 % ophthalmic solution Commonly known as: MURO 128 Place 1 drop into both eyes 2 (two) times daily.   SYSTANE BALANCE OP Apply 2 drops to eye 3 (three) times daily as needed (dry eyes).          OBJECTIVE:   PHYSICAL EXAM: JS:EGBTD were no vitals taken for this visit.  EXAM: General: Pt appears well and is in NAD  Neck: General: Supple without adenopathy. Thyroid: Thyroid size normal.  No goiter or nodules appreciated.   Lungs: Clear with good BS bilat with no rales, rhonchi, or wheezes  Heart: Auscultation: RRR.  Abdomen: Normoactive bowel sounds, soft, nontender, without masses or organomegaly palpable  Extremities:  BL LE: No pretibial edema normal ROM and strength.     DATA REVIEWED:  Results for Joanne, Tapia (MRN [id number]) as of 11/24/2020 09:02  Ref. Range 11/21/2020 10:43  Sodium Latest Ref Range: 135 - 145 mEq/L 143  Potassium Latest Ref Range: 3.5 - 5.1 mEq/L 4.5  Chloride Latest Ref Range: 96 - 112 mEq/L 105  CO2 Latest Ref Range: 19 - 32 mEq/L 33 (H)  Glucose Latest Ref Range: 70 - 99 mg/dL 99  BUN Latest Ref Range: 6 - 23 mg/dL 18  Creatinine Latest Ref Range: 0.40 - 1.20 mg/dL 1.06  Calcium Latest Ref Range: 8.4 - 10.5 mg/dL 9.5  GFR Latest Ref Range: >60.00 mL/min 47.46 (L)  T4,Free(Direct) Latest Ref Range: 0.60 - 1.60 ng/dL 2.69     ASSESSMENT / PLAN / RECOMMENDATIONS:   Panhypopituitarism :   - Unclear the cause at this time but seems to be chronic , not sure if this had to do with her previous episode of meningitis from 2005. No prior hx of head trauma or radiation.  - She had a CT scan of the brain in 03/2019 with no mention of pituitary pathology - Her FSH, LH , ACTH  And  prolactin are abnormally low  - She initially had elevated IGF-1 level but growth hormone was undetectable with oral glucose tolerance test      2. Secondary Hypothyroidism:    - Pt is clinically euthyroid  - TSH should NOT  be used in making any LT- 4 replacement adjustments due to hypopituitarism - Will continue LT- 4 replacement as below   - FT4 level is normal today    Medications : Continue  Levothyroxine  75 mcg daily        3. Secondary Adrenal Insufficiency :    - I have explained to the son the importance of HC replacement, when to take it , we also discussed the sick day rules and that insufficient intake of hydrocortisone could be fatal to the pt  - I have asked him to obtain a medical alert bracelet   - BMP normal with stable low GFR   Medications:   Hydrocortisone 10 mg with breakfast and 5 mg between 2-4 pm         F/U in 6 months      Signed electronically by: Lyndle Herrlich, MD  [facility] End[facility]  [facility] [address] [place] [place], [place] [address] Phone: [phone] FAX: [phone]      CC: Pcp, No No address on file Phone: None  Fax: None   Return to Endocrinology clinic as below: Future Appointments  Date Time Provider Department Center  06-02-21 10:10 AM Ruvim Risko, Konrad Dolores, MD [id number] None  08/12/2021  1:30 PM Micki Riley, MD [id number] None

## 2021-06-11 DEATH — deceased

## 2021-08-12 ENCOUNTER — Inpatient Hospital Stay: Payer: Medicare Other | Admitting: Neurology

## 2023-08-02 ENCOUNTER — Other Ambulatory Visit (HOSPITAL_COMMUNITY): Payer: Self-pay
